# Patient Record
Sex: Male | Born: 1950
Health system: Southern US, Community
[De-identification: ages and names within clinical notes are randomized; demographics above are authoritative.]

## PROBLEM LIST (undated history)

## (undated) DIAGNOSIS — K5090 Crohn's disease, unspecified, without complications: Secondary | ICD-10-CM

## (undated) DIAGNOSIS — IMO0002 Reserved for concepts with insufficient information to code with codable children: Secondary | ICD-10-CM

## (undated) DIAGNOSIS — C4491 Basal cell carcinoma of skin, unspecified: Secondary | ICD-10-CM

## (undated) DIAGNOSIS — E538 Deficiency of other specified B group vitamins: Secondary | ICD-10-CM

## (undated) DIAGNOSIS — H8390 Unspecified disease of inner ear, unspecified ear: Secondary | ICD-10-CM

## (undated) DIAGNOSIS — T7840XA Allergy, unspecified, initial encounter: Secondary | ICD-10-CM

## (undated) DIAGNOSIS — K9089 Other intestinal malabsorption: Secondary | ICD-10-CM

## (undated) DIAGNOSIS — N200 Calculus of kidney: Secondary | ICD-10-CM

## (undated) DIAGNOSIS — I10 Essential (primary) hypertension: Secondary | ICD-10-CM

## (undated) DIAGNOSIS — H9190 Unspecified hearing loss, unspecified ear: Secondary | ICD-10-CM

## (undated) DIAGNOSIS — Z8719 Personal history of other diseases of the digestive system: Secondary | ICD-10-CM

## (undated) DIAGNOSIS — D649 Anemia, unspecified: Secondary | ICD-10-CM

## (undated) DIAGNOSIS — K909 Intestinal malabsorption, unspecified: Secondary | ICD-10-CM

## (undated) DIAGNOSIS — L409 Psoriasis, unspecified: Secondary | ICD-10-CM

## (undated) HISTORY — PX: KIDNEY STONE SURGERY: SHX686

## (undated) HISTORY — DX: Basal cell carcinoma of skin, unspecified: C44.91

## (undated) HISTORY — DX: Psoriasis, unspecified: L40.9

## (undated) HISTORY — DX: Intestinal malabsorption, unspecified: K90.9

## (undated) HISTORY — DX: Unspecified hearing loss, unspecified ear: H91.90

## (undated) HISTORY — DX: Unspecified disease of inner ear, unspecified ear: H83.90

## (undated) HISTORY — DX: Essential (primary) hypertension: I10

## (undated) HISTORY — DX: Personal history of other diseases of the digestive system: Z87.19

## (undated) HISTORY — PX: CATARACT EXTRACTION, BILATERAL: SHX1313

## (undated) HISTORY — DX: Crohn's disease, unspecified, without complications: K50.90

## (undated) HISTORY — PX: MOUTH SURGERY: SHX715

## (undated) HISTORY — DX: Other intestinal malabsorption: K90.89

## (undated) HISTORY — DX: Anemia, unspecified: D64.9

## (undated) HISTORY — DX: Reserved for concepts with insufficient information to code with codable children: IMO0002

## (undated) HISTORY — DX: Deficiency of other specified B group vitamins: E53.8

## (undated) HISTORY — DX: Calculus of kidney: N20.0

## (undated) HISTORY — DX: Allergy, unspecified, initial encounter: T78.40XA

## (undated) HISTORY — PX: MOLE REMOVAL: SHX2046

---

## 1987-06-26 DIAGNOSIS — K5090 Crohn's disease, unspecified, without complications: Secondary | ICD-10-CM | POA: Insufficient documentation

## 1987-06-26 HISTORY — PX: ILEOCECETOMY: SHX5857

## 1988-06-25 DIAGNOSIS — K260 Acute duodenal ulcer with hemorrhage: Secondary | ICD-10-CM | POA: Insufficient documentation

## 1997-10-23 LAB — FECAL OCCULT BLOOD, GUAIAC: Fecal Occult Blood: NEGATIVE

## 1997-11-02 ENCOUNTER — Encounter: Payer: Self-pay | Admitting: Internal Medicine

## 1998-01-03 ENCOUNTER — Encounter: Payer: Self-pay | Admitting: Internal Medicine

## 1999-03-21 ENCOUNTER — Encounter: Payer: Self-pay | Admitting: Internal Medicine

## 2000-05-25 DIAGNOSIS — D518 Other vitamin B12 deficiency anemias: Secondary | ICD-10-CM

## 2000-05-29 ENCOUNTER — Encounter: Payer: Self-pay | Admitting: Internal Medicine

## 2000-06-06 ENCOUNTER — Other Ambulatory Visit: Admission: RE | Admit: 2000-06-06 | Discharge: 2000-06-06 | Payer: Self-pay | Admitting: Internal Medicine

## 2000-06-06 ENCOUNTER — Encounter: Payer: Self-pay | Admitting: Internal Medicine

## 2000-06-06 ENCOUNTER — Encounter (INDEPENDENT_AMBULATORY_CARE_PROVIDER_SITE_OTHER): Payer: Self-pay | Admitting: *Deleted

## 2000-06-06 ENCOUNTER — Encounter (INDEPENDENT_AMBULATORY_CARE_PROVIDER_SITE_OTHER): Payer: Self-pay | Admitting: Specialist

## 2000-07-18 ENCOUNTER — Encounter: Payer: Self-pay | Admitting: Internal Medicine

## 2001-07-03 ENCOUNTER — Encounter: Payer: Self-pay | Admitting: Internal Medicine

## 2002-07-02 ENCOUNTER — Encounter: Payer: Self-pay | Admitting: Internal Medicine

## 2003-07-02 ENCOUNTER — Encounter: Payer: Self-pay | Admitting: Internal Medicine

## 2004-05-04 ENCOUNTER — Ambulatory Visit: Payer: Self-pay | Admitting: Family Medicine

## 2004-05-25 HISTORY — PX: SEPTOPLASTY: SUR1290

## 2004-06-05 ENCOUNTER — Ambulatory Visit: Payer: Self-pay | Admitting: Family Medicine

## 2004-06-21 ENCOUNTER — Ambulatory Visit: Payer: Self-pay | Admitting: Otolaryngology

## 2004-06-30 ENCOUNTER — Ambulatory Visit: Payer: Self-pay | Admitting: Family Medicine

## 2004-08-03 ENCOUNTER — Ambulatory Visit: Payer: Self-pay | Admitting: Family Medicine

## 2004-08-14 ENCOUNTER — Ambulatory Visit: Payer: Self-pay | Admitting: Internal Medicine

## 2004-09-05 ENCOUNTER — Ambulatory Visit: Payer: Self-pay | Admitting: Family Medicine

## 2004-09-29 ENCOUNTER — Ambulatory Visit: Payer: Self-pay | Admitting: Family Medicine

## 2004-10-30 ENCOUNTER — Ambulatory Visit: Payer: Self-pay | Admitting: Family Medicine

## 2004-11-30 ENCOUNTER — Ambulatory Visit: Payer: Self-pay | Admitting: Family Medicine

## 2005-01-01 ENCOUNTER — Ambulatory Visit: Payer: Self-pay | Admitting: Family Medicine

## 2005-02-01 ENCOUNTER — Ambulatory Visit: Payer: Self-pay | Admitting: Family Medicine

## 2005-03-01 ENCOUNTER — Ambulatory Visit: Payer: Self-pay | Admitting: Family Medicine

## 2005-03-30 ENCOUNTER — Ambulatory Visit: Payer: Self-pay | Admitting: Family Medicine

## 2005-04-30 ENCOUNTER — Ambulatory Visit: Payer: Self-pay | Admitting: Family Medicine

## 2005-05-30 ENCOUNTER — Ambulatory Visit: Payer: Self-pay | Admitting: Family Medicine

## 2005-07-04 ENCOUNTER — Ambulatory Visit: Payer: Self-pay | Admitting: Family Medicine

## 2005-07-31 ENCOUNTER — Ambulatory Visit: Payer: Self-pay | Admitting: Family Medicine

## 2005-08-14 ENCOUNTER — Ambulatory Visit: Payer: Self-pay | Admitting: Internal Medicine

## 2005-08-29 ENCOUNTER — Ambulatory Visit: Payer: Self-pay | Admitting: Family Medicine

## 2005-10-03 ENCOUNTER — Ambulatory Visit: Payer: Self-pay | Admitting: Family Medicine

## 2005-10-25 ENCOUNTER — Ambulatory Visit: Payer: Self-pay | Admitting: Family Medicine

## 2005-11-01 ENCOUNTER — Ambulatory Visit: Payer: Self-pay | Admitting: Family Medicine

## 2005-11-07 ENCOUNTER — Ambulatory Visit: Payer: Self-pay | Admitting: Family Medicine

## 2005-11-22 ENCOUNTER — Ambulatory Visit: Payer: Self-pay | Admitting: Family Medicine

## 2005-12-07 ENCOUNTER — Ambulatory Visit: Payer: Self-pay | Admitting: Family Medicine

## 2005-12-12 ENCOUNTER — Ambulatory Visit: Payer: Self-pay | Admitting: Family Medicine

## 2005-12-12 ENCOUNTER — Ambulatory Visit: Payer: Self-pay | Admitting: Internal Medicine

## 2005-12-17 ENCOUNTER — Ambulatory Visit: Payer: Self-pay | Admitting: Internal Medicine

## 2005-12-17 LAB — HM COLONOSCOPY

## 2005-12-24 ENCOUNTER — Ambulatory Visit: Payer: Self-pay | Admitting: Family Medicine

## 2006-01-07 ENCOUNTER — Ambulatory Visit: Payer: Self-pay | Admitting: Family Medicine

## 2006-01-22 ENCOUNTER — Ambulatory Visit: Payer: Self-pay | Admitting: Family Medicine

## 2006-02-07 ENCOUNTER — Ambulatory Visit: Payer: Self-pay | Admitting: Family Medicine

## 2006-02-26 ENCOUNTER — Ambulatory Visit: Payer: Self-pay | Admitting: Family Medicine

## 2006-02-28 ENCOUNTER — Ambulatory Visit: Payer: Self-pay | Admitting: Family Medicine

## 2006-03-11 ENCOUNTER — Ambulatory Visit: Payer: Self-pay | Admitting: Family Medicine

## 2006-03-26 ENCOUNTER — Ambulatory Visit: Payer: Self-pay | Admitting: *Deleted

## 2006-04-09 ENCOUNTER — Ambulatory Visit: Payer: Self-pay | Admitting: Family Medicine

## 2006-04-24 ENCOUNTER — Ambulatory Visit: Payer: Self-pay | Admitting: Family Medicine

## 2006-05-14 ENCOUNTER — Ambulatory Visit: Payer: Self-pay | Admitting: Family Medicine

## 2006-05-27 ENCOUNTER — Ambulatory Visit: Payer: Self-pay | Admitting: Family Medicine

## 2006-06-11 ENCOUNTER — Ambulatory Visit: Payer: Self-pay | Admitting: Family Medicine

## 2006-06-26 ENCOUNTER — Ambulatory Visit: Payer: Self-pay | Admitting: Family Medicine

## 2006-07-11 ENCOUNTER — Ambulatory Visit: Payer: Self-pay | Admitting: Family Medicine

## 2006-07-29 ENCOUNTER — Ambulatory Visit: Payer: Self-pay | Admitting: Family Medicine

## 2006-08-16 ENCOUNTER — Ambulatory Visit: Payer: Self-pay | Admitting: Family Medicine

## 2006-09-12 ENCOUNTER — Encounter: Payer: Self-pay | Admitting: Family Medicine

## 2006-09-16 ENCOUNTER — Ambulatory Visit: Payer: Self-pay | Admitting: Family Medicine

## 2006-10-03 ENCOUNTER — Ambulatory Visit: Payer: Self-pay | Admitting: Family Medicine

## 2006-10-21 ENCOUNTER — Ambulatory Visit: Payer: Self-pay | Admitting: Family Medicine

## 2006-11-29 ENCOUNTER — Ambulatory Visit: Payer: Self-pay | Admitting: Internal Medicine

## 2006-11-29 LAB — CONVERTED CEMR LAB
ALT: 16 units/L (ref 0–40)
AST: 18 units/L (ref 0–37)
Albumin: 3.7 g/dL (ref 3.5–5.2)
Basophils Absolute: 0 10*3/uL (ref 0.0–0.1)
CRP, High Sensitivity: <1 — ABNORMAL LOW (ref 0.00–5.00)
Chloride: 108 meq/L (ref 96–112)
Creatinine, Ser: 0.9 mg/dL (ref 0.4–1.5)
Eosinophils Absolute: 0.2 10*3/uL (ref 0.0–0.6)
GFR calc Af Amer: 112 mL/min
Glucose, Bld: 84 mg/dL (ref 70–99)
HCT: 39.7 % (ref 39.0–52.0)
MCHC: 34.3 g/dL (ref 30.0–36.0)
MCV: 87.5 fL (ref 78.0–100.0)
Monocytes Relative: 12.3 % — ABNORMAL HIGH (ref 3.0–11.0)
Neutrophils Relative %: 67.3 % (ref 43.0–77.0)
Potassium: 4.3 meq/L (ref 3.5–5.1)
RBC: 4.54 M/uL (ref 4.22–5.81)
RDW: 14.4 % (ref 11.5–14.6)
Sed Rate: 10 mm/hr (ref 0–20)
Sodium: 140 meq/L (ref 135–145)
Total Bilirubin: 0.9 mg/dL (ref 0.3–1.2)

## 2006-12-06 ENCOUNTER — Ambulatory Visit: Payer: Self-pay | Admitting: Family Medicine

## 2006-12-24 ENCOUNTER — Ambulatory Visit: Payer: Self-pay | Admitting: Family Medicine

## 2007-01-09 ENCOUNTER — Ambulatory Visit: Payer: Self-pay | Admitting: Family Medicine

## 2007-01-30 ENCOUNTER — Ambulatory Visit: Payer: Self-pay | Admitting: Family Medicine

## 2007-02-03 ENCOUNTER — Encounter: Payer: Self-pay | Admitting: Family Medicine

## 2007-03-07 ENCOUNTER — Ambulatory Visit: Payer: Self-pay | Admitting: Family Medicine

## 2007-03-28 ENCOUNTER — Ambulatory Visit: Payer: Self-pay | Admitting: Family Medicine

## 2007-04-15 ENCOUNTER — Ambulatory Visit: Payer: Self-pay | Admitting: Family Medicine

## 2007-05-01 ENCOUNTER — Ambulatory Visit: Payer: Self-pay | Admitting: Family Medicine

## 2007-05-19 ENCOUNTER — Ambulatory Visit: Payer: Self-pay | Admitting: Family Medicine

## 2007-05-19 DIAGNOSIS — I10 Essential (primary) hypertension: Secondary | ICD-10-CM

## 2007-06-06 ENCOUNTER — Ambulatory Visit: Payer: Self-pay | Admitting: Family Medicine

## 2007-06-27 ENCOUNTER — Ambulatory Visit: Payer: Self-pay | Admitting: Family Medicine

## 2007-06-29 LAB — CONVERTED CEMR LAB
Calcium: 8.9 mg/dL (ref 8.4–10.5)
Chloride: 107 meq/L (ref 96–112)
Creatinine, Ser: 1.1 mg/dL (ref 0.4–1.5)
GFR calc non Af Amer: 73 mL/min

## 2007-07-02 ENCOUNTER — Ambulatory Visit: Payer: Self-pay | Admitting: Family Medicine

## 2007-08-01 ENCOUNTER — Ambulatory Visit: Payer: Self-pay | Admitting: Family Medicine

## 2007-09-11 ENCOUNTER — Ambulatory Visit: Payer: Self-pay | Admitting: Family Medicine

## 2007-10-22 ENCOUNTER — Ambulatory Visit: Payer: Self-pay | Admitting: Family Medicine

## 2007-11-10 ENCOUNTER — Ambulatory Visit: Payer: Self-pay | Admitting: Family Medicine

## 2007-11-10 DIAGNOSIS — K5080 Crohn's disease of both small and large intestine without complications: Secondary | ICD-10-CM

## 2007-11-10 LAB — CONVERTED CEMR LAB
AST: 20 units/L (ref 0–37)
Basophils Absolute: 0 10*3/uL (ref 0.0–0.1)
Basophils Relative: 0.2 % (ref 0.0–1.0)
Bilirubin, Direct: 0.1 mg/dL (ref 0.0–0.3)
Chloride: 110 meq/L (ref 96–112)
Cholesterol: 117 mg/dL (ref 0–200)
Creatinine, Ser: 1 mg/dL (ref 0.4–1.5)
Eosinophils Absolute: 0.1 10*3/uL (ref 0.0–0.7)
Ferritin: 23 ng/mL (ref 22.0–322.0)
GFR calc non Af Amer: 82 mL/min
HDL: 31.9 mg/dL — ABNORMAL LOW (ref >39.0)
Iron: 82 ug/dL (ref 42–165)
LDL Cholesterol: 58 mg/dL (ref 0–99)
Lymphocytes Relative: 16.9 % (ref 12.0–46.0)
MCHC: 34.1 g/dL (ref 30.0–36.0)
MCV: 91.6 fL (ref 78.0–100.0)
Neutrophils Relative %: 68.6 % (ref 43.0–77.0)
Platelets: 197 10*3/uL (ref 150–400)
Potassium: 4.1 meq/L (ref 3.5–5.1)
RDW: 12.6 % (ref 11.5–14.6)
Sodium: 141 meq/L (ref 135–145)
Total Bilirubin: 1.2 mg/dL (ref 0.3–1.2)
VLDL: 27 mg/dL (ref 0–40)
Vitamin B-12: 433 pg/mL (ref 211–911)

## 2007-11-12 ENCOUNTER — Ambulatory Visit: Payer: Self-pay | Admitting: Family Medicine

## 2007-11-12 DIAGNOSIS — D509 Iron deficiency anemia, unspecified: Secondary | ICD-10-CM

## 2007-11-25 ENCOUNTER — Ambulatory Visit: Payer: Self-pay | Admitting: Family Medicine

## 2007-12-02 ENCOUNTER — Telehealth: Payer: Self-pay | Admitting: Internal Medicine

## 2007-12-17 ENCOUNTER — Ambulatory Visit: Payer: Self-pay | Admitting: Internal Medicine

## 2007-12-17 DIAGNOSIS — K9089 Other intestinal malabsorption: Secondary | ICD-10-CM

## 2008-01-06 ENCOUNTER — Ambulatory Visit: Payer: Self-pay | Admitting: Family Medicine

## 2008-01-23 ENCOUNTER — Ambulatory Visit: Payer: Self-pay | Admitting: Family Medicine

## 2008-02-12 ENCOUNTER — Ambulatory Visit: Payer: Self-pay | Admitting: Family Medicine

## 2008-03-10 ENCOUNTER — Ambulatory Visit: Payer: Self-pay | Admitting: Family Medicine

## 2008-03-26 ENCOUNTER — Ambulatory Visit: Payer: Self-pay | Admitting: Family Medicine

## 2008-04-16 ENCOUNTER — Ambulatory Visit: Payer: Self-pay | Admitting: Family Medicine

## 2008-05-13 ENCOUNTER — Ambulatory Visit: Payer: Self-pay | Admitting: Family Medicine

## 2008-06-04 ENCOUNTER — Ambulatory Visit: Payer: Self-pay | Admitting: Family Medicine

## 2008-07-01 ENCOUNTER — Ambulatory Visit: Payer: Self-pay | Admitting: Family Medicine

## 2008-07-05 ENCOUNTER — Telehealth: Payer: Self-pay | Admitting: Internal Medicine

## 2008-07-06 ENCOUNTER — Telehealth: Payer: Self-pay | Admitting: Internal Medicine

## 2008-08-10 ENCOUNTER — Ambulatory Visit: Payer: Self-pay | Admitting: Family Medicine

## 2008-09-08 ENCOUNTER — Ambulatory Visit: Payer: Self-pay | Admitting: Family Medicine

## 2008-09-20 ENCOUNTER — Emergency Department (HOSPITAL_COMMUNITY): Admission: EM | Admit: 2008-09-20 | Discharge: 2008-09-20 | Payer: Self-pay | Admitting: Emergency Medicine

## 2008-09-20 ENCOUNTER — Encounter (INDEPENDENT_AMBULATORY_CARE_PROVIDER_SITE_OTHER): Payer: Self-pay | Admitting: *Deleted

## 2008-09-20 ENCOUNTER — Telehealth (INDEPENDENT_AMBULATORY_CARE_PROVIDER_SITE_OTHER): Payer: Self-pay | Admitting: *Deleted

## 2008-09-20 ENCOUNTER — Telehealth: Payer: Self-pay | Admitting: Internal Medicine

## 2008-09-21 ENCOUNTER — Telehealth: Payer: Self-pay | Admitting: Internal Medicine

## 2008-09-30 ENCOUNTER — Ambulatory Visit: Payer: Self-pay | Admitting: Internal Medicine

## 2008-09-30 DIAGNOSIS — K56609 Unspecified intestinal obstruction, unspecified as to partial versus complete obstruction: Secondary | ICD-10-CM | POA: Insufficient documentation

## 2008-11-02 ENCOUNTER — Ambulatory Visit: Payer: Self-pay | Admitting: Family Medicine

## 2008-12-08 ENCOUNTER — Ambulatory Visit: Payer: Self-pay | Admitting: Family Medicine

## 2008-12-23 ENCOUNTER — Ambulatory Visit: Payer: Self-pay | Admitting: Family Medicine

## 2009-01-13 ENCOUNTER — Ambulatory Visit: Payer: Self-pay | Admitting: Family Medicine

## 2009-01-27 ENCOUNTER — Ambulatory Visit: Payer: Self-pay | Admitting: Family Medicine

## 2009-02-25 ENCOUNTER — Ambulatory Visit: Payer: Self-pay | Admitting: Family Medicine

## 2009-03-22 ENCOUNTER — Ambulatory Visit: Payer: Self-pay | Admitting: Family Medicine

## 2009-04-11 ENCOUNTER — Ambulatory Visit: Payer: Self-pay | Admitting: Family Medicine

## 2009-05-13 ENCOUNTER — Ambulatory Visit: Payer: Self-pay | Admitting: Family Medicine

## 2009-05-26 ENCOUNTER — Ambulatory Visit: Payer: Self-pay | Admitting: Family Medicine

## 2009-06-13 ENCOUNTER — Telehealth: Payer: Self-pay | Admitting: Family Medicine

## 2009-06-30 ENCOUNTER — Ambulatory Visit: Payer: Self-pay | Admitting: Family Medicine

## 2009-08-03 ENCOUNTER — Ambulatory Visit: Payer: Self-pay | Admitting: Family Medicine

## 2009-08-26 ENCOUNTER — Ambulatory Visit: Payer: Self-pay | Admitting: Family Medicine

## 2009-09-08 ENCOUNTER — Ambulatory Visit: Payer: Self-pay | Admitting: Family Medicine

## 2009-09-22 ENCOUNTER — Ambulatory Visit: Payer: Self-pay | Admitting: Family Medicine

## 2009-10-04 ENCOUNTER — Telehealth: Payer: Self-pay | Admitting: Family Medicine

## 2009-10-04 DIAGNOSIS — E785 Hyperlipidemia, unspecified: Secondary | ICD-10-CM

## 2009-10-07 ENCOUNTER — Ambulatory Visit: Payer: Self-pay | Admitting: Family Medicine

## 2009-10-21 ENCOUNTER — Ambulatory Visit: Payer: Self-pay | Admitting: Family Medicine

## 2009-11-08 ENCOUNTER — Ambulatory Visit: Payer: Self-pay | Admitting: Family Medicine

## 2009-11-11 ENCOUNTER — Ambulatory Visit: Payer: Self-pay | Admitting: Family Medicine

## 2009-11-13 LAB — CONVERTED CEMR LAB
ALT: 17 units/L (ref 0–53)
Alkaline Phosphatase: 67 units/L (ref 39–117)
Basophils Relative: 0.3 % (ref 0.0–3.0)
Bilirubin, Direct: 0.2 mg/dL (ref 0.0–0.3)
Calcium: 8.9 mg/dL (ref 8.4–10.5)
Chloride: 104 meq/L (ref 96–112)
Creatinine, Ser: 1 mg/dL (ref 0.4–1.5)
Eosinophils Relative: 1.7 % (ref 0.0–5.0)
HDL: 47.8 mg/dL (ref >39.00)
Lymphocytes Relative: 16.2 % (ref 12.0–46.0)
MCV: 88.9 fL (ref 78.0–100.0)
Neutrophils Relative %: 71.6 % (ref 43.0–77.0)
PSA: 0.73 ng/mL (ref 0.10–4.00)
RBC: 4.2 M/uL — ABNORMAL LOW (ref 4.22–5.81)
Sodium: 140 meq/L (ref 135–145)
Total Protein: 6.8 g/dL (ref 6.0–8.3)
Triglycerides: 203 mg/dL — ABNORMAL HIGH (ref 0.0–149.0)
VLDL: 40.6 mg/dL — ABNORMAL HIGH (ref 0.0–40.0)
Vit D, 25-Hydroxy: 19 ng/mL — ABNORMAL LOW (ref 30–89)
WBC: 5.8 10*3/uL (ref 4.5–10.5)

## 2009-11-16 ENCOUNTER — Ambulatory Visit: Payer: Self-pay | Admitting: Family Medicine

## 2009-11-22 ENCOUNTER — Ambulatory Visit: Payer: Self-pay | Admitting: Family Medicine

## 2009-12-06 ENCOUNTER — Ambulatory Visit: Payer: Self-pay | Admitting: Family Medicine

## 2009-12-20 ENCOUNTER — Ambulatory Visit: Payer: Self-pay | Admitting: Family Medicine

## 2009-12-29 ENCOUNTER — Ambulatory Visit: Payer: Self-pay | Admitting: Internal Medicine

## 2010-01-05 ENCOUNTER — Ambulatory Visit: Payer: Self-pay | Admitting: Family Medicine

## 2010-01-19 ENCOUNTER — Ambulatory Visit: Payer: Self-pay | Admitting: Family Medicine

## 2010-01-26 ENCOUNTER — Encounter (INDEPENDENT_AMBULATORY_CARE_PROVIDER_SITE_OTHER): Payer: Self-pay | Admitting: *Deleted

## 2010-02-07 ENCOUNTER — Ambulatory Visit: Payer: Self-pay | Admitting: Family Medicine

## 2010-02-21 ENCOUNTER — Ambulatory Visit: Payer: Self-pay | Admitting: Family Medicine

## 2010-03-01 ENCOUNTER — Telehealth: Payer: Self-pay | Admitting: Internal Medicine

## 2010-03-09 ENCOUNTER — Ambulatory Visit: Payer: Self-pay | Admitting: Family Medicine

## 2010-03-13 ENCOUNTER — Telehealth: Payer: Self-pay | Admitting: Internal Medicine

## 2010-03-23 ENCOUNTER — Ambulatory Visit: Payer: Self-pay | Admitting: Family Medicine

## 2010-04-12 ENCOUNTER — Ambulatory Visit: Payer: Self-pay | Admitting: Family Medicine

## 2010-05-02 ENCOUNTER — Ambulatory Visit: Payer: Self-pay | Admitting: Family Medicine

## 2010-05-12 ENCOUNTER — Ambulatory Visit: Payer: Self-pay | Admitting: Family Medicine

## 2010-05-30 ENCOUNTER — Ambulatory Visit: Payer: Self-pay | Admitting: Family Medicine

## 2010-06-14 ENCOUNTER — Ambulatory Visit: Payer: Self-pay | Admitting: Family Medicine

## 2010-06-30 ENCOUNTER — Ambulatory Visit
Admission: RE | Admit: 2010-06-30 | Discharge: 2010-06-30 | Payer: Self-pay | Source: Home / Self Care | Attending: Family Medicine | Admitting: Family Medicine

## 2010-07-14 ENCOUNTER — Ambulatory Visit
Admission: RE | Admit: 2010-07-14 | Discharge: 2010-07-14 | Payer: Self-pay | Source: Home / Self Care | Attending: Family Medicine | Admitting: Family Medicine

## 2010-07-25 NOTE — Assessment & Plan Note (Signed)
Summary: REFILL MEDICATION/CLE   Vital Signs:  Patient profile:   60 year old male Weight:      185 pounds BMI:     25.18 Temp:     98.4 degrees F oral Pulse rate:   80 / minute Pulse rhythm:   regular BP sitting:   112 / 78  (left arm) Cuff size:   regular  Vitals Entered By: Emelia Salisbury LPN (June 30, 5398 8:36 AM) CC: Needs refill on medication and B-12 shot   History of Present Illness: Paul Atkinson is a 60 y/o caucasian male presenting today for a refill on his medications.  He is tolerating his current medications well. His only complaint is hearing loss, not in the typical high frequency range and has been seen and evaluated by Dr Kathyrn Sheriff. There appears to be nothing to be done at this stage. 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   Problems Prior to Update: 1)  Small Bowel Obstruction  (ICD-560.9) 2)  Diarrhea  (ICD-787.91) 3)  Other Specified Intestinal Malabsorption  (ICD-579.8) 4)  Hx of Rgn Enteritis Small Intestine W/lg Intestine  (ICD-555.2) 5)  Health Maintenance Exam  (ICD-V70.0) 6)  Decreased Hearing, Right Ear (DR JUENGEL)  (ICD-389.9) 7)  Hypertension, Benign Essential  (ICD-401.1) 8)  Ulcer, Acute Duodenal W/hemorrhage w/o Obst  (ICD-532.00) 9)  Anemia, Vitamin B12 Deficiency Nec  (ICD-281.1) 10)  Enteritis, Regional Nos  (ICD-555.9) 11)  Seborrheic Dermatitis/ Dr Allyson Sabal  (ICD-690.10) 12)  Anemia-iron Deficiency  (ICD-280.9)  Medications Prior to Update: 1)  Coreg 3.125 Mg  Tabs (Carvedilol) .... One Tab By Mouth Two Times A Day 2)  Nasonex 50 Mcg/act  Susp (Mometasone Furoate) .Marland Kitchen.. 1 Puff in Each Nostril Daily As Needed 3)  Cyanocobalamin 1000 Mcg/ml Inj Soln (Cyanocobalamin) .... Inject 1 Ml Im Every 2 Weeks 4)  Colestid 1 Gm Tabs (Colestipol Hcl) .... Take 1 Tablet By Mouth Four Times A Day 5)  Multivitamins   Tabs (Multiple Vitamin) .... Take 1 Tablet By Mouth Once A Day  Allergies: 1)  ! Penicillin  Physical  Exam  General:  Well developed, well nourished, no acute distress. Head:  Normocephalic and atraumatic. Eyes:  Conjunctiva clear bilaterally.  Ears:  External ear exam shows no significant lesions or deformities.  Otoscopic examination reveals clear canals, tympanic membranes are intact bilaterally without bulging, retraction, inflammation or discharge. Hearing is grossly normal bilaterally. Nose:  No deformity, discharge,  or lesions. Mouth:  No deformity or lesions, dentition normal. Neck:  Supple; no masses or thyromegaly. Chest Wall:  No deformities, masses, tenderness or gynecomastia noted. Lungs:  Clear throughout to auscultation. Heart:  Regular rate and rhythm; no murmurs, rubs,  or bruits. Abdomen:  Soft, nontender and nondistended. No masses, hepatosplenomegaly or hernias noted. Normal bowel sounds.prior surgical incisions well-healed   Impression & Recommendations:  Problem # 1:  HYPERTENSION, BENIGN ESSENTIAL (ICD-401.1) Assessment Unchanged Stable, script written. His updated medication list for this problem includes:    Coreg 3.125 Mg Tabs (Carvedilol) ..... One tab by mouth two times a day  BP today: 112/78 Prior BP: 114/68 (09/30/2008)  Labs Reviewed: K+: 4.1 (11/10/2007) Creat: : 1.0 (11/10/2007)   Chol: 117 (11/10/2007)   HDL: 31.9 (11/10/2007)   LDL: 58 (11/10/2007)   TG: 137 (11/10/2007)  Problem # 2:  DECREASED HEARING, RIGHT EAR (DR JUENGEL) (ICD-389.9) Assessment: Unchanged Per Dr Kathyrn Sheriff.  Problem # 3:  Hx of RGN ENTERITIS SMALL INTESTINE W/LG INTESTINE (  ICD-555.2) Assessment: Unchanged Sees Dr Henrene Pastor yearly for the rest of his listed problems. Cont.  Complete Medication List: 1)  Coreg 3.125 Mg Tabs (Carvedilol) .... One tab by mouth two times a day 2)  Nasonex 50 Mcg/act Susp (Mometasone furoate) .Marland Kitchen.. 1 puff in each nostril daily as needed 3)  Cyanocobalamin 1000 Mcg/ml Inj Soln (Cyanocobalamin) .... Inject 1 ml im every 2 weeks 4)  Colestid 1 Gm  Tabs (Colestipol hcl) .... Take 1 tablet by mouth four times a day 5)  Multivitamins Tabs (Multiple vitamin) .... Take 1 tablet by mouth once a day  Other Orders: Vit B12 1000 mcg (J3420) Admin of Therapeutic Inj  intramuscular or subcutaneous (34356)  Patient Instructions: 1)  RTC early May for Comp Exam, labs prior, needs Bmet, Chol, Hepatic, TSH, etc. Prescriptions: COREG 3.125 MG  TABS (CARVEDILOL) one tab by mouth two times a day  #60 Tablet x 11   Entered by:   Emelia Salisbury LPN   Authorized by:   Raenette Rover MD   Signed by:   Emelia Salisbury LPN on 86/16/8372   Method used:   Print then Give to Patient   RxID:   [id number]   Current Allergies (reviewed today): ! PENICILLIN   Medication Administration  Injection # 1:    Medication: Vit B12 1000 mcg    Diagnosis: ANEMIA, VITAMIN B12 DEFICIENCY NEC (ICD-281.1)    Route: IM    Site: L deltoid    Exp Date: 02/24/2011    Lot #: [id number]    Mfr: [place]    Patient tolerated injection without complications    Given by: Emelia Salisbury LPN (July 01, 4495 8:45 AM)  Orders Added: 1)  Vit B12 1000 mcg [J3420] 2)  Admin of Therapeutic Inj  intramuscular or subcutaneous [96372] 3)  Est. Patient Level III [53005]

## 2010-07-25 NOTE — Assessment & Plan Note (Signed)
Summary: B-12  Nurse Visit   Allergies: 1)  ! Penicillin  Medication Administration  Injection # 1:    Medication: Vit B12 1000 mcg    Diagnosis: ANEMIA, VITAMIN B12 DEFICIENCY NEC (ICD-281.1)    Route: IM    Site: L deltoid    Exp Date: 04/26/2011    Lot #: [id number]    Mfr: [place]    Patient tolerated injection without complications    Given by: Sherrian Divers CMA Deborra Medina) (October 21, 2009 8:19 AM)  Orders Added: 1)  Vit B12 1000 mcg [J3420] 2)  Admin of Therapeutic Inj  intramuscular or subcutaneous [[id number]]

## 2010-07-25 NOTE — Letter (Signed)
Summary: [place]  [place]   Imported By: Phillis Knack 12/30/2009 07:55:41  _____________________________________________________________________  External Attachment:    Type:   Image     Comment:   External Document

## 2010-07-25 NOTE — Letter (Signed)
Summary: [facility]  [place]   Imported By: Phillis Knack 12/30/2009 07:50:54  _____________________________________________________________________  External Attachment:    Type:   Image     Comment:   External Document

## 2010-07-25 NOTE — Assessment & Plan Note (Signed)
Summary: ANNUAL.Marland KitchenMarland KitchenCrohn's ileocolitis   History of Present Illness Visit Type: Follow-up Visit Primary GI MD: Scarlette Shorts MD Primary Provider: Teresa Pelton, MD Requesting Provider: na Chief Complaint: Yearly f/u for Crohn's. Pt states that he is doing great and denies any GI complaints  History of Present Illness:   60 year old white male with history of Crohn's ileocolitis or which she is status post ileocecectomy greater than 20 years ago. He also has a history of bile salt induced diarrhea for which he takes Colestid. B12 deficiency for which he takes replacement therapy. Also history of hypertension. He was last seen April 2010. Since that time he reports doing well. No active GI complaints. His last colonoscopy was in 2007 showed a few focal anastomotic ulcers that were small. Otherwise negative. Review of outside laboratories from May 2011 reveals mild anemia with hemoglobin of 12.7. Normal B12 and iron level. Normal liver function tests. Diminished vitamin D level forward he reports to be on replacement. He requests refill of Colestid.Marland Kitchen   GI Review of Systems      Denies abdominal pain, acid reflux, belching, bloating, chest pain, dysphagia with liquids, dysphagia with solids, heartburn, loss of appetite, nausea, vomiting, vomiting blood, weight loss, and  weight gain.        Denies anal fissure, black tarry stools, change in bowel habit, constipation, diarrhea, diverticulosis, fecal incontinence, heme positive stool, hemorrhoids, irritable bowel syndrome, jaundice, light color stool, liver problems, rectal bleeding, and  rectal pain.    Current Medications (verified): 1)  Coreg 3.125 Mg  Tabs (Carvedilol) .... One Tab By Mouth Two Times A Day 2)  Nasonex 50 Mcg/act  Susp (Mometasone Furoate) .Marland Kitchen.. 1 Puff in Each Nostril Daily As Needed 3)  Cyanocobalamin 1000 Mcg/ml Inj Soln (Cyanocobalamin) .... Inject 1 Ml Im Every 2 Weeks 4)  Colestid 1 Gm Tabs (Colestipol Hcl) .... Take 1 Tablet  By Mouth Four Times A Day 5)  Multivitamins   Tabs (Multiple Vitamin) .... Take 1 Tablet By Mouth Once A Day 6)  Vitamin D 1000 Unit  Tabs (Cholecalciferol) .... One Tablet By Mouth Two Times A Day  Allergies (verified): 1)  ! Penicillin  Past History:  Past Medical History: Reviewed history from 12/17/2007 and no changes required. Crohns Disease status post ileocecal ectomy 1989 B12 deficiency secondary to ileal resection Bile salt induced diarrhea Hypertension  Past Surgical History: Reviewed history from 11/16/2009 and no changes required. status post ileocecectomy septoplasty 2nd todev. septum (DR. Myna Hidalgo) 05/2004 Oral Surgery  Family History: Reviewed history from 11/16/2009 and no changes required. Father dec 90  End stageRenal Fail MI Stents DM on insulin Chol  Mother dec 74 CHF Kidney Fail HTN Arrhythmias Brother A 61 Musculoskel/ Joint  probs Back Surg x2 Sister A 13 Sister A 50 No FH of Colon Cancer Family History of Diabetes: Father Family History of Heart Disease: Father, Mother Family History of Kidney Disease: Father  Social History: Reviewed history from 09/30/2008 and no changes required. Occupation: Insurance Married Lives  2 grown children Patient has never smoked.  Alcohol Use - no Illicit Drug Use - no Patient does not get regular exercise.  Daily Caffeine Use 3-4 Dt. Pepsi-can   Review of Systems  The patient denies allergy/sinus, anemia, anxiety-new, arthritis/joint pain, back pain, blood in urine, breast changes/lumps, change in vision, confusion, cough, coughing up blood, depression-new, fainting, fatigue, fever, headaches-new, hearing problems, heart murmur, heart rhythm changes, itching, menstrual pain, muscle pains/cramps, night sweats, nosebleeds, pregnancy symptoms, shortness of breath,  skin rash, sleeping problems, sore throat, swelling of feet/legs, swollen lymph glands, thirst - excessive , urination - excessive , urination changes/pain,  urine leakage, vision changes, and voice change.    Vital Signs:  Patient profile:   60 year old male Height:      72 inches Weight:      184 pounds BMI:     25.05 BSA:     2.06 Pulse rate:   64 / minute Pulse rhythm:   regular BP sitting:   122 / 74  (left arm) Cuff size:   regular  Vitals Entered By: Hope Pigeon CMA (December 29, 2009 1:35 PM)  Physical Exam  General:  Well developed, well nourished, no acute distress. Head:  Normocephalic and atraumatic. Eyes:  PERRLA, no icterus. Ears:  Normal auditory acuity. Nose:  No deformity, discharge,  or lesions. Mouth:  No deformity or lesions, dentition normal. Neck:  Supple; no masses or thyromegaly. Lungs:  Clear throughout to auscultation. Heart:  Regular rate and rhythm; no murmurs, rubs,  or bruits. Abdomen:  Soft, nontender and nondistended. No masses, hepatosplenomegaly or hernias noted. Normal bowel sounds.. Prior surgical incision well healed Msk:  Symmetrical with no gross deformities. Normal posture. Pulses:  Normal pulses noted. Extremities:  No clubbing, cyanosis, edema or deformities noted. Neurologic:  Alert and  oriented x4; Skin:  erythema and mild fissuring of the fingertips Psych:  Alert and cooperative. Normal mood and affect.   Impression & Recommendations:  Problem # 1:  Hx of RGN ENTERITIS SMALL INTESTINE W/LG INTESTINE (ICD-555.2) Crohn's ileocolitis status post ileocecectomy. Clinically well. Last colonoscopy in 2007 as described.  Plan: #1. Repeat colonoscopy in 2012 #2. Routine office followup in one year  Problem # 2:  DIARRHEA (ICD-787.91) bile salt induced diarrhea. Controlled with Colestid.  Plan: #1. Refill Colestid  Problem # 3:  ANEMIA-IRON DEFICIENCY (ICD-280.9) mild anemia. Known B12 deficiency.. Normal B12 level. Low-normal iron.  Plan: #1. Continue B12 replacement #2. Add daily iron supplement #3. Followup CBC in 6-12 months  Patient Instructions: 1)  Refill Colestid x 1 year.   Rx. sent electronically to your pharmacy for you to pick up. 2)  Please schedule a follow-up appointment in 1 year. 3)  The medication list was reviewed and reconciled.  All changed / newly prescribed medications were explained.  A complete medication list was provided to the patient / caregiver. Prescriptions: COLESTID 1 GM TABS (COLESTIPOL HCL) Take 1 tablet by mouth four times a day  #120 Tablet x 11   Entered by:   Randye Lobo NCMA   Authorized by:   Irene Shipper MD   Signed by:   Randye Lobo NCMA on 12/29/2009   Method used:   Electronically to        [facility]. [phone]* (retail)       [address] [place], [place]  [address]       Ph: [phone] or [phone]       Fax: [phone]   RxID:   [id number]

## 2010-07-25 NOTE — Assessment & Plan Note (Signed)
Summary: [facility] 12/[facility]  Nurse Visit   Allergies: 1)  ! Penicillin  Medication Administration  Injection # 1:    Medication: Vit B12 1000 mcg    Diagnosis: ANEMIA, VITAMIN B12 DEFICIENCY NEC (ICD-281.1)    Route: IM    Site: L deltoid    Exp Date: 04/26/2011    Lot #: [id number]    Mfr: [place]    Patient tolerated injection without complications    Given by: Sherrian Divers CMA Deborra Medina) (August 26, 2009 8:43 AM)  Orders Added: 1)  Vit B12 1000 mcg [J3420] 2)  Admin of Therapeutic Inj  intramuscular or subcutaneous [[id number]]

## 2010-07-25 NOTE — Assessment & Plan Note (Signed)
Summary: B-12  Nurse Visit   Allergies: 1)  ! Penicillin  Medication Administration  Injection # 1:    Medication: Vit B12 1000 mcg    Diagnosis: ANEMIA, VITAMIN B12 DEFICIENCY NEC (ICD-281.1)    Route: IM    Site: R deltoid    Exp Date: 05/2011    Lot #: [id number]    Mfr: [place]    Patient tolerated injection without complications    Given by: Marty Heck CMA (December 06, 2009 9:07 AM)  Orders Added: 1)  Vit B12 1000 mcg [J3420] 2)  Admin of Therapeutic Inj  intramuscular or subcutaneous [[id number]]   Medication Administration  Injection # 1:    Medication: Vit B12 1000 mcg    Diagnosis: ANEMIA, VITAMIN B12 DEFICIENCY NEC (ICD-281.1)    Route: IM    Site: R deltoid    Exp Date: 05/2011    Lot #: [id number]    Mfr: [place]    Patient tolerated injection without complications    Given by: Marty Heck CMA (December 06, 2009 9:07 AM)  Orders Added: 1)  Vit B12 1000 mcg [J3420] 2)  Admin of Therapeutic Inj  intramuscular or subcutaneous [[id number]]

## 2010-07-25 NOTE — Assessment & Plan Note (Signed)
Summary: B-12  Nurse Visit   Allergies: 1)  ! Penicillin  Medication Administration  Injection # 1:    Medication: Vit B12 1000 mcg    Diagnosis: ANEMIA, VITAMIN B12 DEFICIENCY NEC (ICD-281.1)    Route: IM    Site: L deltoid    Exp Date: 02/24/2011    Lot #: [id number]    Mfr: [place]    Patient tolerated injection without complications    Given by: Edwin Dada CMA ([place]) (January 05, 2010 8:22 AM)  Orders Added: 1)  Vit B12 1000 mcg [J3420] 2)  Admin of Therapeutic Inj  intramuscular or subcutaneous [[id number]]   Medication Administration  Injection # 1:    Medication: Vit B12 1000 mcg    Diagnosis: ANEMIA, VITAMIN B12 DEFICIENCY NEC (ICD-281.1)    Route: IM    Site: L deltoid    Exp Date: 02/24/2011    Lot #: [id number]    Mfr: [place]    Patient tolerated injection without complications    Given by: Edwin Dada CMA ([place]) (January 05, 2010 8:22 AM)  Orders Added: 1)  Vit B12 1000 mcg [J3420] 2)  Admin of Therapeutic Inj  intramuscular or subcutaneous [[id number]]

## 2010-07-25 NOTE — Progress Notes (Signed)
Summary: wants B12 level checked  Phone Note Call from Patient   Caller: Patient Call For: Raenette Rover MD Summary of Call: Pt is coming in for labs prior to his physical and he is asking that a B12 level be included.   Initial call taken by: Marty Heck CMA,  October 04, 2009 2:53 PM  Follow-up for Phone Call        Will do. Follow-up by: Raenette Rover MD,  October 04, 2009 5:08 PM  New Problems: LOW HDL (ICD-272.5)   New Problems: LOW HDL (ICD-272.5)

## 2010-07-25 NOTE — Assessment & Plan Note (Signed)
Summary: B-12  Nurse Visit   Allergies: 1)  ! Penicillin  Medication Administration  Injection # 1:    Medication: Vit B12 1000 mcg    Diagnosis: ANEMIA-IRON DEFICIENCY (ICD-280.9)    Route: IM    Site: R deltoid    Exp Date: 02/24/2011    Lot #: [id number]    Mfr: [place]    Patient tolerated injection without complications    Given by: Edwin Dada CMA Deborra Medina) (October 07, 2009 8:47 AM)  Orders Added: 1)  Vit B12 1000 mcg [J3420] 2)  Admin of Therapeutic Inj  intramuscular or subcutaneous [96372]   Medication Administration  Injection # 1:    Medication: Vit B12 1000 mcg    Diagnosis: ANEMIA-IRON DEFICIENCY (ICD-280.9)    Route: IM    Site: R deltoid    Exp Date: 02/24/2011    Lot #: [id number]    Mfr: [place]    Patient tolerated injection without complications    Given by: Edwin Dada CMA Deborra Medina) (October 07, 2009 8:47 AM)  Orders Added: 1)  Vit B12 1000 mcg [J3420] 2)  Admin of Therapeutic Inj  intramuscular or subcutaneous [[id number]]

## 2010-07-25 NOTE — Letter (Signed)
Summary: [place]  [place]   Imported By: Phillis Knack 12/30/2009 07:52:37  _____________________________________________________________________  External Attachment:    Type:   Image     Comment:   External Document

## 2010-07-25 NOTE — Assessment & Plan Note (Signed)
Summary: CPX   Vital Signs:  Patient profile:   60 year old male Height:      72 inches Weight:      180.75 pounds Temp:     98.0 degrees F oral Pulse rate:   68 / minute Pulse rhythm:   regular BP sitting:   122 / 82  (left arm) Cuff size:   regular  Vitals Entered By: Emelia Salisbury LPN (Nov 16, 996 3:38 AM) CC: 30 Minute checkup, had a colonoscopy 06/07 by Dr. Henrene Pastor   History of Present Illness: Pt here for Comp Exam, he is on B12 replacement and seems to need higher average levels. He is seeing Dr Henrene Pastor in Jul.  He has complaints only with hand of dryness of the skin.  He uses the phone all the time and the droning in his ears started all at one time and is concerning to him.  Preventive Screening-Counseling & Management  Alcohol-Tobacco     Alcohol drinks/day: 0     Smoking Status: never     Passive Smoke Exposure: no  Caffeine-Diet-Exercise     Caffeine use/day: 3 diet sodas a day.     Does Patient Exercise: no     Type of exercise: irreg hike, yardwork     Times/week: 1  Problems Prior to Update: 1)  Special Screening Malignant Neoplasm of Prostate  (ICD-V76.44) 2)  Low Hdl  (ICD-272.5) 3)  Small Bowel Obstruction  (ICD-560.9) 4)  Diarrhea  (ICD-787.91) 5)  Other Specified Intestinal Malabsorption  (ICD-579.8) 6)  Hx of Rgn Enteritis Small Intestine W/lg Intestine  (ICD-555.2) 7)  Health Maintenance Exam  (ICD-V70.0) 8)  Decreased Hearing, Right Ear (DR JUENGEL)  (ICD-389.9) 9)  Hypertension, Benign Essential  (ICD-401.1) 10)  Ulcer, Acute Duodenal W/hemorrhage w/o Obst  (ICD-532.00) 11)  Anemia, Vitamin B12 Deficiency Nec  (ICD-281.1) 12)  Enteritis, Regional Nos  (ICD-555.9) 13)  Seborrheic Dermatitis/ Dr Allyson Sabal  (ICD-690.10) 14)  Anemia-iron Deficiency  (ICD-280.9)  Medications Prior to Update: 1)  Coreg 3.125 Mg  Tabs (Carvedilol) .... One Tab By Mouth Two Times A Day 2)  Nasonex 50 Mcg/act  Susp (Mometasone Furoate) .Marland Kitchen.. 1 Puff in Each Nostril Daily As  Needed 3)  Cyanocobalamin 1000 Mcg/ml Inj Soln (Cyanocobalamin) .... Inject 1 Ml Im Every 2 Weeks 4)  Colestid 1 Gm Tabs (Colestipol Hcl) .... Take 1 Tablet By Mouth Four Times A Day 5)  Multivitamins   Tabs (Multiple Vitamin) .... Take 1 Tablet By Mouth Once A Day  Allergies: 1)  ! Penicillin  Past History:  Past Medical History: Last updated: 12/17/2007 Crohns Disease status post ileocecal ectomy 1989 B12 deficiency secondary to ileal resection Bile salt induced diarrhea Hypertension  Family History: Last updated: 11/16/2009 Father dec 90  End stageRenal Fail MI Stents DM on insulin Chol  Mother dec 74 CHF Kidney Fail HTN Arrhythmias Brother A 61 Musculoskel/ Joint  probs Back Surg x2 Sister A 23 Sister A 79 No FH of Colon Cancer Family History of Diabetes: Father Family History of Heart Disease: Father, Mother Family History of Kidney Disease: Father  Social History: Last updated: 09/30/2008 Occupation: Insurance Married Lives  2 grown children Patient has never smoked.  Alcohol Use - no Illicit Drug Use - no Patient does not get regular exercise.  Daily Caffeine Use 3-4 Dt. Pepsi-can   Risk Factors: Alcohol Use: 0 (11/16/2009) Caffeine Use: 3 diet sodas a day. (11/16/2009) Exercise: no (11/16/2009)  Risk Factors: Smoking Status: never (11/16/2009) Passive  Smoke Exposure: no (11/16/2009)  Past Surgical History: status post ileocecectomy septoplasty 2nd todev. septum (DR. Myna Hidalgo) 05/2004 Oral Surgery  Family History: Father dec 90  End stageRenal Fail MI Stents DM on insulin Chol  Mother dec 74 CHF Kidney Fail HTN Arrhythmias Brother A 61 Musculoskel/ Joint  probs Back Surg x2 Sister A 25 Sister A 53 No FH of Colon Cancer Family History of Diabetes: Father Family History of Heart Disease: Father, Mother Family History of Kidney Disease: Father  Social History: Caffeine use/day:  3 diet sodas a day.  Review of Systems General:  Denies chills,  fatigue, fever, sweats, weakness, and weight loss. Eyes:  Denies blurring, discharge, eye pain, and light sensitivity; chronic dry eyes. ENT:  Complains of ringing in ears; denies decreased hearing and ear discharge; has background "noise" with mild hearing loss.. CV:  Denies chest pain or discomfort, fainting, fatigue, palpitations, shortness of breath with exertion, swelling of feet, and swelling of hands. Resp:  Denies cough, shortness of breath, and wheezing. GI:  Denies abdominal pain, bloody stools, change in bowel habits, constipation, dark tarry stools, diarrhea, indigestion, loss of appetite, nausea, vomiting, vomiting blood, and yellowish skin color. GU:  Complains of nocturia; denies discharge, dysuria, urinary frequency, and urinary hesitancy; occas. MS:  Complains of cramps; denies joint pain, loss of strength, muscle aches, and stiffness; some . Derm:  Complains of dryness and rash; denies itching; hands. Neuro:  Complains of poor balance; denies numbness, tingling, and tremors; when shooting doeas well sitting or prone, much worse standing.Marland Kitchen  Physical Exam  General:  Well developed, well nourished, no acute distress. Head:  Normocephalic and atraumatic. Eyes:  Conjunctiva clear bilaterally.  Ears:  External ear exam shows no significant lesions or deformities.  Otoscopic examination reveals clear canals, tympanic membranes are intact bilaterally without bulging, retraction, inflammation or discharge. Hearing is grossly normal bilaterally. Nose:  No deformity, discharge,  or lesions. Mouth:  No deformity or lesions, dentition normal. Neck:  Supple; no masses or thyromegaly. Chest Wall:  No deformities, masses, tenderness or gynecomastia noted. Lungs:  Clear throughout to auscultation. Heart:  Regular rate and rhythm; no murmurs, rubs,  or bruits. Abdomen:  Soft, nontender and nondistended. No masses, hepatosplenomegaly or hernias noted. Normal bowel sounds.prior surgical  incisions well-healed Rectal:  No external abnormalities noted. Normal sphincter tone. No rectal masses or tenderness. Sm ext tag, G neg. Genitalia:  Testes bilaterally descended without nodularity, tenderness or masses. No scrotal masses or lesions. No penis lesions or urethral discharge. Prostate:  Prostate gland firm and smooth, no enlargement, nodularity, tenderness, mass, asymmetry or induration. 10gms. Msk:  No deformity or scoliosis noted of thoracic or lumbar spine.   Pulses:  R and L carotid,radial,femoral,dorsalis pedis and posterior tibial pulses are full and equal bilaterally Extremities:  No clubbing, cyanosis, edema, or deformity noted with normal full range of motion of all joints.   Neurologic:  No cranial nerve deficits noted. Station and gait are normal. Plantar reflexes are down-going bilaterally. DTRs are symmetrical throughout. Sensory, motor and coordinative functions appear intact. Skin:  Intact without suspicious lesions or rashes, dryness with fine cracking of the fingers, dry skin in the ears bilat inside the pinnae bilat. Cervical Nodes:  No lymphadenopathy noted Inguinal Nodes:  No significant adenopathy Psych:  Cognition and judgment appear intact. Alert and cooperative with normal attention span and concentration. No apparent delusions, illusions, hallucinations   Impression & Recommendations:  Problem # 1:  HEALTH MAINTENANCE EXAM (ICD-V70.0) Assessment Comment  Only  Reviewed preventive care protocols, scheduled due services, and updated immunizations.  Problem # 2:  SPECIAL SCREENING MALIGNANT NEOPLASM OF PROSTATE (ICD-V76.44) Assessment: Unchanged Stable exam and PSA.  Problem # 3:  LOW HDL (ICD-272.5) Assessment: Unchanged Stable, profile adequate. Exercise will help. His updated medication list for this problem includes:    Colestid 1 Gm Tabs (Colestipol hcl) .Marland Kitchen... Take 1 tablet by mouth four times a day  Labs Reviewed: SGOT: 20 (11/11/2009)   SGPT:  17 (11/11/2009)   HDL:47.80 (11/11/2009), 31.9 (11/10/2007)  LDL:58 (11/10/2007)  Chol:148 (11/11/2009), 117 (11/10/2007)  Trig:203.0 (11/11/2009), 137 (11/10/2007)  Problem # 4:  HYPERTENSION, BENIGN ESSENTIAL (ICD-401.1) Assessment: Unchanged  His updated medication list for this problem includes:    Coreg 3.125 Mg Tabs (Carvedilol) ..... One tab by mouth two times a day  BP today: 122/82 Prior BP: 112/78 (06/30/2009)  Labs Reviewed: K+: 4.3 (11/11/2009) Creat: : 1.0 (11/11/2009)   Chol: 148 (11/11/2009)   HDL: 47.80 (11/11/2009)   LDL: 58 (11/10/2007)   TG: 203.0 (11/11/2009)  Problem # 5:  ANEMIA, VITAMIN B12 DEFICIENCY NEC (ICD-281.1) Assessment: Unchanged  Stable. His updated medication list for this problem includes:    Cyanocobalamin 1000 Mcg/ml Inj Soln (Cyanocobalamin) ..... Inject 1 ml im every 2 weeks  Hgb: 12.7 (11/11/2009)   Hct: 37.3 (11/11/2009)   Platelets: 213.0 (11/11/2009) RBC: 4.20 (11/11/2009)   RDW: 13.6 (11/11/2009)   WBC: 5.8 (11/11/2009) MCV: 88.9 (11/11/2009)   MCHC: 34.0 (11/11/2009) Ferritin: 23.0 (11/10/2007) Iron: 66 (11/11/2009)   % Sat: 20.1 (11/10/2007) B12: 749 (11/11/2009)   TSH: 3.17 (11/11/2009)  Problem # 6:  SEBORRHEIC DERMATITIS/ DR Allyson Sabal (ICD-690.10) Assessment: Unchanged See instructions.  Problem # 7:  ANEMIA-IRON DEFICIENCY (ICD-280.9) Assessment: Unchanged  Stable nos....jhas just given blood...double red cells. His updated medication list for this problem includes:    Cyanocobalamin 1000 Mcg/ml Inj Soln (Cyanocobalamin) ..... Inject 1 ml im every 2 weeks  Hgb: 12.7 (11/11/2009)   Hct: 37.3 (11/11/2009)   Platelets: 213.0 (11/11/2009) RBC: 4.20 (11/11/2009)   RDW: 13.6 (11/11/2009)   WBC: 5.8 (11/11/2009) MCV: 88.9 (11/11/2009)   MCHC: 34.0 (11/11/2009) Ferritin: 23.0 (11/10/2007) Iron: 66 (11/11/2009)   % Sat: 20.1 (11/10/2007) B12: 749 (11/11/2009)   TSH: 3.17 (11/11/2009)  Complete Medication List: 1)  Coreg 3.125 Mg  Tabs (Carvedilol) .... One tab by mouth two times a day 2)  Nasonex 50 Mcg/act Susp (Mometasone furoate) .Marland Kitchen.. 1 puff in each nostril daily as needed 3)  Cyanocobalamin 1000 Mcg/ml Inj Soln (Cyanocobalamin) .... Inject 1 ml im every 2 weeks 4)  Colestid 1 Gm Tabs (Colestipol hcl) .... Take 1 tablet by mouth four times a day 5)  Multivitamins Tabs (Multiple vitamin) .... Take 1 tablet by mouth once a day  Patient Instructions: 1)  Try Eucerin 5 times a day. 2)  Also try Amlactin Cream to the hands.  Prescriptions: COREG 3.125 MG  TABS (CARVEDILOL) one tab by mouth two times a day  #60 Tablet x 11   Entered and Authorized by:   Raenette Rover MD   Signed by:   Raenette Rover MD on 11/16/2009   Method used:   Electronically to        [facility] [phone]* (retail)       [address] [place], [place]  [address]       Ph: [phone] or [phone]  Fax: [phone]   RxID:   [id number]   Current Allergies (reviewed today): ! PENICILLIN

## 2010-07-25 NOTE — Assessment & Plan Note (Signed)
Summary: B-12  Nurse Visit   Allergies: 1)  ! Penicillin  Medication Administration  Injection # 1:    Medication: Vit B12 1000 mcg    Diagnosis: ANEMIA, VITAMIN B12 DEFICIENCY NEC (ICD-281.1)    Route: IM    Site: R deltoid    Exp Date: 04/26/2011    Lot #: [id number]    Mfr: [place]    Patient tolerated injection without complications    Given by: Emelia Salisbury LPN (September 22, 4825 0:78 AM)  Orders Added: 1)  Vit B12 1000 mcg [J3420] 2)  Admin of Therapeutic Inj  intramuscular or subcutaneous [96372]   Medication Administration  Injection # 1:    Medication: Vit B12 1000 mcg    Diagnosis: ANEMIA, VITAMIN B12 DEFICIENCY [place] (ICD-281.1)    Route: IM    Site: R deltoid    Exp Date: 04/26/2011    Lot #: [id number]    Mfr: [place]    Patient tolerated injection without complications    Given by: Emelia Salisbury LPN (September 22, 6752 4:92 AM)  Orders Added: 1)  Vit B12 1000 mcg [J3420] 2)  Admin of Therapeutic Inj  intramuscular or subcutaneous [[id number]]

## 2010-07-25 NOTE — Letter (Signed)
Summary: [facility]   Imported By: Phillis Knack 12/30/2009 07:50:00  _____________________________________________________________________  External Attachment:    Type:   Image     Comment:   External Document

## 2010-07-25 NOTE — Procedures (Signed)
Summary: Colonoscopy   Colonoscopy  Procedure date:  06/06/2000  Findings:      Location:  [facility].  Results: Colitis.  Crohn's      Patient Name: Paul Atkinson, Paul Atkinson MRN:  Procedure Procedures: Colonoscopy CPT: [phone].    with biopsy. CPT: [id number].  Personnel: Endoscopist: Docia Chuck. Henrene Pastor, MD.  Exam Location: Exam performed in Outpatient Clinic. Outpatient  Patient Consent: Procedure, Alternatives, Risks and Benefits discussed, consent obtained, from patient.  Indications  Surveillance of: Crohn's Disease.  Average Risk Screening Routine.  History  Medical/ Surgical History: Terminal Ileum Resection, for Crohn's in 1989.  Pre-Exam Physical: Performed Jun 06, 2000. Cardio-pulmonary exam, Rectal exam, HEENT exam , Abdominal exam, Extremity exam, Neurological exam, Mental status exam WNL.  Exam Exam: Extent of exam reached: Ileum, extent intended: Ileum.  The cecum was identified by resected. Colon retroflexion performed. Images taken. ASA Classification: I. Tolerance: excellent.  Monitoring: Pulse and BP monitoring, Oximetry used. Supplemental O2 given.  Colon Prep Used Golytely for colon prep. Prep results: excellent.  Sedation Meds: Fentanyl 100 mcg. Versed 10 mg.  Findings CROHN'S: Ileum to Cecum. established. ulcers present, Activity level active, absent ulcers present. Biopsy/Mucosal Abn. taken. ICD9: Enteritis, Small Intestine: 555. Comments: Ileal ulceration just proximal to anastomosis. No active colonic disease present.   Assessment Abnormal examination, see findings above.  Diagnoses: 555: Enteritis, Small Intestine.   Comments: 1. Active ileal Crohn's 2. B12 deficiency 3. normal colon Events  Unplanned Interventions: No intervention was required.  Unplanned Events: There were no complications. Plans Medication Plan: Await pathology. 5-ASA: pentasa 564m QID, starting Jun 06, 2000   Comments: Monthly B12 injections with DR  Schaller's office (for patient convenience) Disposition: After procedure patient sent to recovery. After recovery patient sent home.  Scheduling/Referral: Clinic Visit, to JDocia Chuck PHenrene Pastor MD, in about 6 weeks,    This report was created from the original endoscopy report, which was reviewed and signed by the above listed endoscopist.   cc:  RTeresa Pelton MD

## 2010-07-25 NOTE — Assessment & Plan Note (Signed)
Summary: B-12  Nurse Visit   Allergies: 1)  ! Penicillin  Medication Administration  Injection # 1:    Medication: Vit B12 1000 mcg    Diagnosis: ANEMIA, VITAMIN B12 DEFICIENCY NEC (ICD-281.1)    Route: IM    Site: L deltoid    Exp Date: 04/25/2011    Lot #: [id number]    Mfr: [employer] Regent    Patient tolerated injection without complications    Given by: Christena Deem CMA ([place]) (Nov 22, 2009 8:49 AM)  Orders Added: 1)  Admin of Therapeutic Inj  intramuscular or subcutaneous [96372] 2)  Vit B12 1000 mcg [J3420]   Medication Administration  Injection # 1:    Medication: Vit B12 1000 mcg    Diagnosis: ANEMIA, VITAMIN B12 DEFICIENCY NEC (ICD-281.1)    Route: IM    Site: L deltoid    Exp Date: 04/25/2011    Lot #: [id number]    Mfr: [employer] Regent    Patient tolerated injection without complications    Given by: Christena Deem CMA ([place]) (Nov 22, 2009 8:49 AM)  Orders Added: 1)  Admin of Therapeutic Inj  intramuscular or subcutaneous [96372] 2)  Vit B12 1000 mcg [E2800]

## 2010-07-25 NOTE — Progress Notes (Signed)
Summary: Colestid Rx.  Phone Note Outgoing Call   Call placed by: Randye Lobo NCMA,  March 01, 2010 9:33 AM Call placed to: Patient Summary of Call: Called patient and left message for him to call to make a follow-up appt. with Dr. Henrene Pastor and I will then send ini refill of his Colestid.   Initial call taken by: Randye Lobo NCMA,  March 01, 2010 9:34 AM  Follow-up for Phone Call        called patient and corrected the reason why I called.  We had given him #30 x 11 RFs on 12/29/09.  Just want to clarify he does have the refills on file at pharmacy.  If not, I wil call.   Follow-up by: Randye Lobo NCMA,  March 02, 2010 1:50 PM

## 2010-07-25 NOTE — Progress Notes (Signed)
Summary: refill complication  Phone Note Call from Patient Call back at Work Phone [phone]   Caller: Patient Call For: Dr. Henrene Pastor Reason for Call: Talk to Nurse Summary of Call: pt still having problems with refilling Colestipol... pharmacy says they have not been given the "ok" to refill... [facility] [address] in [place] Initial call taken by: Lucien Mons,  March 13, 2010 1:46 PM    Prescriptions: COLESTID 1 GM TABS (COLESTIPOL HCL) Take 1 tablet by mouth four times a day  #120 Tablet x 11   Entered by:   Randye Lobo NCMA   Authorized by:   Irene Shipper MD   Signed by:   Randye Lobo NCMA on 03/13/2010   Method used:   Electronically to        [facility]. [phone]* (retail)       [address] [place], [place]  [address]       Ph: [phone] or [phone]       Fax: [phone]   RxID:   [id number]

## 2010-07-25 NOTE — Assessment & Plan Note (Signed)
Summary: Paul Atkinson B12/RBH  Nurse Visit   Allergies: 1)  ! Penicillin  Medication Administration  Injection # 1:    Medication: Vit B12 1000 mcg    Diagnosis: ANEMIA, VITAMIN B12 DEFICIENCY NEC (ICD-281.1)    Route: IM    Site: L deltoid    Exp Date: 06/25/2011    Lot #: [id number]    Mfr: [place]    Patient tolerated injection without complications    Given by: Christena Deem CMA ([place]) (December 20, 2009 9:03 AM)  Orders Added: 1)  Admin of Therapeutic Inj  intramuscular or subcutaneous [96372] 2)  Vit B12 1000 mcg [J3420]   Medication Administration  Injection # 1:    Medication: Vit B12 1000 mcg    Diagnosis: ANEMIA, VITAMIN B12 DEFICIENCY NEC (ICD-281.1)    Route: IM    Site: L deltoid    Exp Date: 06/25/2011    Lot #: [id number]    Mfr: [place]    Patient tolerated injection without complications    Given by: Christena Deem CMA ([place]) (December 20, 2009 9:03 AM)  Orders Added: 1)  Admin of Therapeutic Inj  intramuscular or subcutaneous [96372] 2)  Vit B12 1000 mcg [P9150]

## 2010-07-25 NOTE — Assessment & Plan Note (Signed)
Summary: B-12/SCHALLER  Nurse Visit   Allergies: 1)  ! Penicillin  Medication Administration  Injection # 1:    Medication: Vit B12 1000 mcg    Diagnosis: ANEMIA, VITAMIN B12 DEFICIENCY NEC (ICD-281.1)    Route: IM    Site: L deltoid    Exp Date: 09/24/2011    Lot #: [id number]    Mfr: American Regent    Patient tolerated injection without complications    Given by: Edwin Dada CMA Deborra Medina) (March 09, 2010 8:48 AM)  Orders Added: 1)  Vit B12 1000 mcg [J3420] 2)  Admin of Therapeutic Inj  intramuscular or subcutaneous [[id number]]   Medication Administration  Injection # 1:    Medication: Vit B12 1000 mcg    Diagnosis: ANEMIA, VITAMIN B12 DEFICIENCY NEC (ICD-281.1)    Route: IM    Site: L deltoid    Exp Date: 09/24/2011    Lot #: [id number]    Mfr: American Regent    Patient tolerated injection without complications    Given by: Edwin Dada CMA Deborra Medina) (March 09, 2010 8:48 AM)  Orders Added: 1)  Vit B12 1000 mcg [J3420] 2)  Admin of Therapeutic Inj  intramuscular or subcutaneous [[id number]]

## 2010-07-25 NOTE — Progress Notes (Signed)
Summary: [facility]   Imported By: Phillis Knack 12/30/2009 07:57:46  _____________________________________________________________________  External Attachment:    Type:   Image     Comment:   External Document

## 2010-07-25 NOTE — Assessment & Plan Note (Signed)
Summary: SCHALLER/B-12/JRR  Nurse Visit   Allergies: 1)  ! Penicillin  Medication Administration  Injection # 1:    Medication: Vit B12 1000 mcg    Diagnosis: ANEMIA, VITAMIN B12 DEFICIENCY NEC (ICD-281.1)    Route: IM    Site: L deltoid    Exp Date: 03/25/2012    Lot #: [id number]    Mfr: [place]    Patient tolerated injection without complications    Given by: Ozzie Hoyle LPN (May 30, 4966 8:47 AM)  Orders Added: 1)  Vit B12 1000 mcg [J3420] 2)  Admin of Therapeutic Inj  intramuscular or subcutaneous [[id number]]

## 2010-07-25 NOTE — Assessment & Plan Note (Signed)
Summary: B-12/[place] /JRR  Nurse Visit   Allergies: 1)  ! Penicillin  Medication Administration  Injection # 1:    Medication: Vit B12 1000 mcg    Diagnosis: ANEMIA, VITAMIN B12 DEFICIENCY NEC (ICD-281.1)    Route: IM    Site: R deltoid    Exp Date: 12/24/2011    Lot #: [id number]    Mfr: American Regent    Patient tolerated injection without complications    Given by: Zenda Alpers CMA ([place]) (May 12, 2010 8:29 AM)  Orders Added: 1)  Vit B12 1000 mcg [J3420] 2)  Admin of Therapeutic Inj  intramuscular or subcutaneous [[id number]]

## 2010-07-25 NOTE — Assessment & Plan Note (Signed)
Summary: B12  Nurse Visit   Allergies: 1)  ! Penicillin  Medication Administration  Injection # 1:    Medication: Vit B12 1000 mcg    Diagnosis: ANEMIA, VITAMIN B12 DEFICIENCY NEC (ICD-281.1)    Route: IM    Site: L deltoid    Exp Date: 05/26/2011    Lot #: [id number]    Mfr: [place]    Patient tolerated injection without complications    Given by: Sherrian Divers CMA Deborra Medina) (February 07, 2010 8:19 AM)  Orders Added: 1)  Vit B12 1000 mcg [J3420] 2)  Admin of Therapeutic Inj  intramuscular or subcutaneous [[id number]]

## 2010-07-25 NOTE — Progress Notes (Signed)
Summary: [facility]   Imported By: Phillis Knack 12/30/2009 07:58:40  _____________________________________________________________________  External Attachment:    Type:   Image     Comment:   External Document

## 2010-07-25 NOTE — Progress Notes (Signed)
Summary: [facility]  [place] [facility]   Imported By: Phillis Knack 12/30/2009 07:59:41  _____________________________________________________________________  External Attachment:    Type:   Image     Comment:   External Document

## 2010-07-25 NOTE — Assessment & Plan Note (Signed)
Summary: SCHALLER B12/RBH  Nurse Visit   Allergies: 1)  ! Penicillin  Medication Administration  Injection # 1:    Medication: Vit B12 1000 mcg    Diagnosis: ANEMIA-IRON DEFICIENCY (ICD-280.9)    Route: IM    Site: L deltoid    Exp Date: 11/24/2011    Lot #: [id number]    Mfr: [place]    Patient tolerated injection without complications    Given by: Emelia Salisbury LPN (May 03, 5187 8:55 AM)  Orders Added: 1)  Vit B12 1000 mcg [J3420] 2)  Admin of Therapeutic Inj  intramuscular or subcutaneous [[id number]]   Medication Administration  Injection # 1:    Medication: Vit B12 1000 mcg    Diagnosis: ANEMIA-IRON DEFICIENCY (ICD-280.9)    Route: IM    Site: L deltoid    Exp Date: 11/24/2011    Lot #: [id number]    Mfr: [employer] Regent    Patient tolerated injection without complications    Given by: Emelia Salisbury LPN (May 02, 629 8:55 AM)  Orders Added: 1)  Vit B12 1000 mcg [J3420] 2)  Admin of Therapeutic Inj  intramuscular or subcutaneous [[id number]]

## 2010-07-25 NOTE — Assessment & Plan Note (Signed)
Summary: Damita Dunnings B/12/[facility]  Nurse Visit   Allergies: 1)  ! Penicillin  Medication Administration  Injection # 1:    Medication: Vit B12 1000 mcg    Diagnosis: ANEMIA, VITAMIN B12 DEFICIENCY NEC (ICD-281.1)    Route: IM    Site: R deltoid    Exp Date: 05/26/2011    Lot #: [id number]    Mfr: [place]    Patient tolerated injection without complications    Given by: Emelia Salisbury LPN (February 21, 5725 8:41 AM)  Orders Added: 1)  Vit B12 1000 mcg [J3420] 2)  Admin of Therapeutic Inj  intramuscular or subcutaneous [[id number]]   Medication Administration  Injection # 1:    Medication: Vit B12 1000 mcg    Diagnosis: ANEMIA, VITAMIN B12 DEFICIENCY NEC (ICD-281.1)    Route: IM    Site: R deltoid    Exp Date: 05/26/2011    Lot #: [id number]    Mfr: [place]    Patient tolerated injection without complications    Given by: Emelia Salisbury LPN (February 21, 5973 8:41 AM)  Orders Added: 1)  Vit B12 1000 mcg [J3420] 2)  Admin of Therapeutic Inj  intramuscular or subcutaneous [[id number]]

## 2010-07-25 NOTE — Progress Notes (Signed)
Summary: [facility]   Imported By: Phillis Knack 12/30/2009 07:56:44  _____________________________________________________________________  External Attachment:    Type:   Image     Comment:   External Document

## 2010-07-25 NOTE — Assessment & Plan Note (Signed)
Summary: B-12  Nurse Visit   Allergies: 1)  ! Penicillin  Medication Administration  Injection # 1:    Medication: Vit B12 1000 mcg    Diagnosis: ANEMIA, VITAMIN B12 DEFICIENCY NEC (ICD-281.1)    Route: IM    Site: R deltoid    Exp Date: 04/26/2011    Lot #: [id number]    Mfr: [place]    Patient tolerated injection without complications    Given by: Emelia Salisbury LPN (Nov 09, 9474 5:46 AM)  Orders Added: 1)  Vit B12 1000 mcg [J3420] 2)  Admin of Therapeutic Inj  intramuscular or subcutaneous [96372]   Medication Administration  Injection # 1:    Medication: Vit B12 1000 mcg    Diagnosis: ANEMIA, VITAMIN B12 DEFICIENCY [place] (ICD-281.1)    Route: IM    Site: R deltoid    Exp Date: 04/26/2011    Lot #: [id number]    Mfr: American Regent    Patient tolerated injection without complications    Given by: Emelia Salisbury LPN (Nov 08, 5033 4:65 AM)  Orders Added: 1)  Vit B12 1000 mcg [J3420] 2)  Admin of Therapeutic Inj  intramuscular or subcutaneous [[id number]]

## 2010-07-25 NOTE — Consult Note (Signed)
Summary: [place]  [place]   Imported By: Phillis Knack 12/30/2009 07:48:59  _____________________________________________________________________  External Attachment:    Type:   Image     Comment:   External Document

## 2010-07-25 NOTE — Progress Notes (Signed)
Summary: [place]  [place]   Imported By: Phillis Knack 12/30/2009 07:54:52  _____________________________________________________________________  External Attachment:    Type:   Image     Comment:   External Document

## 2010-07-25 NOTE — Assessment & Plan Note (Signed)
Summary: Paul Atkinson B12/RBH  Nurse Visit   Allergies: 1)  ! Penicillin  Medication Administration  Injection # 1:    Medication: Vit B12 1000 mcg    Diagnosis: ANEMIA-IRON DEFICIENCY (ICD-280.9)    Route: IM    Site: L deltoid    Exp Date: 02/24/2011    Lot #: [id number]    Mfr: [place]    Patient tolerated injection without complications    Given by: Zenda Alpers CMA ([address]) (September 08, 2009 8:42 AM)  Orders Added: 1)  Vit B12 1000 mcg [J3420] 2)  Admin of Therapeutic Inj  intramuscular or subcutaneous [[id number]]

## 2010-07-25 NOTE — Letter (Signed)
Summary: Anabel Halon letter  [place] [facility]  [address] [place], [place] [address]   Phone: [phone]  Fax: [phone]       01/26/2010 MRN: [id number]  JIAN HODGMAN [place], [place]  [address]  Dear Mr. Chase Caller [place], and [place] announce the retirement of Modesto Charon, M.D., from full-time practice at the [facility] office effective December 22, 2009 and his plans of returning part-time.  It is important to Dr. Council Mechanic and to our practice that you understand that [facility] has seven physicians in our office for your health care needs.  We will continue to offer the same exceptional care that you have today.    Dr. Council Mechanic has spoken to many of you about his plans for retirement and returning part-time in the fall.   We will continue to work with you through the transition to schedule appointments for you in the office and meet the high standards that [facility] is committed to.   Again, it is with great pleasure that we share the news that Dr. Council Mechanic will return to [facility] at [facility] in October of 2011 with a reduced schedule.    If you have any questions, or would like to request an appointment with one of our physicians, please call us at [phone] and press the option for Scheduling an appointment.  We take pleasure in providing you with excellent patient care and look forward to seeing you at your next office visit.  [facility] are:  Viviana Simpler, M.D. Teresa Pelton, M.D. Loura Pardon, M.D. Eliezer Lofts, M.D. Owens Loffler, M.D. Arnette Norris, M.D. We proudly welcomed Renford Dills, M.D. and Ria Bush, M.D. to the practice in July/August 2011.  Sincerely,  [facility]

## 2010-07-25 NOTE — Assessment & Plan Note (Signed)
Summary: B12/DLO  Nurse Visit   Allergies: 1)  ! Penicillin  Immunizations Administered:  Influenza Vaccine # 1:    Vaccine Type: Fluvax 3+    Site: left deltoid    Mfr: GlaxoSmithKline    Dose: 0.5 ml    Route: IM    Given by: Edwin Dada CMA ([place])    Exp. Date: 12/23/2010    Lot #: [id number]    VIS given: 01/17/10 version given March 23, 2010.  Flu Vaccine Consent Questions:    Do you have a history of severe allergic reactions to this vaccine? no    Any prior history of allergic reactions to egg and/or gelatin? no    Do you have a sensitivity to the preservative Thimersol? no    Do you have a past history of Guillan-Barre Syndrome? no    Do you currently have an acute febrile illness? no    Have you ever had a severe reaction to latex? no    Vaccine information given and explained to patient? yes  Medication Administration  Injection # 1:    Medication: Vit B12 1000 mcg    Diagnosis: ANEMIA, VITAMIN B12 DEFICIENCY NEC (ICD-281.1)    Route: IM    Site: R deltoid    Exp Date: 09/24/2011    Lot #: [id number]    Mfr: American Regent    Patient tolerated injection without complications    Given by: Edwin Dada CMA Deborra [place]) (March 23, 2010 9:38 AM)  Orders Added: 1)  Flu Vaccine 26yr + [90658] 2)  Admin 1st Vaccine [90471] 3)  Vit B12 1000 mcg [J3420] 4)  Admin of Therapeutic Inj  intramuscular or subcutaneous [96372]    Medication Administration  Injection # 1:    Medication: Vit B12 1000 mcg    Diagnosis: ANEMIA, VITAMIN B12 DEFICIENCY [place](ICD-281.1)    Route: IM    Site: R deltoid    Exp Date: 09/24/2011    Lot #: [id number]   Mfr: American Regent    Patient tolerated injection without complications    Given by: DEdwin DadaCMA ([place] Medina (March 23, 2010 9:38 AM)  Orders Added: 1)  Flu Vaccine 356yr+ [90658] 2)  Admin 1st Vaccine [90471] 3)  Vit B12 1000 mcg [J3420] 4)  Admin of Therapeutic Inj  intramuscular or subcutaneous [[id number]]

## 2010-07-25 NOTE — Assessment & Plan Note (Signed)
Summary: B-12/SCHALLER/JRR  Nurse Visit   Vitals Entered By: Christena Deem CMA Deborra Medina) (April 12, 2010 8:31 AM)  Allergies: 1)  ! Penicillin  Medication Administration  Injection # 1:    Medication: Vit B12 1000 mcg    Diagnosis: ANEMIA, VITAMIN B12 DEFICIENCY NEC (ICD-281.1)    Route: IM    Site: R deltoid    Exp Date: 10/23/2011    Lot #: [id number]    Mfr: American Regent    Patient tolerated injection without complications    Given by: Christena Deem CMA ([place]) (April 12, 2010 8:32 AM)  Orders Added: 1)  Admin of Therapeutic Inj  intramuscular or subcutaneous [96372] 2)  Vit B12 1000 mcg [J3420]    Medication Administration  Injection # 1:    Medication: Vit B12 1000 mcg    Diagnosis: ANEMIA, VITAMIN B12 DEFICIENCY NEC (ICD-281.1)    Route: IM    Site: R deltoid    Exp Date: 10/23/2011    Lot #: [id number]    Mfr: American Regent    Patient tolerated injection without complications    Given by: Christena Deem CMA ([place]) (April 12, 2010 8:32 AM)  Orders Added: 1)  Admin of Therapeutic Inj  intramuscular or subcutaneous [96372] 2)  Vit B12 1000 mcg [[id number]]

## 2010-07-25 NOTE — Assessment & Plan Note (Signed)
Summary: B12  CYD  Nurse Visit   Allergies: 1)  ! Penicillin  Medication Administration  Injection # 1:    Medication: Vit B12 1000 mcg    Diagnosis: ANEMIA, VITAMIN B12 DEFICIENCY NEC (ICD-281.1)    Route: IM    Site: R deltoid    Exp Date: 03/26/2011    Lot #: [id number]    Mfr: American Regent    Patient tolerated injection without complications    Given by: Emelia Salisbury LPN (August 03, 4718 8:40 AM)  Orders Added: 1)  Vit B12 1000 mcg [J3420] 2)  Admin of Therapeutic Inj  intramuscular or subcutaneous [96372]   Medication Administration  Injection # 1:    Medication: Vit B12 1000 mcg    Diagnosis: ANEMIA, VITAMIN B12 DEFICIENCY [place] (ICD-281.1)    Route: IM    Site: R deltoid    Exp Date: 03/26/2011    Lot #: [id number]    Mfr: American Regent    Patient tolerated injection without complications    Given by: Emelia Salisbury LPN (August 03, 7216 8:40 AM)  Orders Added: 1)  Vit B12 1000 mcg [J3420] 2)  Admin of Therapeutic Inj  intramuscular or subcutaneous [28833]

## 2010-07-25 NOTE — Assessment & Plan Note (Signed)
Summary: B-12  Nurse Visit   Allergies: 1)  ! Penicillin  Medication Administration  Injection # 1:    Medication: Vit B12 1000 mcg    Diagnosis: ANEMIA, VITAMIN B12 DEFICIENCY NEC (ICD-281.1)    Route: IM    Site: R deltoid    Exp Date: 05/2011    Lot #: [id number]    Mfr: [place]    Patient tolerated injection without complications    Given by: Marty Heck CMA (January 19, 2010 8:28 AM)  Orders Added: 1)  Vit B12 1000 mcg [J3420] 2)  Admin of Therapeutic Inj  intramuscular or subcutaneous [96372]   Medication Administration  Injection # 1:    Medication: Vit B12 1000 mcg    Diagnosis: ANEMIA, VITAMIN B12 DEFICIENCY NEC (ICD-281.1)    Route: IM    Site: R deltoid    Exp Date: 05/2011    Lot #: [id number]    Mfr: [place]    Patient tolerated injection without complications    Given by: Marty Heck CMA (January 19, 2010 8:28 AM)  Orders Added: 1)  Vit B12 1000 mcg [J3420] 2)  Admin of Therapeutic Inj  intramuscular or subcutaneous [[id number]]

## 2010-07-25 NOTE — Letter (Signed)
Summary: [place]  [place]   Imported By: Phillis Knack 12/30/2009 07:53:53  _____________________________________________________________________  External Attachment:    Type:   Image     Comment:   External Document

## 2010-07-27 NOTE — Assessment & Plan Note (Signed)
Summary: B-12/[place]/JRR  Nurse Visit   Allergies: 1)  ! Penicillin  Medication Administration  Injection # 1:    Medication: Vit B12 1000 mcg    Diagnosis: ANEMIA, VITAMIN B12 DEFICIENCY NEC (ICD-281.1)    Route: IM    Site: L deltoid    Exp Date: 03/25/2012    Lot #: [id number]    Mfr: [place]    Patient tolerated injection without complications    Given by: Sherrian Divers CMA ([place]) (June 30, 2010 8:40 AM)  Orders Added: 1)  Vit B12 1000 mcg [J3420] 2)  Admin of Therapeutic Inj  intramuscular or subcutaneous [33744]

## 2010-07-27 NOTE — Assessment & Plan Note (Signed)
Summary: B-12/Erique Kaser/JRR  Nurse Visit   Allergies: 1)  ! Penicillin  Medication Administration  Injection # 1:    Medication: Vit B12 1000 mcg    Diagnosis: ANEMIA, VITAMIN B12 DEFICIENCY NEC (ICD-281.1)    Route: IM    Site: L deltoid    Exp Date: 03/25/2012    Lot #: [id number]    Mfr: [place]    Patient tolerated injection without complications    Given by: Sherrian Divers CMA ([place]) (July 14, 2010 8:30 AM)  Orders Added: 1)  Vit B12 1000 mcg [J3420] 2)  Admin of Therapeutic Inj  intramuscular or subcutaneous [10404]

## 2010-07-27 NOTE — Assessment & Plan Note (Signed)
Summary: B/12/JRR/SCHALLER  Nurse Visit   Allergies: 1)  ! Penicillin  Medication Administration  Injection # 1:    Medication: Vit B12 1000 mcg    Diagnosis: ANEMIA, VITAMIN B12 DEFICIENCY NEC (ICD-281.1)    Route: IM    Site: R deltoid    Exp Date: 12/24/2011    Lot #: [id number]    Mfr: [place]    Patient tolerated injection without complications    Given by: Sherrian Divers CMA Deborra Medina) (June 14, 2010 8:27 AM)  Orders Added: 1)  Vit B12 1000 mcg [J3420] 2)  Admin of Therapeutic Inj  intramuscular or subcutaneous [97416]

## 2010-07-28 ENCOUNTER — Ambulatory Visit: Payer: BC Managed Care – PPO

## 2010-07-28 ENCOUNTER — Encounter: Payer: Self-pay | Admitting: Family Medicine

## 2010-07-28 DIAGNOSIS — D518 Other vitamin B12 deficiency anemias: Secondary | ICD-10-CM

## 2010-08-02 NOTE — Assessment & Plan Note (Signed)
Summary: NURSE VISIT  Nurse Visit   Allergies: 1)  ! Penicillin  Medication Administration  Injection # 1:    Medication: Vit B12 1000 mcg    Diagnosis: ANEMIA, VITAMIN B12 DEFICIENCY NEC (ICD-281.1)    Route: IM    Site: R deltoid    Exp Date: 12/24/2011    Lot #: [id number]    Mfr: [place]    Patient tolerated injection without complications    Given by: Edwin Dada CMA ([place]) (July 28, 2010 8:40 AM)  Orders Added: 1)  Vit B12 1000 mcg [J3420] 2)  Admin of Therapeutic Inj  intramuscular or subcutaneous [96372]     Medication Administration  Injection # 1:    Medication: Vit B12 1000 mcg    Diagnosis: ANEMIA, VITAMIN B12 DEFICIENCY NEC (ICD-281.1)    Route: IM    Site: R deltoid    Exp Date: 12/24/2011    Lot #: [id number]    Mfr: [place]    Patient tolerated injection without complications    Given by: Edwin Dada CMA ([place]) (July 28, 2010 8:40 AM)  Orders Added: 1)  Vit B12 1000 mcg [J3420] 2)  Admin of Therapeutic Inj  intramuscular or subcutaneous [[id number]]

## 2010-08-15 ENCOUNTER — Encounter: Payer: Self-pay | Admitting: Family Medicine

## 2010-08-15 ENCOUNTER — Ambulatory Visit (INDEPENDENT_AMBULATORY_CARE_PROVIDER_SITE_OTHER): Payer: BC Managed Care – PPO

## 2010-08-15 DIAGNOSIS — D518 Other vitamin B12 deficiency anemias: Secondary | ICD-10-CM

## 2010-08-22 NOTE — Assessment & Plan Note (Signed)
Summary: B12/RBH  Nurse Visit   Allergies: 1)  ! Penicillin  Medication Administration  Injection # 1:    Medication: Vit B12 1000 mcg    Diagnosis: ANEMIA, VITAMIN B12 DEFICIENCY NEC (ICD-281.1)    Route: IM    Site: R deltoid    Exp Date: 04/24/2012    Lot #: [id number]    Mfr: [place]    Patient tolerated injection without complications    Given by: Maudie Mercury Dance CMA ([place]) (August 15, 2010 8:48 AM)  Orders Added: 1)  Vit B12 1000 mcg [J3420] 2)  Admin of Therapeutic Inj  intramuscular or subcutaneous [18335]

## 2010-08-29 ENCOUNTER — Telehealth: Payer: Self-pay | Admitting: Family Medicine

## 2010-08-30 ENCOUNTER — Ambulatory Visit (INDEPENDENT_AMBULATORY_CARE_PROVIDER_SITE_OTHER): Payer: BC Managed Care – PPO

## 2010-08-30 ENCOUNTER — Encounter: Payer: Self-pay | Admitting: Family Medicine

## 2010-08-30 DIAGNOSIS — Z2911 Encounter for prophylactic immunotherapy for respiratory syncytial virus (RSV): Secondary | ICD-10-CM

## 2010-08-30 DIAGNOSIS — D518 Other vitamin B12 deficiency anemias: Secondary | ICD-10-CM

## 2010-08-30 DIAGNOSIS — Z23 Encounter for immunization: Secondary | ICD-10-CM

## 2010-09-05 ENCOUNTER — Encounter: Payer: Self-pay | Admitting: Family Medicine

## 2010-09-05 NOTE — Progress Notes (Signed)
Summary: pt wants vaccines  Phone Note Call from Patient Call back at Work Phone [phone]   Caller: Patient Call For: Raenette Rover MD Summary of Call: Pt is asking if he can get a Tdap, his wife is a Pharmacist, hospital and exposed to everything.  He would also like to get zostavax, please advise. Initial call taken by: Marty Heck CMA, AAMA,  August 29, 2010 9:15 AM  Follow-up for Phone Call        Fine with me for both. Follow-up by: Raenette Rover MD,  August 29, 2010 10:32 AM  Additional Follow-up for Phone Call Additional follow up Details #1::        Appt scheduled.        [place], AAMA  August 29, 2010 10:59 AM

## 2010-09-05 NOTE — Assessment & Plan Note (Signed)
Summary: TDAP, ZOSTAVAX- OK PER DR Bassett  Nurse Visit   Allergies: 1)  ! Penicillin  Immunizations Administered:  Tetanus Vaccine:    Vaccine Type: Tdap    Site: right deltoid    Mfr: GlaxoSmithKline    Dose: 0.5 ml    Route: IM    Given by: Christena Deem CMA ([place])    Exp. Date: 04/13/2012    Lot #: [id number]    VIS given: 05/12/08 version given August 30, 2010.  Zostavax # 1:    Vaccine Type: Zostavax    Site: Left arm    Mfr: Merck    Dose: 0.5 ml    Route: [place]    Given by: Christena Deem CMA ([place])    Exp. Date: 02/10/2011    Lot #: [id number]    VIS given: 04/06/05 given August 30, 2010.  Medication Administration  Injection # 1:    Medication: Vit B12 1000 mcg    Diagnosis: ANEMIA, VITAMIN B12 DEFICIENCY NEC (ICD-281.1)    Route: IM    Site: L deltoid    Exp Date: 05/24/2012    Lot #: [id number]    Mfr: [place]    Patient tolerated injection without complications    Given by: Christena Deem CMA ([place]) (August 30, 2010 9:22 AM)  Orders Added: 1)  Tdap => 2yr IM [90715] 2)  Admin 1st Vaccine [90471] 3)  Zoster (Shingles) Vaccine Live [90736] 4)  Admin of Any Addtl Vaccine [90472] 5)  Admin of Therapeutic Inj  intramuscular or subcutaneous [96372] 6)  Vit B12 1000 mcg [J3420]    Medication Administration  Injection # 1:    Medication: Vit B12 1000 mcg    Diagnosis: ANEMIA, VITAMIN B12 DEFICIENCY NEC (ICD-281.1)    Route: IM    Site: L deltoid    Exp Date: 05/24/2012    Lot #: [id number]   Mfr: [place]   Patient tolerated injection without complications    Given by: LChristena DeemCMA (AAMA) (August 30, 2010 9:22 AM)  Orders Added: 1)  Tdap => 758yrIM [90715] 2)  Admin 1st Vaccine [90471] 3)  Zoster (Shingles) Vaccine Live [90736] 4)  Admin of Any Addtl Vaccine [90472] 5)  Admin of Therapeutic Inj  intramuscular or subcutaneous [96372] 6)  Vit B12 1000 mcg [J3420]

## 2010-09-19 ENCOUNTER — Ambulatory Visit (INDEPENDENT_AMBULATORY_CARE_PROVIDER_SITE_OTHER): Payer: BC Managed Care – PPO | Admitting: Family Medicine

## 2010-09-19 DIAGNOSIS — D649 Anemia, unspecified: Secondary | ICD-10-CM

## 2010-09-19 MED ORDER — CYANOCOBALAMIN 1000 MCG/ML IJ SOLN
1000.0000 ug | INTRAMUSCULAR | Status: DC
  Administered 2010-09-19 – 2010-12-05 (×3): 1000 ug via INTRAMUSCULAR

## 2010-09-22 NOTE — Progress Notes (Signed)
Pt given B12 shot.

## 2010-09-27 ENCOUNTER — Ambulatory Visit: Payer: BC Managed Care – PPO

## 2010-10-05 LAB — DIFFERENTIAL
Basophils Relative: 0 % (ref 0–1)
Eosinophils Absolute: 0 10*3/uL (ref 0.0–0.7)
Eosinophils Relative: 0 % (ref 0–5)
Lymphs Abs: 0.4 10*3/uL — ABNORMAL LOW (ref 0.7–4.0)
Monocytes Relative: 7 % (ref 3–12)

## 2010-10-05 LAB — COMPREHENSIVE METABOLIC PANEL
ALT: 18 U/L (ref 0–53)
AST: 26 U/L (ref 0–37)
Alkaline Phosphatase: 77 U/L (ref 39–117)
CO2: 22 mEq/L (ref 19–32)
Calcium: 9.3 mg/dL (ref 8.4–10.5)
GFR calc Af Amer: >60 mL/min (ref >60)
GFR calc non Af Amer: >60 mL/min (ref >60)
Glucose, Bld: 136 mg/dL — ABNORMAL HIGH (ref 70–99)
Potassium: 4 mEq/L (ref 3.5–5.1)
Sodium: 139 mEq/L (ref 135–145)
Total Protein: 6.9 g/dL (ref 6.0–8.3)

## 2010-10-05 LAB — URINALYSIS, ROUTINE W REFLEX MICROSCOPIC
Bilirubin Urine: NEGATIVE
Glucose, UA: NEGATIVE mg/dL
Ketones, ur: NEGATIVE mg/dL
Protein, ur: 30 mg/dL — AB
pH: 5.5 (ref 5.0–8.0)

## 2010-10-05 LAB — CBC
Hemoglobin: 14.4 g/dL (ref 13.0–17.0)
MCHC: 33.3 g/dL (ref 30.0–36.0)
RBC: 4.9 MIL/uL (ref 4.22–5.81)

## 2010-10-05 LAB — URINE MICROSCOPIC-ADD ON

## 2010-10-12 ENCOUNTER — Ambulatory Visit (INDEPENDENT_AMBULATORY_CARE_PROVIDER_SITE_OTHER): Payer: BC Managed Care – PPO | Admitting: Family Medicine

## 2010-10-12 DIAGNOSIS — E538 Deficiency of other specified B group vitamins: Secondary | ICD-10-CM

## 2010-10-12 MED ORDER — CYANOCOBALAMIN 1000 MCG/ML IJ SOLN
1000.0000 ug | Freq: Once | INTRAMUSCULAR | Status: AC
  Administered 2010-10-12: 1000 ug via INTRAMUSCULAR

## 2010-10-12 NOTE — Progress Notes (Signed)
  Subjective:    Patient ID: Paul Atkinson, male    DOB: 11-13-1950, 60 y.o.   MRN: [id number]  HPI    Review of Systems     Objective:   Physical Exam        Assessment & Plan:  rn visit

## 2010-11-07 ENCOUNTER — Ambulatory Visit (INDEPENDENT_AMBULATORY_CARE_PROVIDER_SITE_OTHER): Payer: BC Managed Care – PPO | Admitting: Family Medicine

## 2010-11-07 DIAGNOSIS — E538 Deficiency of other specified B group vitamins: Secondary | ICD-10-CM

## 2010-11-07 MED ORDER — CYANOCOBALAMIN 1000 MCG/ML IJ SOLN
1000.0000 ug | Freq: Once | INTRAMUSCULAR | Status: AC
  Administered 2010-11-07: 1000 ug via INTRAMUSCULAR

## 2010-11-07 NOTE — Progress Notes (Signed)
B12 injection given today at nurse visit.

## 2010-11-07 NOTE — Assessment & Plan Note (Signed)
[facility] OFFICE NOTE   NAME:Paul Atkinson, Paul Atkinson                      MRN:          [id number]  DATE:11/29/2006                            DOB:          1951-05-28    HISTORY:  Paul Atkinson presents today for followup. Paul Atkinson was last evaluated  in the office December 12, 2005 for continuity care of his Chrons  ileocolitis. Paul Atkinson is status post ileocecectomy. Paul Atkinson also has a history of  bile salt diarrhea and B12 deficiency due to his ileo resection. Paul Atkinson has  taken Colestid and B12 replacement for those problems. His last  colonoscopy was performed December 17, 2005. Paul Atkinson was found to have some focal  ulcers at the anastomosis. Otherwise negative exam. Follow up in 5 years  recommended. Patient has had no clinical problems since his last visit.  Paul Atkinson requests refill of Colestid and annual laboratories.   CURRENT MEDICATIONS:  Include;  1. Colestid 1 p.o. t.i.d.  2. B12 injections twice monthly.  3. Multivitamin.  4. Zinc.  5. Saw palmetto.   PHYSICAL EXAMINATION:  Finds a well-appearing male in no acute distress.  Blood pressure is 110/58, heart rate is 76, weight is 181.8 pounds.  HEENT: Sclera are anicteric. Conjunctivae are pink. Oral mucosa is  intact. No adenopathy.  LUNGS: Clear.  HEART: Regular.  ABDOMEN: Soft without tenderness, mass, or hernia. Good bowel sounds  heard.  EXTREMITIES: Without edema.   IMPRESSION:  1. History of Chrons ileocolitis, status post ileocecectomy.      Colonoscopy December 17, 2005 demonstrating focal anastomotic      ulceration only.  2. History of bile salt diarrhea, controlled with Colestid.  3. History of B12 deficiency secondary to ileal resection. Stable on      B12 replacement therapy.   RECOMMENDATIONS:  1. Continue Colestid. A prescription with multiple refills has been      provided.  2. Continue B12 replacement therapy under the direction of Dr.      Council Mechanic.  3. Laboratories  today including CBC, comprehensive metabolic panel,      erythrocyte      sedimentation rate and C reactive protein.  4. Gastrointestinal office follow up in 1 year. Sooner for any      questions or problems.     Docia Chuck. Henrene Pastor, MD  Electronically Signed    JNP/MedQ  DD: 11/29/2006  DT: 11/29/2006  Job #: [id number]   cc:   [place] Charon, MD

## 2010-11-22 ENCOUNTER — Ambulatory Visit (INDEPENDENT_AMBULATORY_CARE_PROVIDER_SITE_OTHER): Payer: BC Managed Care – PPO | Admitting: Family Medicine

## 2010-11-22 DIAGNOSIS — D518 Other vitamin B12 deficiency anemias: Secondary | ICD-10-CM

## 2010-11-22 DIAGNOSIS — E538 Deficiency of other specified B group vitamins: Secondary | ICD-10-CM

## 2010-11-22 DIAGNOSIS — D519 Vitamin B12 deficiency anemia, unspecified: Secondary | ICD-10-CM

## 2010-11-22 MED ORDER — CYANOCOBALAMIN 1000 MCG/ML IJ SOLN: 1000.0000 ug | Freq: Once | INTRAMUSCULAR | Status: DC

## 2010-11-22 NOTE — Progress Notes (Signed)
B12 injection given during nurse visit today.

## 2010-11-22 NOTE — Progress Notes (Signed)
  Subjective:    Patient ID: Paul Atkinson, male    DOB: Nov 20, 1950, 60 y.o.   MRN: [id number]  HPIHere for B12 injection.    Review of Systems     Objective:   Physical Exam        Assessment & Plan:  Given.

## 2010-12-05 ENCOUNTER — Ambulatory Visit (INDEPENDENT_AMBULATORY_CARE_PROVIDER_SITE_OTHER): Payer: BC Managed Care – PPO | Admitting: Family Medicine

## 2010-12-05 DIAGNOSIS — E538 Deficiency of other specified B group vitamins: Secondary | ICD-10-CM

## 2010-12-05 DIAGNOSIS — D518 Other vitamin B12 deficiency anemias: Secondary | ICD-10-CM

## 2010-12-05 NOTE — Progress Notes (Signed)
B12 injection given during nurse visit today.

## 2010-12-11 ENCOUNTER — Other Ambulatory Visit: Payer: Self-pay | Admitting: Family Medicine

## 2010-12-20 ENCOUNTER — Ambulatory Visit: Payer: BC Managed Care – PPO

## 2010-12-20 ENCOUNTER — Telehealth: Payer: Self-pay

## 2010-12-20 NOTE — Telephone Encounter (Signed)
Pt received last Vit B12 injection 12/05/10. On pt's medication list Vit B 12 is listed twice with instructions to give 1032mg every 14 days and then another listing started 09/19/10 to give Vit B12 1000 mcg every 21 days..Last Vit B12 level was 749 on 11/11/09. Spoke with D[place]arena Add[place]and [place] they suggested sending phone note to Dr SCouncil Mechanicto clarify instructions and also pt has not been seen since 11/16/09. Pt said it was fine to clarify with Dr SCouncil Mechanicbut he would also like Dr PHenrene Pastorto be consulted because he did pt's bowel resection and follows his Chrons. Pt said he does not store B 12 due to stomach issues and has problems with peripheral neuropathy. Pt understands Dr SCouncil Mechanicwill return to office on 12/28/10 and can be reached at home [phone] or cell [phone] Pt will also call back if problems develop before call back from our office.

## 2010-12-26 NOTE — Telephone Encounter (Signed)
Pt should have another B12 level done. He has been on biweekly replacement for over 2 years altho I can't document he has always had it that often. Continue biweekly injections and have him schedule an overdue PE when able. He needs a B12 level one week after his injection within the month.

## 2010-12-26 NOTE — Telephone Encounter (Signed)
Patient notified as instructed by telephone. Was informed by patient that he plans on scheduling an appointment later in the week to get his B-12 injection and will schedule the B-12 level lab work and physical when he comes in at that time.

## 2011-01-02 ENCOUNTER — Ambulatory Visit: Payer: BC Managed Care – PPO

## 2011-01-09 ENCOUNTER — Encounter: Payer: Self-pay | Admitting: Internal Medicine

## 2011-01-20 ENCOUNTER — Other Ambulatory Visit: Payer: Self-pay | Admitting: Family Medicine

## 2011-01-22 ENCOUNTER — Encounter: Payer: Self-pay | Admitting: Internal Medicine

## 2011-01-22 ENCOUNTER — Other Ambulatory Visit (INDEPENDENT_AMBULATORY_CARE_PROVIDER_SITE_OTHER): Payer: BC Managed Care – PPO

## 2011-01-22 ENCOUNTER — Ambulatory Visit (INDEPENDENT_AMBULATORY_CARE_PROVIDER_SITE_OTHER): Payer: BC Managed Care – PPO | Admitting: Internal Medicine

## 2011-01-22 ENCOUNTER — Other Ambulatory Visit: Payer: Self-pay | Admitting: *Deleted

## 2011-01-22 DIAGNOSIS — K5090 Crohn's disease, unspecified, without complications: Secondary | ICD-10-CM

## 2011-01-22 DIAGNOSIS — E538 Deficiency of other specified B group vitamins: Secondary | ICD-10-CM

## 2011-01-22 DIAGNOSIS — R197 Diarrhea, unspecified: Secondary | ICD-10-CM

## 2011-01-22 LAB — CBC WITH DIFFERENTIAL/PLATELET
Basophils Relative: 0.4 % (ref 0.0–3.0)
Eosinophils Relative: 1.5 % (ref 0.0–5.0)
HCT: 40.4 % (ref 39.0–52.0)
Hemoglobin: 13.8 g/dL (ref 13.0–17.0)
Lymphs Abs: 0.9 10*3/uL (ref 0.7–4.0)
Monocytes Relative: 8.8 % (ref 3.0–12.0)
Neutro Abs: 5.3 10*3/uL (ref 1.4–7.7)
RBC: 4.29 Mil/uL (ref 4.22–5.81)
WBC: 6.9 10*3/uL (ref 4.5–10.5)

## 2011-01-22 MED ORDER — PEG-KCL-NACL-NASULF-NA ASC-C 100 G PO SOLR: 1.0000 | Freq: Once | ORAL | Status: DC

## 2011-01-22 MED ORDER — CARVEDILOL 3.125 MG PO TABS: 3.1250 mg | ORAL_TABLET | Freq: Two times a day (BID) | ORAL | Status: DC

## 2011-01-22 NOTE — Progress Notes (Signed)
HISTORY OF PRESENT ILLNESS:  Paul Atkinson is a 60 y.o. male with a history of Crohn's ileocolitis for which he is status post ileocecectomy greater than 20 years ago. He also has a history of bile salt induced diarrhea for which he takes Colestid with good control of bowel habits. He also has a history of B12 deficiency for which he undergoes regular replacement therapy. His only other medical problem is hypertension. He was last evaluated in the office last year. At that time, mild anemia noted. Iron supplement recommended. He denies any interval medical problems. He continues with B12 supplementation, though he has found working with the current outpatient office to be a bit cumbersome. His GI review of systems is remarkable for occasional indigestion, gas, and urgent bowel habits. No bleeding. His last colonoscopy was in 2007 at which time he was noted to have a few focal anastomotic ulcers which were small. Otherwise negative. Due for followup this year.  REVIEW OF SYSTEMS:  All non-GI ROS negative except for hearing problems and skin rash  Past Medical History  Diagnosis Date  . Crohn disease     status post ileocieal ectomy 1989  . Vitamin B 12 deficiency     secondary to ileal resection  . Bile salt-induced diarrhea   . Hypertension   . Anemia     iron def  . Malabsorption     Past Surgical History  Procedure Date  . Status post ileocecectomy   . Septoplasty 05/2004    2nd to dev. septum (Dr. Myna Hidalgo)  . Mouth surgery     Social History [name]  reports that he has never smoked. He has never used smokeless tobacco. He reports that he does not drink alcohol or use illicit drugs.  family history includes Diabetes in his father; Heart disease in his father and mother; Hyperlipidemia in his father; Hypertension in his mother; and Kidney disease in his father and mother.  There is no history of Colon cancer.  Allergies  Allergen Reactions  . Penicillins     REACTION:  RASH       PHYSICAL EXAMINATION:  Vital signs: BP 132/78  Pulse 88  Ht 6' (1.829 m)  Wt 179 lb (81.194 kg)  BMI 24.28 kg/m2 General: Well-developed, well-nourished, no acute distress HEENT: Sclerae are anicteric, conjunctiva pink. Oral mucosa intact Lungs: Clear Heart: Regular Abdomen: soft, nontender, nondistended, no obvious ascites, no peritoneal signs, normal bowel sounds. No organomegaly. Extremities: No edema Psychiatric: alert and oriented x3. Cooperative     ASSESSMENT:  #1. History of Crohn's disease status post remote ileocecectomy. Due for surveillance colonoscopy #2. B12 deficiency status post ileal resection. Receives replacement therapy #3. Bile salt induced diarrhea. Controlled with Colestid    PLAN:  #1. CBC, B12, and ferritin today #2. Schedule surveillance colonoscopy.The nature of the procedure, as well as the risks, benefits, and alternatives were carefully and thoroughly reviewed with the patient. Ample time for discussion and questions allowed. The patient understood, was satisfied, and agreed to proceed. Movi prep prescribed #3. Refill Colestid as needed #4. Continue with B12 replacement and multivitamin with iron

## 2011-01-22 NOTE — Patient Instructions (Signed)
Colonoscopy [facility] 01/26/11 3:30 pm arrive at 2:30 pm on 4th floor Moviprep sent to your pharmacy Colonoscopy brochure given for you to review.

## 2011-01-23 ENCOUNTER — Encounter: Payer: Self-pay | Admitting: Internal Medicine

## 2011-01-26 ENCOUNTER — Other Ambulatory Visit: Payer: BC Managed Care – PPO | Admitting: Internal Medicine

## 2011-01-26 ENCOUNTER — Telehealth: Payer: Self-pay

## 2011-01-26 NOTE — Telephone Encounter (Signed)
Message copied by Faythe Casa on Fri Jan 26, 2011  9:38 AM ------      Message from: Irene Shipper      Created: Thu Jan 25, 2011 10:08 AM       Let pt know that his cbc is normal. B12 is normal. Iron is a little low. Recommend B12 once monthly and MVI with iron daily

## 2011-01-26 NOTE — Telephone Encounter (Signed)
Pt aware, he gets his B12 injections at [facility].

## 2011-02-08 ENCOUNTER — Ambulatory Visit (AMBULATORY_SURGERY_CENTER): Payer: BC Managed Care – PPO | Admitting: Internal Medicine

## 2011-02-08 ENCOUNTER — Encounter: Payer: Self-pay | Admitting: Internal Medicine

## 2011-02-08 VITALS — BP 138/82 | HR 60 | Temp 97.4°F | Resp 18 | Ht 75.0 in | Wt 173.0 lb

## 2011-02-08 DIAGNOSIS — E538 Deficiency of other specified B group vitamins: Secondary | ICD-10-CM

## 2011-02-08 DIAGNOSIS — Z1211 Encounter for screening for malignant neoplasm of colon: Secondary | ICD-10-CM

## 2011-02-08 DIAGNOSIS — K5090 Crohn's disease, unspecified, without complications: Secondary | ICD-10-CM

## 2011-02-08 DIAGNOSIS — K5080 Crohn's disease of both small and large intestine without complications: Secondary | ICD-10-CM

## 2011-02-08 MED ORDER — SODIUM CHLORIDE 0.9 % IV SOLN: 500.0000 mL | INTRAVENOUS | Status: DC

## 2011-02-08 NOTE — Patient Instructions (Signed)
Follow your discharge instructions.  Continue your medications.  Next Colonoscopy in 5 years.

## 2011-02-09 ENCOUNTER — Telehealth: Payer: Self-pay

## 2011-02-09 NOTE — Telephone Encounter (Signed)

## 2011-02-26 ENCOUNTER — Other Ambulatory Visit: Payer: Self-pay | Admitting: Family Medicine

## 2011-03-27 ENCOUNTER — Other Ambulatory Visit: Payer: Self-pay | Admitting: Family Medicine

## 2011-03-28 ENCOUNTER — Other Ambulatory Visit: Payer: Self-pay | Admitting: Family Medicine

## 2011-03-28 DIAGNOSIS — D518 Other vitamin B12 deficiency anemias: Secondary | ICD-10-CM

## 2011-03-28 DIAGNOSIS — I10 Essential (primary) hypertension: Secondary | ICD-10-CM

## 2011-03-28 DIAGNOSIS — Z125 Encounter for screening for malignant neoplasm of prostate: Secondary | ICD-10-CM

## 2011-03-28 DIAGNOSIS — D509 Iron deficiency anemia, unspecified: Secondary | ICD-10-CM

## 2011-03-28 DIAGNOSIS — E786 Lipoprotein deficiency: Secondary | ICD-10-CM

## 2011-03-29 ENCOUNTER — Other Ambulatory Visit (INDEPENDENT_AMBULATORY_CARE_PROVIDER_SITE_OTHER): Payer: BC Managed Care – PPO

## 2011-03-29 ENCOUNTER — Ambulatory Visit (INDEPENDENT_AMBULATORY_CARE_PROVIDER_SITE_OTHER): Payer: BC Managed Care – PPO | Admitting: *Deleted

## 2011-03-29 ENCOUNTER — Ambulatory Visit: Payer: BC Managed Care – PPO

## 2011-03-29 DIAGNOSIS — I10 Essential (primary) hypertension: Secondary | ICD-10-CM

## 2011-03-29 DIAGNOSIS — D509 Iron deficiency anemia, unspecified: Secondary | ICD-10-CM

## 2011-03-29 DIAGNOSIS — Z23 Encounter for immunization: Secondary | ICD-10-CM

## 2011-03-29 DIAGNOSIS — D518 Other vitamin B12 deficiency anemias: Secondary | ICD-10-CM

## 2011-03-29 DIAGNOSIS — E786 Lipoprotein deficiency: Secondary | ICD-10-CM

## 2011-03-29 DIAGNOSIS — Z125 Encounter for screening for malignant neoplasm of prostate: Secondary | ICD-10-CM

## 2011-03-29 LAB — CBC WITH DIFFERENTIAL/PLATELET
Basophils Absolute: 0 10*3/uL (ref 0.0–0.1)
Basophils Relative: 0.4 % (ref 0.0–3.0)
HCT: 40.6 % (ref 39.0–52.0)
Hemoglobin: 13.7 g/dL (ref 13.0–17.0)
Lymphocytes Relative: 17 % (ref 12.0–46.0)
Lymphs Abs: 0.9 10*3/uL (ref 0.7–4.0)
Monocytes Relative: 10.6 % (ref 3.0–12.0)
Neutro Abs: 3.5 10*3/uL (ref 1.4–7.7)
RBC: 4.31 Mil/uL (ref 4.22–5.81)
RDW: 13.4 % (ref 11.5–14.6)

## 2011-03-29 LAB — IRON: Iron: 119 ug/dL (ref 42–165)

## 2011-03-29 LAB — LIPID PANEL
HDL: 42.4 mg/dL (ref >39.00)
LDL Cholesterol: 54 mg/dL (ref 0–99)
VLDL: 33.2 mg/dL (ref 0.0–40.0)

## 2011-03-29 LAB — BASIC METABOLIC PANEL
CO2: 28 mEq/L (ref 19–32)
GFR: 78.85 mL/min (ref >60.00)
Glucose, Bld: 101 mg/dL — ABNORMAL HIGH (ref 70–99)
Potassium: 4.7 mEq/L (ref 3.5–5.1)
Sodium: 141 mEq/L (ref 135–145)

## 2011-03-29 LAB — HEPATIC FUNCTION PANEL
Bilirubin, Direct: 0.2 mg/dL (ref 0.0–0.3)
Total Bilirubin: 1.4 mg/dL — ABNORMAL HIGH (ref 0.3–1.2)

## 2011-03-29 LAB — TSH: TSH: 2.05 u[IU]/mL (ref 0.35–5.50)

## 2011-03-29 LAB — VITAMIN B12: Vitamin B-12: 349 pg/mL (ref 211–911)

## 2011-03-29 LAB — PSA: PSA: 0.72 ng/mL (ref 0.10–4.00)

## 2011-03-29 MED ORDER — CYANOCOBALAMIN 1000 MCG/ML IJ SOLN
1000.0000 ug | Freq: Once | INTRAMUSCULAR | Status: AC
  Administered 2011-03-29: 1000 ug via INTRAMUSCULAR

## 2011-04-05 ENCOUNTER — Encounter: Payer: BC Managed Care – PPO | Admitting: Family Medicine

## 2011-04-10 ENCOUNTER — Ambulatory Visit (INDEPENDENT_AMBULATORY_CARE_PROVIDER_SITE_OTHER): Payer: BC Managed Care – PPO | Admitting: Family Medicine

## 2011-04-10 ENCOUNTER — Encounter: Payer: Self-pay | Admitting: Family Medicine

## 2011-04-10 DIAGNOSIS — R197 Diarrhea, unspecified: Secondary | ICD-10-CM

## 2011-04-10 DIAGNOSIS — D518 Other vitamin B12 deficiency anemias: Secondary | ICD-10-CM

## 2011-04-10 DIAGNOSIS — E786 Lipoprotein deficiency: Secondary | ICD-10-CM

## 2011-04-10 DIAGNOSIS — D509 Iron deficiency anemia, unspecified: Secondary | ICD-10-CM

## 2011-04-10 DIAGNOSIS — I10 Essential (primary) hypertension: Secondary | ICD-10-CM

## 2011-04-10 DIAGNOSIS — K5090 Crohn's disease, unspecified, without complications: Secondary | ICD-10-CM

## 2011-04-10 MED ORDER — MOMETASONE FUROATE 50 MCG/ACT NA SUSP: 1.0000 | Freq: Every day | NASAL | Status: DC

## 2011-04-10 NOTE — Assessment & Plan Note (Signed)
Cont Colestid and f/u with Dr Henrene Pastor as needed.

## 2011-04-10 NOTE — Assessment & Plan Note (Signed)
Well controlled on Colestid. Cont.

## 2011-04-10 NOTE — Assessment & Plan Note (Signed)
Good control. Cont curr meds. BP: 120/78 mmHg

## 2011-04-10 NOTE — Patient Instructions (Signed)
Cont curr meds.  Call for appt for PE next year with new doctor. RTC sooner as needed.

## 2011-04-10 NOTE — Progress Notes (Signed)
Subjective:    Patient ID: Paul Atkinson, male    DOB: 1951/06/19, 60 y.o.   MRN: [id number]  HPI Pt here for Comp Exam. He has been seen by GI in the interim and had colonoscopy. He had no new findings except old ulcer site, no evidence of active Crohn's. His diagnosis took quite a while to elucidate. He has done well since his surgery and has recently retired. His wife is also retired. He will start parttime in the New Year.  He has no new problems except for tinnitus. He has no dizziness. He has high pitched and has had for quite some time, has now developed low pitch which is right in the phone frequency. He has been seen multiple times by Dr Kathyrn Sheriff.  He also has mild skin disease, only on the tips of the fingers. He has tried multiple things for trmt.    Review of Systems  Constitutional: Negative for fever, chills, diaphoresis, appetite change, fatigue and unexpected weight change.  HENT: Negative for hearing loss, ear pain, tinnitus and ear discharge.   Eyes: Negative for pain, discharge and visual disturbance.  Respiratory: Negative for cough, shortness of breath and wheezing.   Cardiovascular: Negative for chest pain and palpitations.       No SOB w/ exertion  Gastrointestinal: Positive for diarrhea (controlled if takes his Colestid, needs chronically and has diarrhea if misses one dose.). Negative for nausea, vomiting, abdominal pain, constipation and blood in stool.       No heartburn or swallowing problems.  Genitourinary: Negative for dysuria, frequency and difficulty urinating.       No nocturia  Musculoskeletal: Positive for arthralgias (knees hurt some.). Negative for myalgias and back pain.  Skin: Negative for rash.       No itching but has dryness and cracking of the fingertips. .  Neurological: Negative for tremors and numbness.       No tingling or balance problems.  Hematological: Negative for adenopathy. Does not bruise/bleed easily.  Psychiatric/Behavioral:  Negative for dysphoric mood and agitation.       Objective:   Physical Exam  Constitutional: He is oriented to person, place, and time. He appears well-developed and well-nourished. No distress.  HENT:  Head: Normocephalic and atraumatic.  Right Ear: External ear normal.  Left Ear: External ear normal.  Nose: Nose normal.  Mouth/Throat: Oropharynx is clear and moist.  Eyes: Conjunctivae and EOM are normal. Pupils are equal, round, and reactive to light. Right eye exhibits no discharge. Left eye exhibits no discharge. No scleral icterus.  Neck: Normal range of motion. Neck supple. No thyromegaly present.  Cardiovascular: Normal rate, regular rhythm, normal heart sounds and intact distal pulses.   No murmur heard. Pulmonary/Chest: Effort normal and breath sounds normal. No respiratory distress. He has no wheezes.  Abdominal: Soft. Bowel sounds are normal. He exhibits no distension and no mass. There is no tenderness. There is no rebound and no guarding.  Genitourinary: Penis normal.       No hernias felt.  Musculoskeletal: Normal range of motion. He exhibits no edema.  Lymphadenopathy:    He has no cervical adenopathy.  Neurological: He is alert and oriented to person, place, and time. Coordination normal.  Skin: Skin is warm and dry. No rash noted. He is not diaphoretic.       Mild dryness of the fingertips.  Psychiatric: He has a normal mood and affect. His behavior is normal. Judgment and thought content normal.  Assessment & Plan:  HMPE

## 2011-04-10 NOTE — Assessment & Plan Note (Signed)
Adequate replacement. Cont curr dosing.

## 2011-04-10 NOTE — Assessment & Plan Note (Signed)
Not anemic today. Iron level adequate. Cont curr dosing.

## 2011-04-10 NOTE — Assessment & Plan Note (Signed)
Nos good. Cont curr therapy.   Lab Results  Component Value Date   CHOL 130 03/29/2011   HDL 42.40 03/29/2011   LDLCALC 54 03/29/2011   LDLDIRECT 80.4 11/11/2009   TRIG 166.0* 03/29/2011   CHOLHDL 3 03/29/2011

## 2011-04-12 ENCOUNTER — Other Ambulatory Visit: Payer: Self-pay | Admitting: Internal Medicine

## 2011-04-27 ENCOUNTER — Other Ambulatory Visit: Payer: Self-pay | Admitting: Family Medicine

## 2011-05-06 ENCOUNTER — Other Ambulatory Visit: Payer: Self-pay | Admitting: Internal Medicine

## 2011-06-21 ENCOUNTER — Other Ambulatory Visit: Payer: Self-pay | Admitting: Dermatology

## 2011-07-03 ENCOUNTER — Ambulatory Visit (INDEPENDENT_AMBULATORY_CARE_PROVIDER_SITE_OTHER): Payer: BC Managed Care – PPO | Admitting: *Deleted

## 2011-07-03 DIAGNOSIS — D518 Other vitamin B12 deficiency anemias: Secondary | ICD-10-CM

## 2011-07-03 DIAGNOSIS — D519 Vitamin B12 deficiency anemia, unspecified: Secondary | ICD-10-CM

## 2011-07-03 MED ORDER — CYANOCOBALAMIN 1000 MCG/ML IJ SOLN
1000.0000 ug | Freq: Once | INTRAMUSCULAR | Status: AC
  Administered 2011-07-03: 1000 ug via INTRAMUSCULAR

## 2011-09-08 ENCOUNTER — Other Ambulatory Visit: Payer: Self-pay | Admitting: Internal Medicine

## 2011-10-17 ENCOUNTER — Ambulatory Visit (INDEPENDENT_AMBULATORY_CARE_PROVIDER_SITE_OTHER): Payer: BC Managed Care – PPO | Admitting: *Deleted

## 2011-10-17 DIAGNOSIS — E538 Deficiency of other specified B group vitamins: Secondary | ICD-10-CM

## 2011-10-17 MED ORDER — CYANOCOBALAMIN 1000 MCG/ML IJ SOLN
1000.0000 ug | Freq: Once | INTRAMUSCULAR | Status: AC
  Administered 2011-10-17: 1000 ug via INTRAMUSCULAR

## 2011-12-05 ENCOUNTER — Ambulatory Visit (INDEPENDENT_AMBULATORY_CARE_PROVIDER_SITE_OTHER): Payer: BC Managed Care – PPO | Admitting: *Deleted

## 2011-12-05 DIAGNOSIS — D518 Other vitamin B12 deficiency anemias: Secondary | ICD-10-CM

## 2011-12-05 MED ORDER — CYANOCOBALAMIN 1000 MCG/ML IJ SOLN
1000.0000 ug | Freq: Once | INTRAMUSCULAR | Status: AC
  Administered 2011-12-05: 1000 ug via INTRAMUSCULAR

## 2011-12-10 ENCOUNTER — Other Ambulatory Visit: Payer: Self-pay | Admitting: Family Medicine

## 2012-01-11 ENCOUNTER — Ambulatory Visit (INDEPENDENT_AMBULATORY_CARE_PROVIDER_SITE_OTHER): Payer: BC Managed Care – PPO | Admitting: *Deleted

## 2012-01-11 DIAGNOSIS — E538 Deficiency of other specified B group vitamins: Secondary | ICD-10-CM

## 2012-01-11 MED ORDER — CYANOCOBALAMIN 1000 MCG/ML IJ SOLN
1000.0000 ug | Freq: Once | INTRAMUSCULAR | Status: AC
  Administered 2012-01-11: 1000 ug via INTRAMUSCULAR

## 2012-02-03 ENCOUNTER — Other Ambulatory Visit: Payer: Self-pay | Admitting: Internal Medicine

## 2012-03-18 ENCOUNTER — Telehealth: Payer: Self-pay | Admitting: Family Medicine

## 2012-03-18 NOTE — Telephone Encounter (Signed)
Caller: [name]/Patient; Patient Name: Paul Atkinson; PCP: Teresa Pelton (retired); Best Callback Phone Number: [phone].  Patient states he is calling to verify appointment time for B12 Injections on 03/19/12. RN verified appointment time in [facility].   Patient advised appointment is scheduled for 03/19/12 1445. Patient verbalizes understanding and agreeable.

## 2012-03-19 ENCOUNTER — Ambulatory Visit (INDEPENDENT_AMBULATORY_CARE_PROVIDER_SITE_OTHER): Payer: BC Managed Care – PPO

## 2012-03-19 DIAGNOSIS — E538 Deficiency of other specified B group vitamins: Secondary | ICD-10-CM

## 2012-03-19 MED ORDER — CYANOCOBALAMIN 1000 MCG/ML IJ SOLN
1000.0000 ug | Freq: Once | INTRAMUSCULAR | Status: AC
  Administered 2012-03-19: 1000 ug via INTRAMUSCULAR

## 2012-04-11 ENCOUNTER — Ambulatory Visit (INDEPENDENT_AMBULATORY_CARE_PROVIDER_SITE_OTHER): Payer: BC Managed Care – PPO | Admitting: Internal Medicine

## 2012-04-11 ENCOUNTER — Encounter: Payer: Self-pay | Admitting: Internal Medicine

## 2012-04-11 VITALS — BP 120/80 | HR 64 | Ht 72.0 in | Wt 181.8 lb

## 2012-04-11 DIAGNOSIS — E538 Deficiency of other specified B group vitamins: Secondary | ICD-10-CM

## 2012-04-11 DIAGNOSIS — K9089 Other intestinal malabsorption: Secondary | ICD-10-CM

## 2012-04-11 DIAGNOSIS — K5090 Crohn's disease, unspecified, without complications: Secondary | ICD-10-CM

## 2012-04-11 MED ORDER — COLESTIPOL HCL 1 G PO TABS: ORAL_TABLET | ORAL | Status: DC

## 2012-04-11 NOTE — Patient Instructions (Addendum)
We have sent the following medications to your pharmacy for you to pick up at your convenience:  Colestipol  Please follow up with Dr. Henrene Pastor in one year

## 2012-04-11 NOTE — Progress Notes (Signed)
HISTORY OF PRESENT ILLNESS:  Paul Atkinson is a 61 y.o. male with a history of Crohn's ileocolitis for which she is status post ileocecectomy elsewhere greater than 20 years ago. He also has a history of bile salt induced diarrhea for which she takes Colestid with good control of bowel habits. Finally, history of B12 deficiency for which he undergoes regular replacement at the direction of his primary provider. He presents today for routine annual followup. His last office visit was 01/22/2011. At that time he was doing well. He was due for surveillance colonoscopy, and this was performed on 02/08/2011. There was an isolated ulcer in the region of the anastomosis. However, no other abnormalities. The colonic mucosa and neo-ileum were normal. Since that time he has done well. No GI complaints. Formed bowel movements with some urgency. No pain or bleeding. No extraintestinal complaints aside from psoriasis.  REVIEW OF SYSTEMS:  All non-GI ROS negative except for psoriasis  Past Medical History  Diagnosis Date  . Crohn disease     status post ileocieal ectomy 1989  . Vitamin B 12 deficiency     secondary to ileal resection  . Bile salt-induced diarrhea   . Hypertension   . Anemia     iron def  . Malabsorption   . Disorder of inner ear   . Psoriasis     Past Surgical History  Procedure Date  . Status post ileocecectomy   . Septoplasty 05/2004    2nd to dev. septum (Dr. Myna Hidalgo)  . Mouth surgery     Social History [name]  reports that he has never smoked. He has never used smokeless tobacco. He reports that he does not drink alcohol or use illicit drugs.  family history includes Diabetes in his father; Heart disease in his father and mother; Hyperlipidemia in his father; Hypertension in his mother; and Kidney disease in his father and mother.  There is no history of Colon cancer.  Allergies  Allergen Reactions  . Penicillins     REACTION: RASH       PHYSICAL  EXAMINATION: Vital signs: BP 120/80  Pulse 64  Ht 6' (1.829 m)  Wt 181 lb 12.8 oz (82.464 kg)  BMI 24.66 kg/m2  SpO2 98% General: Well-developed, well-nourished, no acute distress HEENT: Sclerae are anicteric, conjunctiva pink. Oral mucosa intact Lungs: Clear Heart: Regular Abdomen: soft, nontender, nondistended, no obvious ascites, no peritoneal signs, normal bowel sounds. No organomegaly. Extremities: No edema. Fissuring on the hands, particularly right thumb Psychiatric: alert and oriented x3. Cooperative    ASSESSMENT:  #1. History of Crohn's ileocolitis status post ileocecectomy greater than 20 years ago. Colonoscopy last year without evidence of active disease. No interval complaints. #2. Bile salt induced diarrhea secondary to surgical resection #3. B12 deficiency secondary to surgical resection   PLAN:  #1. Continue active surveillance #2. Continue Colestid for bile salt diarrhea #3. Continue B12 replacement with PCP #4. Anticipate relook colonoscopy in 5 years. Routine office followup in about 1 year. Sooner as clinically indicated

## 2012-04-25 ENCOUNTER — Ambulatory Visit (INDEPENDENT_AMBULATORY_CARE_PROVIDER_SITE_OTHER): Payer: BC Managed Care – PPO | Admitting: *Deleted

## 2012-04-25 DIAGNOSIS — E538 Deficiency of other specified B group vitamins: Secondary | ICD-10-CM

## 2012-04-25 DIAGNOSIS — Z23 Encounter for immunization: Secondary | ICD-10-CM

## 2012-04-25 MED ORDER — CYANOCOBALAMIN 1000 MCG/ML IJ SOLN
1000.0000 ug | Freq: Once | INTRAMUSCULAR | Status: AC
  Administered 2012-04-25: 1000 ug via INTRAMUSCULAR

## 2012-04-30 ENCOUNTER — Telehealth: Payer: Self-pay

## 2012-04-30 NOTE — Telephone Encounter (Signed)
Pt request 03/2011 lab results for [name] form filling out. Results given to pt. Pt said he will call for appt with Dr Danise Mina.

## 2012-06-06 ENCOUNTER — Ambulatory Visit (INDEPENDENT_AMBULATORY_CARE_PROVIDER_SITE_OTHER): Payer: BC Managed Care – PPO | Admitting: *Deleted

## 2012-06-06 DIAGNOSIS — E538 Deficiency of other specified B group vitamins: Secondary | ICD-10-CM

## 2012-06-06 MED ORDER — CYANOCOBALAMIN 1000 MCG/ML IJ SOLN
1000.0000 ug | Freq: Once | INTRAMUSCULAR | Status: AC
  Administered 2012-06-06: 1000 ug via INTRAMUSCULAR

## 2012-06-09 ENCOUNTER — Other Ambulatory Visit: Payer: Self-pay | Admitting: Internal Medicine

## 2012-07-04 ENCOUNTER — Ambulatory Visit (INDEPENDENT_AMBULATORY_CARE_PROVIDER_SITE_OTHER): Payer: BC Managed Care – PPO | Admitting: *Deleted

## 2012-07-04 DIAGNOSIS — E538 Deficiency of other specified B group vitamins: Secondary | ICD-10-CM

## 2012-07-04 MED ORDER — CYANOCOBALAMIN 1000 MCG/ML IJ SOLN
1000.0000 ug | Freq: Once | INTRAMUSCULAR | Status: AC
  Administered 2012-07-04: 1000 ug via INTRAMUSCULAR

## 2012-07-13 ENCOUNTER — Other Ambulatory Visit: Payer: Self-pay | Admitting: Family Medicine

## 2012-08-20 ENCOUNTER — Ambulatory Visit (INDEPENDENT_AMBULATORY_CARE_PROVIDER_SITE_OTHER): Payer: BC Managed Care – PPO | Admitting: *Deleted

## 2012-08-20 MED ORDER — CYANOCOBALAMIN 1000 MCG/ML IJ SOLN
1000.0000 ug | Freq: Once | INTRAMUSCULAR | Status: AC
  Administered 2012-08-20: 1000 ug via INTRAMUSCULAR

## 2012-10-25 ENCOUNTER — Other Ambulatory Visit: Payer: Self-pay | Admitting: Internal Medicine

## 2012-10-25 ENCOUNTER — Other Ambulatory Visit: Payer: Self-pay | Admitting: Family Medicine

## 2012-12-19 ENCOUNTER — Ambulatory Visit (INDEPENDENT_AMBULATORY_CARE_PROVIDER_SITE_OTHER): Payer: BC Managed Care – PPO | Admitting: *Deleted

## 2012-12-19 DIAGNOSIS — E538 Deficiency of other specified B group vitamins: Secondary | ICD-10-CM

## 2012-12-19 MED ORDER — CYANOCOBALAMIN 1000 MCG/ML IJ SOLN
1000.0000 ug | Freq: Once | INTRAMUSCULAR | Status: AC
  Administered 2012-12-19: 1000 ug via INTRAMUSCULAR

## 2013-01-01 ENCOUNTER — Other Ambulatory Visit: Payer: Self-pay | Admitting: Family Medicine

## 2013-01-19 ENCOUNTER — Other Ambulatory Visit: Payer: Self-pay | Admitting: Dermatology

## 2013-02-11 ENCOUNTER — Other Ambulatory Visit: Payer: Self-pay | Admitting: Family Medicine

## 2013-02-13 ENCOUNTER — Other Ambulatory Visit: Payer: BC Managed Care – PPO

## 2013-02-16 ENCOUNTER — Other Ambulatory Visit (INDEPENDENT_AMBULATORY_CARE_PROVIDER_SITE_OTHER): Payer: BC Managed Care – PPO

## 2013-02-16 ENCOUNTER — Other Ambulatory Visit: Payer: Self-pay | Admitting: Family Medicine

## 2013-02-16 DIAGNOSIS — D518 Other vitamin B12 deficiency anemias: Secondary | ICD-10-CM

## 2013-02-16 DIAGNOSIS — Z125 Encounter for screening for malignant neoplasm of prostate: Secondary | ICD-10-CM

## 2013-02-16 DIAGNOSIS — I10 Essential (primary) hypertension: Secondary | ICD-10-CM

## 2013-02-16 DIAGNOSIS — E781 Pure hyperglyceridemia: Secondary | ICD-10-CM

## 2013-02-16 DIAGNOSIS — D509 Iron deficiency anemia, unspecified: Secondary | ICD-10-CM

## 2013-02-16 DIAGNOSIS — E559 Vitamin D deficiency, unspecified: Secondary | ICD-10-CM

## 2013-02-16 DIAGNOSIS — E786 Lipoprotein deficiency: Secondary | ICD-10-CM

## 2013-02-16 LAB — COMPREHENSIVE METABOLIC PANEL
ALT: 16 U/L (ref 0–53)
AST: 22 U/L (ref 0–37)
Albumin: 4.2 g/dL (ref 3.5–5.2)
Alkaline Phosphatase: 50 U/L (ref 39–117)
BUN: 18 mg/dL (ref 6–23)
Calcium: 9.1 mg/dL (ref 8.4–10.5)
Chloride: 105 mEq/L (ref 96–112)
Creatinine, Ser: 1 mg/dL (ref 0.4–1.5)
Potassium: 4.5 mEq/L (ref 3.5–5.1)

## 2013-02-16 LAB — CBC WITH DIFFERENTIAL/PLATELET
Basophils Relative: 0.2 % (ref 0.0–3.0)
Eosinophils Relative: 3.1 % (ref 0.0–5.0)
Lymphocytes Relative: 18.3 % (ref 12.0–46.0)
MCV: 93.4 fl (ref 78.0–100.0)
Monocytes Absolute: 0.7 10*3/uL (ref 0.1–1.0)
Neutrophils Relative %: 65 % (ref 43.0–77.0)
Platelets: 205 10*3/uL (ref 150.0–400.0)
RBC: 4.54 Mil/uL (ref 4.22–5.81)
WBC: 5 10*3/uL (ref 4.5–10.5)

## 2013-02-16 LAB — LIPID PANEL
HDL: 39.1 mg/dL (ref >39.00)
Total CHOL/HDL Ratio: 3
VLDL: 40.6 mg/dL — ABNORMAL HIGH (ref 0.0–40.0)

## 2013-02-16 LAB — FERRITIN: Ferritin: 36.2 ng/mL (ref 22.0–322.0)

## 2013-02-16 LAB — LDL CHOLESTEROL, DIRECT: Direct LDL: 56.5 mg/dL

## 2013-02-16 LAB — VITAMIN B12: Vitamin B-12: 262 pg/mL (ref 211–911)

## 2013-02-17 ENCOUNTER — Ambulatory Visit (INDEPENDENT_AMBULATORY_CARE_PROVIDER_SITE_OTHER): Payer: BC Managed Care – PPO | Admitting: Family Medicine

## 2013-02-17 ENCOUNTER — Encounter: Payer: Self-pay | Admitting: Family Medicine

## 2013-02-17 VITALS — BP 120/76 | HR 62 | Temp 97.8°F | Ht 71.26 in | Wt 178.5 lb

## 2013-02-17 DIAGNOSIS — R197 Diarrhea, unspecified: Secondary | ICD-10-CM

## 2013-02-17 DIAGNOSIS — L409 Psoriasis, unspecified: Secondary | ICD-10-CM | POA: Insufficient documentation

## 2013-02-17 DIAGNOSIS — I10 Essential (primary) hypertension: Secondary | ICD-10-CM

## 2013-02-17 DIAGNOSIS — Z0000 Encounter for general adult medical examination without abnormal findings: Secondary | ICD-10-CM | POA: Insufficient documentation

## 2013-02-17 DIAGNOSIS — K9089 Other intestinal malabsorption: Secondary | ICD-10-CM

## 2013-02-17 DIAGNOSIS — D518 Other vitamin B12 deficiency anemias: Secondary | ICD-10-CM

## 2013-02-17 DIAGNOSIS — E786 Lipoprotein deficiency: Secondary | ICD-10-CM

## 2013-02-17 DIAGNOSIS — L408 Other psoriasis: Secondary | ICD-10-CM

## 2013-02-17 LAB — VITAMIN D 25 HYDROXY (VIT D DEFICIENCY, FRACTURES): Vit D, 25-Hydroxy: 37 ng/mL (ref 30–89)

## 2013-02-17 MED ORDER — CYANOCOBALAMIN 1000 MCG/ML IJ SOLN
1000.0000 ug | Freq: Once | INTRAMUSCULAR | Status: AC
  Administered 2013-02-17: 1000 ug via INTRAMUSCULAR

## 2013-02-17 MED ORDER — CARVEDILOL 3.125 MG PO TABS: 3.1250 mg | ORAL_TABLET | Freq: Two times a day (BID) | ORAL | Status: DC

## 2013-02-17 NOTE — Assessment & Plan Note (Signed)
Continue cholestipol - discussed dietary approaches to lower triglycerides.

## 2013-02-17 NOTE — Assessment & Plan Note (Signed)
Stable on UVB light box therapy

## 2013-02-17 NOTE — Assessment & Plan Note (Signed)
Preventative protocols reviewed and updated unless pt declined. Discussed healthy diet and lifestyle. Will discontinue prostate screening for now.

## 2013-02-17 NOTE — Assessment & Plan Note (Signed)
b12 shot today - encouraged monthly adherence. Lab Results  Component Value Date   [id number] 016 02/16/2013

## 2013-02-17 NOTE — Patient Instructions (Addendum)
Let's keep an eye on blood pressures and heart rate at home.  May fill carvedilol (coreg) if blood pressure creeping up (goal <140/90) B12 shot today. Good to see you today, call us with questions. Return to see [place] as needed, or in 1 year for next physical.

## 2013-02-17 NOTE — Assessment & Plan Note (Signed)
Good control off meds - will recommend trial off antihypertensives and patient will continue to monitor bp at home - start coreg if running elevated.

## 2013-02-17 NOTE — Assessment & Plan Note (Signed)
Continue cholestipol.

## 2013-02-17 NOTE — Addendum Note (Signed)
Addended by: Emelia Salisbury C on: 02/17/2013 11:32 AM   Modules accepted: Orders

## 2013-02-17 NOTE — Progress Notes (Signed)
Subjective:    Patient ID: Paul Atkinson, male    DOB: Jul 17, 1950, 62 y.o.   MRN: [id number]  HPI CC: CPE   HTN - off carvedilol for last 5 days, noticing blood pressure normal today.  Asks about d/c antihypertensive.  Diarrhea due to bowel resection from crohn's - takes colestipol for this 3-4 times daily.  Has not been taking B12 or vit D regularly.  Last B12 shot was 4-5 wks ago.  Gets monthly, due for rpt today.  Psoriasis mainly on hands - sees Dr Tonia Brooms ([facility]).  Has been using UVB. H/o abnormal moles - recently R back.  Discussed sunscreen use.  No sunburns in the past year  occasional caffeine Lives with wife, no pets Occupation: retired, Medical illustrator Activity: no regular exercise but stays active Diet: good water, fruits/vegetables some  Wt Readings from Last 3 Encounters:  02/17/13 178 lb 8 oz (80.967 kg)  04/11/12 181 lb 12.8 oz (82.464 kg)  04/10/11 180 lb 8 oz (81.874 kg)  Body mass index is 24.71 kg/(m^2).  Preventative: Prostate cancer screening - minor sxs, attributed to enlarged prostate.  Nocturia x1.  Agrees to stop screening for now. colonoscopy - 01/2011, stable.  rec rpt 5 years (Dr. Henrene Pastor) zostavax 2012 Td 2012 Flu shot yearly  Medications and allergies reviewed and updated in chart.  Past histories reviewed and updated if relevant as below. Patient Active Problem List   Diagnosis Date Noted  . LOW HDL 10/04/2009  . SMALL BOWEL OBSTRUCTION 09/30/2008  . DIARRHEA 12/17/2007  . ANEMIA-IRON DEFICIENCY 11/12/2007  . RGN ENTERITIS SMALL INTESTINE W/LG INTESTINE 11/10/2007  . OTHER SPECIFIED INTESTINAL MALABSORPTION 11/10/2007  . HYPERTENSION, BENIGN ESSENTIAL 05/19/2007  . ANEMIA, VITAMIN B12 DEFICIENCY NEC 05/25/2000  . ULCER, ACUTE DUODENAL W/HEMORRHAGE W/O OBST 06/25/1988  . ENTERITIS, REGIONAL NOS 06/26/1987   Past Medical History  Diagnosis Date  . Crohn disease     status post ileocieal ectomy 1989  . Vitamin B 12 deficiency    secondary to ileal resection  . Bile salt-induced diarrhea   . Hypertension   . Anemia     iron def  . Malabsorption   . Disorder of inner ear   . Psoriasis    Past Surgical History  Procedure Laterality Date  . Status post ileocecectomy    . Septoplasty  05/2004    2nd to dev. septum (Dr. Myna Hidalgo)  . Mouth surgery     History  Substance Use Topics  . Smoking status: Never Smoker   . Smokeless tobacco: Never Used  . Alcohol Use: No     Comment: very rarely   Family History  Problem Relation Age of Onset  . Heart disease Mother     CHF, arrhythmias  . Kidney disease Mother     kidney failure  . Hypertension Mother   . Kidney disease Father     renal failure  . CAD Father 77    MI stents  . Diabetes Father 80    on insulin  . Hyperlipidemia Father   . Cancer Neg Hx    Allergies  Allergen Reactions  . Penicillins     REACTION: RASH   Current Outpatient Prescriptions on File Prior to Visit  Medication Sig Dispense Refill  . aspirin 81 MG tablet Take 81 mg by mouth daily.      . colestipol (MICRONIZED COLESTIPOL HCL) 1 G tablet Take one tablet by mouth 4 times a day  120 tablet  3  .  cyanocobalamin (,VITAMIN B-12,) 1000 MCG/ML injection Inject 1,000 mcg into the muscle every 30 (thirty) days. Chronic therapy      . Multiple Vitamin (MULTIVITAMIN) tablet Take 1 tablet by mouth daily.        . cholecalciferol (VITAMIN D) 1000 UNITS tablet Take 1,000 Units by mouth every other day.        No current facility-administered medications on file prior to visit.     Review of Systems  Constitutional: Negative for fever, chills, activity change, appetite change, fatigue and unexpected weight change.  HENT: Negative for hearing loss and neck pain.   Eyes: Negative for visual disturbance.  Respiratory: Negative for cough, chest tightness, shortness of breath and wheezing.   Cardiovascular: Negative for chest pain, palpitations and leg swelling.  Gastrointestinal: Negative  for nausea, vomiting, abdominal pain, diarrhea, constipation, blood in stool and abdominal distention.  Genitourinary: Negative for hematuria and difficulty urinating.  Musculoskeletal: Negative for myalgias and arthralgias.  Skin: Negative for rash.  Neurological: Negative for dizziness, seizures, syncope and headaches.  Hematological: Negative for adenopathy. Does not bruise/bleed easily.  Psychiatric/Behavioral: Negative for dysphoric mood. The patient is not nervous/anxious.        Objective:   Physical Exam  Nursing note and vitals reviewed. Constitutional: He is oriented to person, place, and time. He appears well-developed and well-nourished. No distress.  HENT:  Head: Normocephalic and atraumatic.  Right Ear: Hearing, tympanic membrane, external ear and ear canal normal.  Left Ear: Hearing, tympanic membrane, external ear and ear canal normal.  Nose: Nose normal.  Mouth/Throat: Oropharynx is clear and moist. No oropharyngeal exudate.  Eyes: Conjunctivae and EOM are normal. Pupils are equal, round, and reactive to light. No scleral icterus.  Neck: Normal range of motion. Neck supple. Carotid bruit is not present. No thyromegaly present.  Cardiovascular: Normal rate, regular rhythm, normal heart sounds and intact distal pulses.   No murmur heard. Pulses:      Radial pulses are 2+ on the right side, and 2+ on the left side.  Pulmonary/Chest: Effort normal and breath sounds normal. No respiratory distress. He has no wheezes. He has no rales.  Abdominal: Soft. Bowel sounds are normal. He exhibits no distension and no mass. There is no tenderness. There is no rebound and no guarding.  Musculoskeletal: Normal range of motion. He exhibits no edema.  Lymphadenopathy:    He has no cervical adenopathy.  Neurological: He is alert and oriented to person, place, and time.  CN grossly intact, station and gait intact  Skin: Skin is warm and dry. No rash noted.  Psychiatric: He has a normal  mood and affect. His behavior is normal. Judgment and thought content normal.       Assessment & Plan:

## 2013-02-17 NOTE — Assessment & Plan Note (Signed)
Advised to continue vit B12 and D.  Ferritin stores stable off iron - continue to monitor.

## 2013-02-18 ENCOUNTER — Ambulatory Visit: Payer: BC Managed Care – PPO

## 2013-02-25 ENCOUNTER — Ambulatory Visit: Payer: BC Managed Care – PPO | Admitting: Internal Medicine

## 2013-03-02 ENCOUNTER — Other Ambulatory Visit: Payer: Self-pay | Admitting: Internal Medicine

## 2013-03-02 ENCOUNTER — Other Ambulatory Visit: Payer: Self-pay | Admitting: Family Medicine

## 2013-03-03 ENCOUNTER — Encounter: Payer: Self-pay | Admitting: Internal Medicine

## 2013-03-03 ENCOUNTER — Ambulatory Visit (INDEPENDENT_AMBULATORY_CARE_PROVIDER_SITE_OTHER): Payer: BC Managed Care – PPO | Admitting: Internal Medicine

## 2013-03-03 VITALS — BP 120/72 | HR 64 | Ht 71.5 in | Wt 184.8 lb

## 2013-03-03 DIAGNOSIS — E538 Deficiency of other specified B group vitamins: Secondary | ICD-10-CM

## 2013-03-03 DIAGNOSIS — K9089 Other intestinal malabsorption: Secondary | ICD-10-CM

## 2013-03-03 DIAGNOSIS — K5090 Crohn's disease, unspecified, without complications: Secondary | ICD-10-CM

## 2013-03-03 MED ORDER — COLESTIPOL HCL 1 G PO TABS: 1.0000 g | ORAL_TABLET | Freq: Four times a day (QID) | ORAL | Status: DC

## 2013-03-03 NOTE — Patient Instructions (Addendum)
We have sent the following medications to your pharmacy for you to pick up at your convenience:  Colestipol  Please follow up with Dr. Henrene Pastor 1 year

## 2013-03-03 NOTE — Progress Notes (Signed)
HISTORY OF PRESENT ILLNESS:  Paul Atkinson is a 62 y.o. male with a history of Crohn's colitis for which she is status post ileocecectomy elsewhere greater than 20 years ago. He also has a history of bile salt induced diarrhea for which she takes Colestid with good control of bowel habits. Finally, history of B12 deficiency for which he undergoes regular replacement therapy through his primary provider. He presents today for routine GI followup. He was last seen 04/11/2012. Asymptomatic at that time. His last colonoscopy was performed 02/08/2011. The examination was normal except for an isolated ulcer in the region of the anastomosis which was felt to be nonspecific. His GI review of systems is entirely negative. Review of outside blood work from August reveals unremarkable CBC and comprehensive metabolic panel  REVIEW OF SYSTEMS:  All non-GI ROS negative except for psoriasis  Past Medical History  Diagnosis Date  . Crohn disease     status post ileocieal ectomy 1989  . Vitamin B 12 deficiency     secondary to ileal resection  . Bile salt-induced diarrhea   . Hypertension   . Anemia     iron def  . Malabsorption   . Disorder of inner ear   . Psoriasis     Past Surgical History  Procedure Laterality Date  . Status post ileocecectomy    . Septoplasty  05/2004    2nd to dev. septum (Dr. Myna Hidalgo)  . Mouth surgery    . Mole removal      back    Social History [name]  reports that he has never smoked. He has never used smokeless tobacco. He reports that he does not drink alcohol or use illicit drugs.  family history includes CAD (age of onset: 52) in his father; Diabetes (age of onset: 25) in his father; Heart disease in his mother; Hyperlipidemia in his father; Hypertension in his mother; Kidney disease in his father and mother. There is no history of Cancer.  Allergies  Allergen Reactions  . Penicillins     REACTION: RASH       PHYSICAL EXAMINATION: Vital signs:  BP 120/72  Pulse 64  Ht 5' 11.5" (1.816 m)  Wt 184 lb 12.8 oz (83.825 kg)  BMI 25.42 kg/m2 General: Well-developed, well-nourished, no acute distress HEENT: Sclerae are anicteric, conjunctiva pink. Oral mucosa intact Lungs: Clear Heart: Regular Abdomen: soft, nontender, nondistended, no obvious ascites, no peritoneal signs, normal bowel sounds. No organomegaly. Extremities: No edema Psychiatric: alert and oriented x3. Cooperative     ASSESSMENT:  #1. Crohn's ileocolitis status post remote ileocecectomy. He remains asymptomatic off therapy. Last colonoscopy 2012 #2. Bile salt induced diarrhea managed with Colestid #3. B12 deficiency secondary to ileal resection. Replacement therapy for his PCP     PLAN:  #1. Continue active surveillance #2. Refill Colestid as requested #3. Continue B12 replacement with PCP #4. Routine GI followup in 1 year. Anticipate relook colonoscopy around August 2016

## 2013-03-05 ENCOUNTER — Encounter: Payer: Self-pay | Admitting: Family Medicine

## 2013-04-28 ENCOUNTER — Ambulatory Visit (INDEPENDENT_AMBULATORY_CARE_PROVIDER_SITE_OTHER): Payer: BC Managed Care – PPO

## 2013-04-28 DIAGNOSIS — E538 Deficiency of other specified B group vitamins: Secondary | ICD-10-CM

## 2013-04-28 MED ORDER — CYANOCOBALAMIN 1000 MCG/ML IJ SOLN
1000.0000 ug | Freq: Once | INTRAMUSCULAR | Status: AC
  Administered 2013-04-28: 1000 ug via INTRAMUSCULAR

## 2013-06-11 ENCOUNTER — Ambulatory Visit (INDEPENDENT_AMBULATORY_CARE_PROVIDER_SITE_OTHER): Payer: BC Managed Care – PPO

## 2013-06-11 DIAGNOSIS — E538 Deficiency of other specified B group vitamins: Secondary | ICD-10-CM

## 2013-06-11 MED ORDER — CYANOCOBALAMIN 1000 MCG/ML IJ SOLN
1000.0000 ug | Freq: Once | INTRAMUSCULAR | Status: AC
  Administered 2013-06-11: 1000 ug via INTRAMUSCULAR

## 2013-07-10 ENCOUNTER — Other Ambulatory Visit: Payer: Self-pay | Admitting: Family Medicine

## 2013-07-10 ENCOUNTER — Other Ambulatory Visit: Payer: Self-pay | Admitting: Internal Medicine

## 2013-09-23 ENCOUNTER — Ambulatory Visit (INDEPENDENT_AMBULATORY_CARE_PROVIDER_SITE_OTHER): Payer: BC Managed Care – PPO

## 2013-09-23 DIAGNOSIS — E538 Deficiency of other specified B group vitamins: Secondary | ICD-10-CM

## 2013-09-23 MED ORDER — CYANOCOBALAMIN 1000 MCG/ML IJ SOLN
1000.0000 ug | Freq: Once | INTRAMUSCULAR | Status: AC
  Administered 2013-09-23: 1000 ug via INTRAMUSCULAR

## 2013-12-18 ENCOUNTER — Ambulatory Visit (INDEPENDENT_AMBULATORY_CARE_PROVIDER_SITE_OTHER): Payer: BC Managed Care – PPO

## 2013-12-18 DIAGNOSIS — E538 Deficiency of other specified B group vitamins: Secondary | ICD-10-CM

## 2013-12-18 MED ORDER — CYANOCOBALAMIN 1000 MCG/ML IJ SOLN
1000.0000 ug | Freq: Once | INTRAMUSCULAR | Status: AC
  Administered 2013-12-18: 1000 ug via INTRAMUSCULAR

## 2014-03-04 ENCOUNTER — Ambulatory Visit (INDEPENDENT_AMBULATORY_CARE_PROVIDER_SITE_OTHER): Payer: BC Managed Care – PPO

## 2014-03-04 DIAGNOSIS — E538 Deficiency of other specified B group vitamins: Secondary | ICD-10-CM

## 2014-03-04 MED ORDER — CYANOCOBALAMIN 1000 MCG/ML IJ SOLN
1000.0000 ug | Freq: Once | INTRAMUSCULAR | Status: AC
  Administered 2014-03-04: 1000 ug via INTRAMUSCULAR

## 2014-03-15 ENCOUNTER — Other Ambulatory Visit: Payer: Self-pay | Admitting: Family Medicine

## 2014-03-30 ENCOUNTER — Encounter: Payer: Self-pay | Admitting: Internal Medicine

## 2014-03-30 ENCOUNTER — Ambulatory Visit (INDEPENDENT_AMBULATORY_CARE_PROVIDER_SITE_OTHER): Payer: BC Managed Care – PPO | Admitting: Internal Medicine

## 2014-03-30 VITALS — BP 118/72 | HR 72 | Ht 71.5 in | Wt 188.0 lb

## 2014-03-30 DIAGNOSIS — K909 Intestinal malabsorption, unspecified: Secondary | ICD-10-CM

## 2014-03-30 DIAGNOSIS — E538 Deficiency of other specified B group vitamins: Secondary | ICD-10-CM

## 2014-03-30 DIAGNOSIS — K5090 Crohn's disease, unspecified, without complications: Secondary | ICD-10-CM

## 2014-03-30 DIAGNOSIS — K9089 Other intestinal malabsorption: Secondary | ICD-10-CM

## 2014-03-30 MED ORDER — COLESTIPOL HCL 1 G PO TABS: 4.0000 g | ORAL_TABLET | Freq: Every day | ORAL | Status: DC

## 2014-03-30 NOTE — Patient Instructions (Signed)
We have sent the following medications to your pharmacy for you to pick up at your convenience:  Colestid  Please follow up with Dr. Henrene Pastor in one year

## 2014-03-30 NOTE — Progress Notes (Signed)
HISTORY OF PRESENT ILLNESS:  Paul Atkinson is a 63 y.o. male with a history of Crohn's ileocolitis for which he is status post ileocecectomy at [facility] greater than 20 years ago. He has a history of bile salt induced diarrhea for which he takes Colestid with good results. Finally, a history of B12 deficiency for which he undergoes replacement therapy from his primary care provider. He presents today for routine GI followup. He was last seen 03/03/2013 at which time he was asymptomatic. He has had no interval problems. GI review of systems is negative. His last colonoscopy was performed August 2012. HEENT followup in 5 years recommended. At the time of his last colonoscopy, ulceration of the anastomosis noted, but no other abnormalities or evidence for significant Crohn's disease. He does request medication refill. He is seeing his PCP in the future with anticipated blood work.  REVIEW OF SYSTEMS:  All non-GI ROS negative except for psoriasis, hearing problems, muscle cramps  Past Medical History  Diagnosis Date  . Crohn disease     status post ileocecectomy 1989  . Vitamin B 12 deficiency     secondary to ileal resection  . Bile salt-induced diarrhea   . Hypertension   . Anemia     iron def  . Malabsorption   . Disorder of inner ear   . Psoriasis   . Hearing loss     Past Surgical History  Procedure Laterality Date  . Status post ileocecectomy    . Septoplasty  05/2004    2nd to dev. septum (Dr. Myna Hidalgo)  . Mouth surgery    . Mole removal      back    Social History Paul Atkinson  reports that he has never smoked. He has never used smokeless tobacco. He reports that he does not drink alcohol or use illicit drugs.  family history includes CAD (age of onset: 62) in his father; Diabetes (age of onset: 80) in his father; Heart disease in his mother; Hyperlipidemia in his father; Hypertension in his mother; Kidney disease in his father and mother. There is no history of  Cancer.  Allergies  Allergen Reactions  . Penicillins     REACTION: RASH       PHYSICAL EXAMINATION: Vital signs: BP 118/72  Pulse 72  Ht 5' 11.5" (1.816 m)  Wt 188 lb (85.276 kg)  BMI 25.86 kg/m2 General: Well-developed, well-nourished, no acute distress HEENT: Sclerae are anicteric, conjunctiva pink. Oral mucosa intact Lungs: Clear Heart: Regular Abdomen: soft, nontender, nondistended, no obvious ascites, no peritoneal signs, normal bowel sounds. No organomegaly. Prior surgical incisions well-healed without evidence of hernia Extremities: No edema Psychiatric: alert and oriented x3. Cooperative   ASSESSMENT:  #1. Crohn's ileocolitis status post remote ileocecectomy. He remains asymptomatic off medical therapy. Last colonoscopy 2012 without evidence of disease #2. Bile salt induced diarrhea managed with Colestid #3. B12 deficiency secondary to ileal resection. Replacement therapy per PCP  PLAN:  #1. Refill Colestid as requested #2. Continue with active surveillance #3. Ongoing B12 replacement with PCP #4. Routine GI followup in 1 year #5. Anticipate relook surveillance colonoscopy around August 2017.

## 2014-04-06 ENCOUNTER — Ambulatory Visit (INDEPENDENT_AMBULATORY_CARE_PROVIDER_SITE_OTHER): Payer: BC Managed Care – PPO

## 2014-04-06 DIAGNOSIS — E538 Deficiency of other specified B group vitamins: Secondary | ICD-10-CM

## 2014-04-06 MED ORDER — CYANOCOBALAMIN 1000 MCG/ML IJ SOLN
1000.0000 ug | Freq: Once | INTRAMUSCULAR | Status: AC
  Administered 2014-04-06: 1000 ug via INTRAMUSCULAR

## 2014-04-12 ENCOUNTER — Other Ambulatory Visit: Payer: Self-pay | Admitting: Family Medicine

## 2014-05-07 ENCOUNTER — Other Ambulatory Visit (INDEPENDENT_AMBULATORY_CARE_PROVIDER_SITE_OTHER): Payer: BC Managed Care – PPO

## 2014-05-07 ENCOUNTER — Other Ambulatory Visit: Payer: Self-pay | Admitting: Family Medicine

## 2014-05-07 DIAGNOSIS — K5080 Crohn's disease of both small and large intestine without complications: Secondary | ICD-10-CM | POA: Diagnosis not present

## 2014-05-07 DIAGNOSIS — I10 Essential (primary) hypertension: Secondary | ICD-10-CM

## 2014-05-07 DIAGNOSIS — D518 Other vitamin B12 deficiency anemias: Secondary | ICD-10-CM

## 2014-05-07 DIAGNOSIS — E785 Hyperlipidemia, unspecified: Secondary | ICD-10-CM

## 2014-05-07 DIAGNOSIS — D509 Iron deficiency anemia, unspecified: Secondary | ICD-10-CM | POA: Diagnosis not present

## 2014-05-07 DIAGNOSIS — Z125 Encounter for screening for malignant neoplasm of prostate: Secondary | ICD-10-CM

## 2014-05-07 LAB — COMPREHENSIVE METABOLIC PANEL
ALBUMIN: 3.5 g/dL (ref 3.5–5.2)
ALK PHOS: 60 U/L (ref 39–117)
ALT: 14 U/L (ref 0–53)
AST: 23 U/L (ref 0–37)
BUN: 15 mg/dL (ref 6–23)
CO2: 23 mEq/L (ref 19–32)
Calcium: 9 mg/dL (ref 8.4–10.5)
Chloride: 107 mEq/L (ref 96–112)
Creatinine, Ser: 1 mg/dL (ref 0.4–1.5)
GFR: 80.79 mL/min (ref >60.00)
GLUCOSE: 129 mg/dL — AB (ref 70–99)
POTASSIUM: 4.4 meq/L (ref 3.5–5.1)
SODIUM: 140 meq/L (ref 135–145)
Total Bilirubin: 1.8 mg/dL — ABNORMAL HIGH (ref 0.2–1.2)
Total Protein: 6.6 g/dL (ref 6.0–8.3)

## 2014-05-07 LAB — CBC WITH DIFFERENTIAL/PLATELET
BASOS ABS: 0 10*3/uL (ref 0.0–0.1)
Basophils Relative: 0.4 % (ref 0.0–3.0)
EOS ABS: 0.2 10*3/uL (ref 0.0–0.7)
Eosinophils Relative: 4.4 % (ref 0.0–5.0)
HCT: 42 % (ref 39.0–52.0)
Hemoglobin: 13.9 g/dL (ref 13.0–17.0)
Lymphocytes Relative: 16.7 % (ref 12.0–46.0)
Lymphs Abs: 0.9 10*3/uL (ref 0.7–4.0)
MCHC: 33.2 g/dL (ref 30.0–36.0)
MCV: 92.6 fl (ref 78.0–100.0)
MONO ABS: 0.6 10*3/uL (ref 0.1–1.0)
Monocytes Relative: 10.7 % (ref 3.0–12.0)
NEUTROS PCT: 67.8 % (ref 43.0–77.0)
Neutro Abs: 3.5 10*3/uL (ref 1.4–7.7)
Platelets: 209 10*3/uL (ref 150.0–400.0)
RBC: 4.54 Mil/uL (ref 4.22–5.81)
RDW: 14.8 % (ref 11.5–15.5)
WBC: 5.1 10*3/uL (ref 4.0–10.5)

## 2014-05-07 LAB — IBC PANEL
IRON: 163 ug/dL (ref 42–165)
Saturation Ratios: 38.3 % (ref 20.0–50.0)
Transferrin: 303.7 mg/dL (ref 212.0–360.0)

## 2014-05-07 LAB — VITAMIN B12: Vitamin B-12: 318 pg/mL (ref 211–911)

## 2014-05-07 LAB — LIPID PANEL
CHOLESTEROL: 134 mg/dL (ref 0–200)
HDL: 39.3 mg/dL (ref >39.00)
LDL CALC: 59 mg/dL (ref 0–99)
NonHDL: 94.7
TRIGLYCERIDES: 180 mg/dL — AB (ref 0.0–149.0)
Total CHOL/HDL Ratio: 3
VLDL: 36 mg/dL (ref 0.0–40.0)

## 2014-05-11 ENCOUNTER — Ambulatory Visit: Payer: BC Managed Care – PPO

## 2014-05-11 ENCOUNTER — Encounter: Payer: Self-pay | Admitting: Family Medicine

## 2014-05-11 ENCOUNTER — Ambulatory Visit (INDEPENDENT_AMBULATORY_CARE_PROVIDER_SITE_OTHER): Payer: BC Managed Care – PPO | Admitting: Family Medicine

## 2014-05-11 VITALS — BP 126/78 | HR 64 | Temp 97.9°F | Ht 71.25 in | Wt 185.0 lb

## 2014-05-11 DIAGNOSIS — L409 Psoriasis, unspecified: Secondary | ICD-10-CM

## 2014-05-11 DIAGNOSIS — K9089 Other intestinal malabsorption: Secondary | ICD-10-CM

## 2014-05-11 DIAGNOSIS — E785 Hyperlipidemia, unspecified: Secondary | ICD-10-CM

## 2014-05-11 DIAGNOSIS — K5080 Crohn's disease of both small and large intestine without complications: Secondary | ICD-10-CM

## 2014-05-11 DIAGNOSIS — I10 Essential (primary) hypertension: Secondary | ICD-10-CM

## 2014-05-11 DIAGNOSIS — E538 Deficiency of other specified B group vitamins: Secondary | ICD-10-CM

## 2014-05-11 DIAGNOSIS — Z0000 Encounter for general adult medical examination without abnormal findings: Secondary | ICD-10-CM

## 2014-05-11 MED ORDER — CYANOCOBALAMIN 1000 MCG/ML IJ SOLN
1000.0000 ug | Freq: Once | INTRAMUSCULAR | Status: AC
  Administered 2014-05-11: 1000 ug via INTRAMUSCULAR

## 2014-05-11 NOTE — Addendum Note (Signed)
Addended by: Royann Shivers A on: 05/11/2014 08:38 AM   Modules accepted: Orders

## 2014-05-11 NOTE — Assessment & Plan Note (Signed)
Preventative protocols reviewed and updated unless pt declined. Discussed healthy diet and lifestyle.  

## 2014-05-11 NOTE — Assessment & Plan Note (Signed)
Continue home light box therapy.

## 2014-05-11 NOTE — Progress Notes (Signed)
Pre visit review using our clinic review tool, if applicable. No additional management support is needed unless otherwise documented below in the visit note. 

## 2014-05-11 NOTE — Assessment & Plan Note (Signed)
Stable. Followed by GI.

## 2014-05-11 NOTE — Assessment & Plan Note (Signed)
Controlled on colestipol

## 2014-05-11 NOTE — Assessment & Plan Note (Signed)
Mild hypertriglyceridemia controlled with diet. Discussed increasing fatty fish.

## 2014-05-11 NOTE — Assessment & Plan Note (Signed)
Chronic, stable, continue coreg.

## 2014-05-11 NOTE — Progress Notes (Signed)
BP 126/78 mmHg  Pulse 64  Temp(Src) 97.9 F (36.6 C) (Oral)  Ht 5' 11.25" (1.81 m)  Wt 185 lb (83.915 kg)  BMI 25.61 kg/m2   CC: CPE  Subjective:    Patient ID: Paul Atkinson, male    DOB: May 01, 1951, 63 y.o.   MRN: [id number]  HPI: Paul Atkinson is a 63 y.o. male presenting on 05/11/2014 for Annual Exam   H/o crohn's ileocolitis s/p ilocecectomy along with bile salt -induced diarrhea on colestipol. Receives monthly B12 shots at our office.  Lab Results  Component Value Date   VITAMINB12 318 05/07/2014   Wt Readings from Last 3 Encounters:  05/11/14 185 lb (83.915 kg)  03/30/14 188 lb (85.276 kg)  03/03/13 184 lb 12.8 oz (83.825 kg)  Body mass index is 25.61 kg/(m^2).  Psoriasis mainly on hands - sees derm (Dr. Tonia Brooms), gets UVB therapy QOD home therapy. Also uses steroid creams. No joint pains.   Preventative: Prostate cancer screening - minor sxs, attributed to enlarged prostate. Nocturia x1. Agrees to stop screening for now. colonoscopy - 01/2011, stable. rec rpt 5 years (Dr. Henrene Pastor) Flu shot yearly zostavax 2012 Td 2012 Discussed sunscreen use. No sunburns in the past year Discussed seat belt use.  Occasional caffeine  Lives with wife, no pets  Occupation: retired, Medical illustrator, Actuary  Activity: no regular exercise but stays active  Diet: good water, fruits/vegetables some   Relevant past medical, surgical, family and social history reviewed and updated as indicated.  Allergies and medications reviewed and updated. Current Outpatient Prescriptions on File Prior to Visit  Medication Sig  . aspirin 81 MG tablet Take 81 mg by mouth daily.  . carvedilol (COREG) 3.125 MG tablet TAKE 1 TABLET (3.125 MG TOTAL) BY MOUTH 2 (TWO) TIMES DAILY WITH A MEAL.  . colestipol (MICRONIZED COLESTIPOL HCL) 1 G tablet Take 4 tablets (4 g total) by mouth daily.  . cyanocobalamin (,VITAMIN B-12,) 1000 MCG/ML injection Inject 1,000 mcg into the muscle every 30  (thirty) days. Chronic therapy  . Multiple Vitamin (MULTIVITAMIN) tablet Take 1 tablet by mouth daily.     No current facility-administered medications on file prior to visit.    Review of Systems  Constitutional: Negative for fever, chills, activity change, appetite change, fatigue and unexpected weight change.  HENT: Negative for hearing loss.   Eyes: Negative for visual disturbance.  Respiratory: Negative for cough, chest tightness, shortness of breath and wheezing.   Cardiovascular: Negative for chest pain, palpitations and leg swelling.  Gastrointestinal: Positive for diarrhea (mild, crohns' related). Negative for nausea, vomiting, abdominal pain, constipation, blood in stool and abdominal distention.  Genitourinary: Negative for hematuria and difficulty urinating.  Musculoskeletal: Negative for myalgias, arthralgias and neck pain.  Skin: Negative for rash.  Neurological: Negative for dizziness, seizures, syncope and headaches.  Hematological: Negative for adenopathy. Does not bruise/bleed easily.  Psychiatric/Behavioral: Negative for dysphoric mood. The patient is not nervous/anxious.    Per HPI unless specifically indicated above    Objective:    BP 126/78 mmHg  Pulse 64  Temp(Src) 97.9 F (36.6 C) (Oral)  Ht 5' 11.25" (1.81 m)  Wt 185 lb (83.915 kg)  BMI 25.61 kg/m2  Physical Exam  Constitutional: He is oriented to person, place, and time. He appears well-developed and well-nourished. No distress.  HENT:  Head: Normocephalic and atraumatic.  Right Ear: Hearing, tympanic membrane, external ear and ear canal normal.  Left Ear: Hearing, tympanic membrane, external ear and ear canal  normal.  Nose: Nose normal.  Mouth/Throat: Uvula is midline, oropharynx is clear and moist and mucous membranes are normal. No oropharyngeal exudate, posterior oropharyngeal edema or posterior oropharyngeal erythema.  Eyes: Conjunctivae and EOM are normal. Pupils are equal, round, and reactive  to light. No scleral icterus.  Neck: Normal range of motion. Neck supple. Carotid bruit is not present. No thyromegaly present.  Cardiovascular: Normal rate, regular rhythm, normal heart sounds and intact distal pulses.   No murmur heard. Pulses:      Radial pulses are 2+ on the right side, and 2+ on the left side.  Pulmonary/Chest: Effort normal and breath sounds normal. No respiratory distress. He has no wheezes. He has no rales.  Abdominal: Soft. Bowel sounds are normal. He exhibits no distension and no mass. There is no tenderness. There is no rebound and no guarding.  Musculoskeletal: Normal range of motion. He exhibits no edema.  Lymphadenopathy:    He has no cervical adenopathy.  Neurological: He is alert and oriented to person, place, and time.  CN grossly intact, station and gait intact  Skin: Skin is warm and dry. No rash noted.  Psychiatric: He has a normal mood and affect. His behavior is normal. Judgment and thought content normal.  Nursing note and vitals reviewed.  Results for orders placed or performed in visit on 05/07/14  Lipid panel  Result Value Ref Range   Cholesterol 134 0 - 200 mg/dL   Triglycerides 180.0 (H) 0.0 - 149.0 mg/dL   HDL 39.30 >39.00 mg/dL   VLDL 36.0 0.0 - 40.0 mg/dL   LDL Cholesterol 59 0 - 99 mg/dL   Total CHOL/HDL Ratio 3    NonHDL 94.70   Vitamin B12  Result Value Ref Range   Vitamin B-12 318 211 - 911 pg/mL  IBC panel  Result Value Ref Range   Iron 163 42 - 165 ug/dL   Transferrin 303.7 212.0 - 360.0 mg/dL   Saturation Ratios 38.3 20.0 - 50.0 %  Comprehensive metabolic panel  Result Value Ref Range   Sodium 140 135 - 145 mEq/L   Potassium 4.4 3.5 - 5.1 mEq/L   Chloride 107 96 - 112 mEq/L   CO2 23 19 - 32 mEq/L   Glucose, Bld 129 (H) 70 - 99 mg/dL   BUN 15 6 - 23 mg/dL   Creatinine, Ser 1.0 0.4 - 1.5 mg/dL   Total Bilirubin 1.8 (H) 0.2 - 1.2 mg/dL   Alkaline Phosphatase 60 39 - 117 U/L   AST 23 0 - 37 U/L   ALT 14 0 - 53 U/L    Total Protein 6.6 6.0 - 8.3 g/dL   Albumin 3.5 3.5 - 5.2 g/dL   Calcium 9.0 8.4 - 10.5 mg/dL   GFR 80.79 >60.00 mL/min  CBC with Differential  Result Value Ref Range   WBC 5.1 4.0 - 10.5 K/uL   RBC 4.54 4.22 - 5.81 Mil/uL   Hemoglobin 13.9 13.0 - 17.0 g/dL   HCT 42.0 39.0 - 52.0 %   MCV 92.6 78.0 - 100.0 fl   MCHC 33.2 30.0 - 36.0 g/dL   RDW 14.8 11.5 - 15.5 %   Platelets 209.0 150.0 - 400.0 K/uL   Neutrophils Relative % 67.8 43.0 - 77.0 %   Lymphocytes Relative 16.7 12.0 - 46.0 %   Monocytes Relative 10.7 3.0 - 12.0 %   Eosinophils Relative 4.4 0.0 - 5.0 %   Basophils Relative 0.4 0.0 - 3.0 %   Neutro Abs  3.5 1.4 - 7.7 K/uL   Lymphs Abs 0.9 0.7 - 4.0 K/uL   Monocytes Absolute 0.6 0.1 - 1.0 K/uL   Eosinophils Absolute 0.2 0.0 - 0.7 K/uL   Basophils Absolute 0.0 0.0 - 0.1 K/uL      Assessment & Plan:   Problem List Items Addressed This Visit    Psoriasis    Continue home light box therapy.    HYPERTENSION, BENIGN ESSENTIAL    Chronic, stable, continue coreg.    Healthcare maintenance - Primary    Preventative protocols reviewed and updated unless pt declined. Discussed healthy diet and lifestyle.     Dyslipidemia    Mild hypertriglyceridemia controlled with diet. Discussed increasing fatty fish.    Crohn's ileocolitis s/p remote ileostomy    Stable. Followed by GI.    Bile salt-induced diarrhea    Controlled on colestipol        Follow up plan: Return in about 1 year (around 05/12/2015), or as needed, for annual exam, prior fasting for blood work.

## 2014-05-11 NOTE — Patient Instructions (Signed)
Good to see you today, call us with questions. Return as needed or in 1 year for next physical or welcome to medicare visit.Marland Kitchen

## 2014-05-12 ENCOUNTER — Telehealth: Payer: Self-pay | Admitting: Family Medicine

## 2014-05-12 NOTE — Telephone Encounter (Signed)
emmi emailed °

## 2014-05-22 ENCOUNTER — Other Ambulatory Visit: Payer: Self-pay | Admitting: Family Medicine

## 2014-06-11 ENCOUNTER — Ambulatory Visit (INDEPENDENT_AMBULATORY_CARE_PROVIDER_SITE_OTHER): Payer: BC Managed Care – PPO

## 2014-06-11 DIAGNOSIS — E538 Deficiency of other specified B group vitamins: Secondary | ICD-10-CM

## 2014-06-11 MED ORDER — CYANOCOBALAMIN 1000 MCG/ML IJ SOLN
1000.0000 ug | Freq: Once | INTRAMUSCULAR | Status: AC
  Administered 2014-06-11: 1000 ug via INTRAMUSCULAR

## 2014-07-14 ENCOUNTER — Ambulatory Visit (INDEPENDENT_AMBULATORY_CARE_PROVIDER_SITE_OTHER): Payer: BC Managed Care – PPO | Admitting: *Deleted

## 2014-07-14 DIAGNOSIS — D519 Vitamin B12 deficiency anemia, unspecified: Secondary | ICD-10-CM

## 2014-07-14 MED ORDER — CYANOCOBALAMIN 1000 MCG/ML IJ SOLN
1000.0000 ug | Freq: Once | INTRAMUSCULAR | Status: AC
  Administered 2014-07-14: 1000 ug via INTRAMUSCULAR

## 2014-08-17 ENCOUNTER — Ambulatory Visit (INDEPENDENT_AMBULATORY_CARE_PROVIDER_SITE_OTHER): Payer: BC Managed Care – PPO

## 2014-08-17 DIAGNOSIS — E538 Deficiency of other specified B group vitamins: Secondary | ICD-10-CM

## 2014-08-17 MED ORDER — CYANOCOBALAMIN 1000 MCG/ML IJ SOLN
1000.0000 ug | Freq: Once | INTRAMUSCULAR | Status: AC
  Administered 2014-08-17: 1000 ug via INTRAMUSCULAR

## 2014-09-16 ENCOUNTER — Other Ambulatory Visit: Payer: Self-pay | Admitting: Family Medicine

## 2014-09-16 ENCOUNTER — Ambulatory Visit (INDEPENDENT_AMBULATORY_CARE_PROVIDER_SITE_OTHER): Payer: BC Managed Care – PPO

## 2014-09-16 DIAGNOSIS — E538 Deficiency of other specified B group vitamins: Secondary | ICD-10-CM

## 2014-09-16 MED ORDER — CYANOCOBALAMIN 1000 MCG/ML IJ SOLN
1000.0000 ug | Freq: Once | INTRAMUSCULAR | Status: AC
  Administered 2014-09-16: 1000 ug via INTRAMUSCULAR

## 2014-10-19 ENCOUNTER — Ambulatory Visit (INDEPENDENT_AMBULATORY_CARE_PROVIDER_SITE_OTHER): Payer: BC Managed Care – PPO | Admitting: *Deleted

## 2014-10-19 DIAGNOSIS — E538 Deficiency of other specified B group vitamins: Secondary | ICD-10-CM

## 2014-10-19 MED ORDER — CYANOCOBALAMIN 1000 MCG/ML IJ SOLN
1000.0000 ug | Freq: Once | INTRAMUSCULAR | Status: AC
  Administered 2014-10-19: 1000 ug via INTRAMUSCULAR

## 2014-11-19 ENCOUNTER — Ambulatory Visit (INDEPENDENT_AMBULATORY_CARE_PROVIDER_SITE_OTHER): Payer: BC Managed Care – PPO | Admitting: *Deleted

## 2014-11-19 DIAGNOSIS — E538 Deficiency of other specified B group vitamins: Secondary | ICD-10-CM | POA: Diagnosis not present

## 2014-11-19 MED ORDER — CYANOCOBALAMIN 1000 MCG/ML IJ SOLN
1000.0000 ug | Freq: Once | INTRAMUSCULAR | Status: AC
  Administered 2014-11-19: 1000 ug via INTRAMUSCULAR

## 2014-12-21 ENCOUNTER — Ambulatory Visit (INDEPENDENT_AMBULATORY_CARE_PROVIDER_SITE_OTHER): Payer: Medicare Other

## 2014-12-21 DIAGNOSIS — E538 Deficiency of other specified B group vitamins: Secondary | ICD-10-CM

## 2014-12-21 MED ORDER — CYANOCOBALAMIN 1000 MCG/ML IJ SOLN
1000.0000 ug | Freq: Once | INTRAMUSCULAR | Status: AC
  Administered 2014-12-21: 1000 ug via INTRAMUSCULAR

## 2015-01-20 DIAGNOSIS — D225 Melanocytic nevi of trunk: Secondary | ICD-10-CM | POA: Diagnosis not present

## 2015-01-20 DIAGNOSIS — L409 Psoriasis, unspecified: Secondary | ICD-10-CM | POA: Diagnosis not present

## 2015-01-20 DIAGNOSIS — L821 Other seborrheic keratosis: Secondary | ICD-10-CM | POA: Diagnosis not present

## 2015-01-20 DIAGNOSIS — Z85828 Personal history of other malignant neoplasm of skin: Secondary | ICD-10-CM | POA: Diagnosis not present

## 2015-01-25 ENCOUNTER — Ambulatory Visit (INDEPENDENT_AMBULATORY_CARE_PROVIDER_SITE_OTHER): Payer: Medicare Other

## 2015-01-25 DIAGNOSIS — E538 Deficiency of other specified B group vitamins: Secondary | ICD-10-CM

## 2015-01-25 MED ORDER — CYANOCOBALAMIN 1000 MCG/ML IJ SOLN
1000.0000 ug | Freq: Once | INTRAMUSCULAR | Status: AC
  Administered 2015-01-25: 1000 ug via INTRAMUSCULAR

## 2015-01-27 ENCOUNTER — Other Ambulatory Visit: Payer: Self-pay | Admitting: Family Medicine

## 2015-02-16 ENCOUNTER — Other Ambulatory Visit: Payer: Self-pay | Admitting: Internal Medicine

## 2015-03-01 ENCOUNTER — Ambulatory Visit (INDEPENDENT_AMBULATORY_CARE_PROVIDER_SITE_OTHER): Payer: Medicare Other | Admitting: *Deleted

## 2015-03-01 DIAGNOSIS — D519 Vitamin B12 deficiency anemia, unspecified: Secondary | ICD-10-CM | POA: Diagnosis not present

## 2015-03-01 MED ORDER — CYANOCOBALAMIN 1000 MCG/ML IJ SOLN
1000.0000 ug | Freq: Once | INTRAMUSCULAR | Status: AC
  Administered 2015-03-01: 1000 ug via INTRAMUSCULAR

## 2015-03-31 ENCOUNTER — Ambulatory Visit (INDEPENDENT_AMBULATORY_CARE_PROVIDER_SITE_OTHER): Payer: Medicare Other | Admitting: Internal Medicine

## 2015-03-31 ENCOUNTER — Encounter: Payer: Self-pay | Admitting: Internal Medicine

## 2015-03-31 VITALS — BP 140/70 | HR 70 | Ht 71.25 in | Wt 181.6 lb

## 2015-03-31 DIAGNOSIS — K5010 Crohn's disease of large intestine without complications: Secondary | ICD-10-CM

## 2015-03-31 DIAGNOSIS — K909 Intestinal malabsorption, unspecified: Secondary | ICD-10-CM

## 2015-03-31 DIAGNOSIS — K9089 Other intestinal malabsorption: Secondary | ICD-10-CM

## 2015-03-31 DIAGNOSIS — E538 Deficiency of other specified B group vitamins: Secondary | ICD-10-CM | POA: Diagnosis not present

## 2015-03-31 MED ORDER — COLESTIPOL HCL 1 G PO TABS: ORAL_TABLET | ORAL | Status: DC

## 2015-03-31 NOTE — Patient Instructions (Signed)
We have sent the following medications to your pharmacy for you to pick up at your convenience:  Colestid

## 2015-03-31 NOTE — Progress Notes (Signed)
HISTORY OF PRESENT ILLNESS:  Paul Atkinson is a 64 y.o. male with a history of Crohn's ileocolitis for which she is status post ileocecectomy at [facility] greater than 20 years ago. He has a history of bile salt induced diarrhea for which he takes Colestid with good results. She does have urgency but formed stools. He also has a history of B12 deficiency for which she takes for placement therapy under the guidance of his primary care provider. He presents today for routine GI follow-up. He is pleased report that he has had no interval medical problems and no significant abdominal complaints. No pain, bleeding, or weight loss. His last colonoscopy was performed August 2012 and was unremarkable except for ulceration at the anastomosis felt to represent Crohn's disease. He is due for follow-up colonoscopy next year. He does request refill of his medication. Review of outside data shows laboratories from November 2015 with unremarkable CBC with hemoglobin 13.9 and unremarkable comprehensive metabolic panel.  REVIEW OF SYSTEMS:  All non-GI ROS negative except for arthritis, hearing impairment, psoriasis, muscle cramps  Past Medical History  Diagnosis Date  . Crohn disease ([place])     status post ileocecectomy 1989  . Vitamin B 12 deficiency     secondary to ileal resection  . Bile salt-induced diarrhea   . Hypertension   . Anemia     iron def  . Malabsorption   . Disorder of inner ear   . Psoriasis   . Hearing loss   . Hx SBO   . Hx of duodenal ulcer     Past Surgical History  Procedure Laterality Date  . Ileocecetomy  1989    peritonitis with SBO from crohn's  . Septoplasty  05/2004    2nd to dev. septum (Dr. Myna Hidalgo)  . Mouth surgery    . Mole removal      back    Social History Paul Atkinson  reports that he has never smoked. He has never used smokeless tobacco. He reports that he does not drink alcohol or use illicit drugs.  family history includes CAD (age of onset: 80) in his  father; Diabetes (age of onset: 63) in his father; Heart disease in his mother; Hyperlipidemia in his father; Hypertension in his mother; Kidney disease in his father and mother. There is no history of Cancer or Colon cancer.  Allergies  Allergen Reactions  . Penicillins     REACTION: RASH in throat       PHYSICAL EXAMINATION: Vital signs: BP 140/70 mmHg  Pulse 70  Ht 5' 11.25" (1.81 m)  Wt 181 lb 9.6 oz (82.373 kg)  BMI 25.14 kg/m2  Constitutional: generally well-appearing, no acute distress Psychiatric: alert and oriented x3, cooperative Eyes: extraocular movements intact, anicteric, conjunctiva pink Mouth: oral pharynx moist, no lesions Neck: supple no lymphadenopathy Cardiovascular: heart regular rate and rhythm, no murmur Lungs: clear to auscultation bilaterally Abdomen: soft, nontender, nondistended, no obvious ascites, no peritoneal signs, normal bowel sounds, no organomegaly. Previous surgical incision well-healed. No hernia. Rectal: Omitted Extremities: no clubbing cyanosis or lower extremity edema bilaterally Skin: no lesions on visible extremities Neuro: No focal deficits. Cranial nerves intact.   ASSESSMENT:  #1. History of Crohn's ileocolitis status post remote ileocecectomy. He continues to be asymptomatic and remains in active surveillance off medication #2. Bile salt induced diarrhea managed with Colestid #3. B12 deficiency secondary to ileal resection. Receiving replacement therapy  PLAN:  #1. Refill Colestid #2. Continue B12 replacement injection therapy #3. Blood work  with PCP later this fall #4. Surveillance colonoscopy around August 2017. We discussed this. He is aware #5. Routine office follow-up annually. Sooner if needed for clinical issues

## 2015-04-05 ENCOUNTER — Ambulatory Visit: Payer: Medicare Other

## 2015-04-05 ENCOUNTER — Other Ambulatory Visit: Payer: Self-pay | Admitting: *Deleted

## 2015-04-05 MED ORDER — CARVEDILOL 3.125 MG PO TABS: ORAL_TABLET | ORAL | Status: DC

## 2015-04-21 ENCOUNTER — Ambulatory Visit: Payer: Medicare Other

## 2015-04-27 ENCOUNTER — Ambulatory Visit (INDEPENDENT_AMBULATORY_CARE_PROVIDER_SITE_OTHER): Payer: Medicare Other

## 2015-04-27 DIAGNOSIS — Z23 Encounter for immunization: Secondary | ICD-10-CM | POA: Diagnosis not present

## 2015-04-27 DIAGNOSIS — E538 Deficiency of other specified B group vitamins: Secondary | ICD-10-CM

## 2015-04-27 MED ORDER — CYANOCOBALAMIN 1000 MCG/ML IJ SOLN
1000.0000 ug | Freq: Once | INTRAMUSCULAR | Status: AC
  Administered 2015-04-27: 1000 ug via INTRAMUSCULAR

## 2015-06-01 ENCOUNTER — Ambulatory Visit (INDEPENDENT_AMBULATORY_CARE_PROVIDER_SITE_OTHER): Payer: Medicare Other

## 2015-06-01 DIAGNOSIS — E538 Deficiency of other specified B group vitamins: Secondary | ICD-10-CM | POA: Diagnosis not present

## 2015-06-01 MED ORDER — CYANOCOBALAMIN 1000 MCG/ML IJ SOLN
1000.0000 ug | Freq: Once | INTRAMUSCULAR | Status: AC
  Administered 2015-06-01: 1000 ug via INTRAMUSCULAR

## 2015-06-08 ENCOUNTER — Ambulatory Visit (INDEPENDENT_AMBULATORY_CARE_PROVIDER_SITE_OTHER): Payer: Medicare Other | Admitting: Family Medicine

## 2015-06-08 ENCOUNTER — Encounter: Payer: Self-pay | Admitting: Family Medicine

## 2015-06-08 VITALS — BP 140/78 | HR 66 | Temp 98.3°F | Wt 184.5 lb

## 2015-06-08 DIAGNOSIS — J069 Acute upper respiratory infection, unspecified: Secondary | ICD-10-CM

## 2015-06-08 DIAGNOSIS — H6980 Other specified disorders of Eustachian tube, unspecified ear: Secondary | ICD-10-CM | POA: Insufficient documentation

## 2015-06-08 DIAGNOSIS — H6981 Other specified disorders of Eustachian tube, right ear: Secondary | ICD-10-CM | POA: Diagnosis not present

## 2015-06-08 NOTE — Progress Notes (Signed)
Pre visit review using our clinic review tool, if applicable. No additional management support is needed unless otherwise documented below in the visit note. 

## 2015-06-08 NOTE — Progress Notes (Signed)
Subjective:    Patient ID: Paul Atkinson, male    DOB: 21-Nov-1950, 64 y.o.   MRN: [id number]  HPI Here with some uri symptoms   Has a R sided ear ache (hx of hearing loss and tinnitus so this worries him)   Nasal- stuffy ST - up high (not severe)  Some cough - using mucinex (helpful)  No fever  No chills or aches  Not tired   Patient Active Problem List   Diagnosis Date Noted  . Healthcare maintenance 02/17/2013  . Psoriasis 02/17/2013  . Dyslipidemia 10/04/2009  . Bile salt-induced diarrhea 12/17/2007  . Crohn's ileocolitis s/p remote ileostomy 11/10/2007  . HYPERTENSION, BENIGN ESSENTIAL 05/19/2007  . ANEMIA, VITAMIN B12 DEFICIENCY NEC 05/25/2000   Past Medical History  Diagnosis Date  . Crohn disease ([place])     status post ileocecectomy 1989  . Vitamin B 12 deficiency     secondary to ileal resection  . Bile salt-induced diarrhea   . Hypertension   . Anemia     iron def  . Malabsorption   . Disorder of inner ear   . Psoriasis   . Hearing loss   . Hx SBO   . Hx of duodenal ulcer    Past Surgical History  Procedure Laterality Date  . Ileocecetomy  1989    peritonitis with SBO from crohn's  . Septoplasty  05/2004    2nd to dev. septum (Dr. Myna Hidalgo)  . Mouth surgery    . Mole removal      back   Social History  Substance Use Topics  . Smoking status: Never Smoker   . Smokeless tobacco: Never Used  . Alcohol Use: No     Comment: very rarely   Family History  Problem Relation Age of Onset  . Heart disease Mother     CHF, arrhythmias  . Kidney disease Mother     kidney failure  . Hypertension Mother   . Kidney disease Father     renal failure  . CAD Father 98    MI stents  . Diabetes Father 80    on insulin  . Hyperlipidemia Father   . Cancer Neg Hx   . Colon cancer Neg Hx    Allergies  Allergen Reactions  . Penicillins     REACTION: RASH in throat   Current Outpatient Prescriptions on File Prior to Visit  Medication Sig Dispense  Refill  . aspirin 81 MG tablet Take 81 mg by mouth daily.    . carvedilol (COREG) 3.125 MG tablet TAKE 1 TABLET BY MOUTH TWICE A DAY WITH A MEAL **MUST HAVE PHYSICAL FOR FURTHER REFILLS** 180 tablet 0  . colestipol (COLESTID) 1 G tablet TAKE 4 TABLETS (4 G TOTAL) BY MOUTH DAILY. 120 tablet 6  . cyanocobalamin (,VITAMIN B-12,) 1000 MCG/ML injection Inject 1,000 mcg into the muscle every 30 (thirty) days. Chronic therapy    . fluticasone (FLONASE) 50 MCG/ACT nasal spray Place 1 spray into both nostrils daily.    . Multiple Vitamin (MULTIVITAMIN) tablet Take 1 tablet by mouth daily.       No current facility-administered medications on file prior to visit.    Review of Systems Review of Systems  Constitutional: Negative for fever, appetite change, fatigue and unexpected weight change.  ENT pos for cong and rhinorrhea and st ,neg for sinus pain , pos for ear pain and fullness  Eyes: Negative for pain and visual disturbance.  Respiratory: Negative for wheeze  and shortness  of breath.   Cardiovascular: Negative for cp or palpitations    Gastrointestinal: Negative for nausea, diarrhea and constipation.  Genitourinary: Negative for urgency and frequency.  Skin: Negative for pallor or rash   Neurological: Negative for weakness, light-headedness, numbness and headaches.  Hematological: Negative for adenopathy. Does not bruise/bleed easily.  Psychiatric/Behavioral: Negative for dysphoric mood. The patient is not nervous/anxious.         Objective:   Physical Exam  Constitutional: He appears well-developed and well-nourished. No distress.  Well appearing   HENT:  Head: Normocephalic and atraumatic.  Mouth/Throat: Oropharynx is clear and moist.  Nares are injected and congested  No sinus tenderness Clear rhinorrhea and post nasal drip  TMs are dull bilat with small eff on R (no erythema or bulging)  Eyes: Conjunctivae and EOM are normal. Pupils are equal, round, and reactive to light. Right  eye exhibits no discharge. Left eye exhibits no discharge.  Neck: Normal range of motion. Neck supple.  Cardiovascular: Normal rate and normal heart sounds.   Pulmonary/Chest: Effort normal and breath sounds normal. No respiratory distress. He has no wheezes. He has no rales. He exhibits no tenderness.  Lymphadenopathy:    He has no cervical adenopathy.  Neurological: He is alert.  Skin: Skin is warm and dry. No rash noted.  Psychiatric: He has a normal mood and affect.          Assessment & Plan:   Problem List Items Addressed This Visit      Respiratory   Viral URI - Primary    Reassuring exam  Recommend flonase for ETD and watch  Disc symptomatic care - see instructions on AVS  Update if not starting to improve in a week or if worsening          Nervous and Auditory   ETD (eustachian tube dysfunction)    Enc to start back on flonase once daily for at least 2 weeks Decongestant/ mucinex prn  Steam/ saline nasal Update if not starting to improve in a week or if worsening  -esp if worse ear pain

## 2015-06-08 NOTE — Assessment & Plan Note (Signed)
Reassuring exam  Recommend flonase for ETD and watch  Disc symptomatic care - see instructions on AVS  Update if not starting to improve in a week or if worsening

## 2015-06-08 NOTE — Patient Instructions (Signed)
I think you have a viral upper resp infection with eustacian tube dysfunction  Use your flonase 2 sprays in each nostril once daily for 2 weeks Nasal saline and steam help also  You can try sudafed (or D product) for congestion can help (watch the bp)  Update if not starting to improve in a week or if worsening  - especially if ear pain or fever

## 2015-06-08 NOTE — Assessment & Plan Note (Signed)
Enc to start back on flonase once daily for at least 2 weeks Decongestant/ mucinex prn  Steam/ saline nasal Update if not starting to improve in a week or if worsening  -esp if worse ear pain

## 2015-09-04 ENCOUNTER — Other Ambulatory Visit: Payer: Self-pay | Admitting: Family Medicine

## 2015-09-16 ENCOUNTER — Other Ambulatory Visit: Payer: Self-pay | Admitting: *Deleted

## 2015-09-16 MED ORDER — CARVEDILOL 3.125 MG PO TABS: 3.1250 mg | ORAL_TABLET | Freq: Two times a day (BID) | ORAL | Status: DC

## 2015-09-26 ENCOUNTER — Other Ambulatory Visit: Payer: Self-pay | Admitting: Family Medicine

## 2015-09-26 DIAGNOSIS — Z125 Encounter for screening for malignant neoplasm of prostate: Secondary | ICD-10-CM

## 2015-09-26 DIAGNOSIS — D518 Other vitamin B12 deficiency anemias: Secondary | ICD-10-CM

## 2015-09-26 DIAGNOSIS — I10 Essential (primary) hypertension: Secondary | ICD-10-CM

## 2015-09-26 DIAGNOSIS — E785 Hyperlipidemia, unspecified: Secondary | ICD-10-CM

## 2015-09-26 DIAGNOSIS — Z1159 Encounter for screening for other viral diseases: Secondary | ICD-10-CM

## 2015-09-27 ENCOUNTER — Other Ambulatory Visit (INDEPENDENT_AMBULATORY_CARE_PROVIDER_SITE_OTHER): Payer: Medicare Other

## 2015-09-27 ENCOUNTER — Other Ambulatory Visit: Payer: Self-pay | Admitting: Family Medicine

## 2015-09-27 DIAGNOSIS — D518 Other vitamin B12 deficiency anemias: Secondary | ICD-10-CM | POA: Diagnosis not present

## 2015-09-27 DIAGNOSIS — Z125 Encounter for screening for malignant neoplasm of prostate: Secondary | ICD-10-CM

## 2015-09-27 DIAGNOSIS — Z1159 Encounter for screening for other viral diseases: Secondary | ICD-10-CM

## 2015-09-27 DIAGNOSIS — E785 Hyperlipidemia, unspecified: Secondary | ICD-10-CM

## 2015-09-27 DIAGNOSIS — I10 Essential (primary) hypertension: Secondary | ICD-10-CM | POA: Diagnosis not present

## 2015-09-27 LAB — LDL CHOLESTEROL, DIRECT: Direct LDL: 68 mg/dL

## 2015-09-27 LAB — BASIC METABOLIC PANEL
BUN: 16 mg/dL (ref 6–23)
CALCIUM: 9.4 mg/dL (ref 8.4–10.5)
CO2: 28 mEq/L (ref 19–32)
CREATININE: 1.01 mg/dL (ref 0.40–1.50)
Chloride: 104 mEq/L (ref 96–112)
GFR: 78.61 mL/min (ref >60.00)
Glucose, Bld: 119 mg/dL — ABNORMAL HIGH (ref 70–99)
POTASSIUM: 4.3 meq/L (ref 3.5–5.1)
Sodium: 139 mEq/L (ref 135–145)

## 2015-09-27 LAB — LIPID PANEL
CHOL/HDL RATIO: 3
CHOLESTEROL: 142 mg/dL (ref 0–200)
HDL: 46 mg/dL (ref >39.00)
NONHDL: 95.82
TRIGLYCERIDES: 204 mg/dL — AB (ref 0.0–149.0)
VLDL: 40.8 mg/dL — ABNORMAL HIGH (ref 0.0–40.0)

## 2015-09-27 LAB — VITAMIN B12: Vitamin B-12: 252 pg/mL (ref 211–911)

## 2015-09-27 LAB — PSA, MEDICARE: PSA: 1.06 ng/mL (ref 0.10–4.00)

## 2015-09-28 LAB — HEPATITIS C ANTIBODY: HCV Ab: NEGATIVE

## 2015-10-04 ENCOUNTER — Encounter: Payer: Self-pay | Admitting: Family Medicine

## 2015-10-04 ENCOUNTER — Ambulatory Visit (INDEPENDENT_AMBULATORY_CARE_PROVIDER_SITE_OTHER): Payer: Medicare Other | Admitting: Family Medicine

## 2015-10-04 VITALS — BP 122/70 | HR 67 | Temp 98.0°F | Ht 71.0 in | Wt 186.0 lb

## 2015-10-04 DIAGNOSIS — Z23 Encounter for immunization: Secondary | ICD-10-CM | POA: Diagnosis not present

## 2015-10-04 DIAGNOSIS — Z0000 Encounter for general adult medical examination without abnormal findings: Secondary | ICD-10-CM | POA: Diagnosis not present

## 2015-10-04 DIAGNOSIS — E538 Deficiency of other specified B group vitamins: Secondary | ICD-10-CM

## 2015-10-04 DIAGNOSIS — K909 Intestinal malabsorption, unspecified: Secondary | ICD-10-CM

## 2015-10-04 DIAGNOSIS — E041 Nontoxic single thyroid nodule: Secondary | ICD-10-CM

## 2015-10-04 DIAGNOSIS — D518 Other vitamin B12 deficiency anemias: Secondary | ICD-10-CM

## 2015-10-04 DIAGNOSIS — Z7189 Other specified counseling: Secondary | ICD-10-CM | POA: Diagnosis not present

## 2015-10-04 DIAGNOSIS — K5080 Crohn's disease of both small and large intestine without complications: Secondary | ICD-10-CM

## 2015-10-04 DIAGNOSIS — K9089 Other intestinal malabsorption: Secondary | ICD-10-CM

## 2015-10-04 DIAGNOSIS — E785 Hyperlipidemia, unspecified: Secondary | ICD-10-CM

## 2015-10-04 DIAGNOSIS — I10 Essential (primary) hypertension: Secondary | ICD-10-CM

## 2015-10-04 MED ORDER — CARVEDILOL 3.125 MG PO TABS: 3.1250 mg | ORAL_TABLET | Freq: Two times a day (BID) | ORAL | Status: DC

## 2015-10-04 MED ORDER — COLESTIPOL HCL 1 G PO TABS: ORAL_TABLET | ORAL | Status: DC

## 2015-10-04 MED ORDER — CYANOCOBALAMIN 1000 MCG/ML IJ SOLN
1000.0000 ug | Freq: Once | INTRAMUSCULAR | Status: AC
  Administered 2015-10-04: 1000 ug via INTRAMUSCULAR

## 2015-10-04 NOTE — Assessment & Plan Note (Signed)
Continue monthly B12 shots - encouraged better compliance.

## 2015-10-04 NOTE — Addendum Note (Signed)
Addended by: Josetta Huddle on: 10/04/2015 11:52 AM   Modules accepted: Orders

## 2015-10-04 NOTE — Assessment & Plan Note (Signed)
With enlargement - check thyroid US. Pt agrees

## 2015-10-04 NOTE — Addendum Note (Signed)
Addended by: Josetta Huddle on: 10/04/2015 11:31 AM   Modules accepted: Orders

## 2015-10-04 NOTE — Addendum Note (Signed)
Addended by: Josetta Huddle on: 10/04/2015 11:32 AM   Modules accepted: Orders

## 2015-10-04 NOTE — Assessment & Plan Note (Signed)
Stable period. Followed closely by GI. Upcoming appt later this year. Seems he will be due for colonoscopy this year.

## 2015-10-04 NOTE — Assessment & Plan Note (Signed)
Chronic, stable. Continue coreg.

## 2015-10-04 NOTE — Progress Notes (Signed)
Pre visit review using our clinic review tool, if applicable. No additional management support is needed unless otherwise documented below in the visit note. 

## 2015-10-04 NOTE — Assessment & Plan Note (Signed)
Reviewed healthy diet changes to improve triglycerides

## 2015-10-04 NOTE — Assessment & Plan Note (Addendum)
I have personally reviewed the Medicare Annual Wellness questionnaire and have noted 1. The patient's medical and social history 2. Their use of alcohol, tobacco or illicit drugs 3. Their current medications and supplements 4. The patient's functional ability including ADL's, fall risks, home safety risks and hearing or visual impairment. Cognitive function has been assessed and addressed as indicated.  5. Diet and physical activity 6. Evidence for depression or mood disorders The patients weight, height, BMI have been recorded in the chart. I have made referrals, counseling and provided education to the patient based on review of the above and I have provided the pt with a written personalized care plan for preventive services. Provider list updated.. See scanned questionairre as needed for further documentation. Reviewed preventative protocols and updated unless pt declined.   EKG NSR rate 60s, normal axis, intervals, no acute ST/T changes

## 2015-10-04 NOTE — Assessment & Plan Note (Signed)
Continue colestipol.

## 2015-10-04 NOTE — Progress Notes (Addendum)
BP 122/70 mmHg  Pulse 67  Temp(Src) 98 F (36.7 C) (Oral)  Ht 5' 11"  (1.803 m)  Wt 186 lb (84.369 kg)  BMI 25.95 kg/m2  SpO2 97%   CC: welcome to medicare visit  Subjective:    Patient ID: Paul Atkinson, male    DOB: 04-Oct-1950, 65 y.o.   MRN: [id number]  HPI: Paul Atkinson is a 65 y.o. male presenting on 10/04/2015 for Annual Exam   H/o crohn's ileocolitis s/p ilocecectomy along with bile salt -induced diarrhea on colestipol. Receives monthly B12 shots at our office. Has not been regular with this.   Hearing - passed left, some missed frequencies on left. Declines audiologist eval at this time. Has seen in the past. Vision - passed. Encouraged schedule eye exam as overdue (10+ yrs) Falls - passed Depression - passed  Preventative: Colonoscopy - 01/2011, stable.Rec rpt 5 years (Dr. Henrene Pastor) Prostate cancer screening - minor sxs, attributed to enlarged prostate. Nocturia x1. Previously agreed to stop screening for now. Flu shot yearly prevnar - today. Td 2012 zostavax 2012 Advanced directives: has at home. HCPOA is wife. Will bring copy. Discussed seat belt use. Discussed sunscreen use. No changing moles on skin. Sees derm yearly.   Occasional caffeine  Lives with wife, no pets  Occupation: retired, Medical illustrator, Actuary  Activity: no regular exercise but stays active  Diet: good water, fruits/vegetables some   Relevant past medical, surgical, family and social history reviewed and updated as indicated. Interim medical history since our last visit reviewed. Allergies and medications reviewed and updated. Current Outpatient Prescriptions on File Prior to Visit  Medication Sig  . aspirin 81 MG tablet Take 81 mg by mouth daily.  . cyanocobalamin (,VITAMIN B-12,) 1000 MCG/ML injection Inject 1,000 mcg into the muscle every 30 (thirty) days. Chronic therapy  . fluticasone (FLONASE) 50 MCG/ACT nasal spray Place 1 spray into both nostrils daily as needed.     No current facility-administered medications on file prior to visit.    Review of Systems Per HPI unless specifically indicated in ROS section     Objective:    BP 122/70 mmHg  Pulse 67  Temp(Src) 98 F (36.7 C) (Oral)  Ht 5' 11"  (1.803 m)  Wt 186 lb (84.369 kg)  BMI 25.95 kg/m2  SpO2 97%  Wt Readings from Last 3 Encounters:  10/04/15 186 lb (84.369 kg)  06/08/15 184 lb 8 oz (83.689 kg)  03/31/15 181 lb 9.6 oz (82.373 kg)    Physical Exam  Constitutional: He is oriented to person, place, and time. He appears well-developed and well-nourished. No distress.  HENT:  Head: Normocephalic and atraumatic.  Right Ear: Hearing, tympanic membrane, external ear and ear canal normal.  Left Ear: Hearing, tympanic membrane, external ear and ear canal normal.  Nose: Nose normal.  Mouth/Throat: Uvula is midline, oropharynx is clear and moist and mucous membranes are normal. No oropharyngeal exudate, posterior oropharyngeal edema or posterior oropharyngeal erythema.  Eyes: Conjunctivae and EOM are normal. Pupils are equal, round, and reactive to light. No scleral icterus.  Neck: Normal range of motion. Neck supple. Carotid bruit is not present. Thyromegaly (R enlargement ?nodule) present.  Cardiovascular: Normal rate, regular rhythm, normal heart sounds and intact distal pulses.   No murmur heard. Pulses:      Radial pulses are 2+ on the right side, and 2+ on the left side.  Pulmonary/Chest: Effort normal and breath sounds normal. No respiratory distress. He has no wheezes. He has  no rales.  Abdominal: Soft. Bowel sounds are normal. He exhibits no distension and no mass. There is no tenderness. There is no rebound and no guarding.  Genitourinary:  DRE - declined  Musculoskeletal: Normal range of motion. He exhibits no edema.  Lymphadenopathy:    He has no cervical adenopathy.  Neurological: He is alert and oriented to person, place, and time.  CN grossly intact, station and gait  intact Recall 3/3 Calculation 5/5 serial 3s  Skin: Skin is warm and dry. No rash noted.  Psychiatric: He has a normal mood and affect. His behavior is normal. Judgment and thought content normal.  Nursing note and vitals reviewed.  Results for orders placed or performed in visit on 09/27/15  Lipid panel  Result Value Ref Range   Cholesterol 142 0 - 200 mg/dL   Triglycerides 204.0 (H) 0.0 - 149.0 mg/dL   HDL 46.00 >39.00 mg/dL   VLDL 40.8 (H) 0.0 - 40.0 mg/dL   Total CHOL/HDL Ratio 3    NonHDL 95.82   Vitamin B12  Result Value Ref Range   Vitamin B-12 252 211 - 911 pg/mL  Basic metabolic panel  Result Value Ref Range   Sodium 139 135 - 145 mEq/L   Potassium 4.3 3.5 - 5.1 mEq/L   Chloride 104 96 - 112 mEq/L   CO2 28 19 - 32 mEq/L   Glucose, Bld 119 (H) 70 - 99 mg/dL   BUN 16 6 - 23 mg/dL   Creatinine, Ser 1.01 0.40 - 1.50 mg/dL   Calcium 9.4 8.4 - 10.5 mg/dL   GFR 78.61 >60.00 mL/min  PSA, Medicare  Result Value Ref Range   PSA 1.06 0.10 - 4.00 ng/ml  LDL cholesterol, direct  Result Value Ref Range   Direct LDL 68.0 mg/dL   Lab Results  Component Value Date   WBC 5.1 05/07/2014   HGB 13.9 05/07/2014   HCT 42.0 05/07/2014   MCV 92.6 05/07/2014   PLT 209.0 05/07/2014       Assessment & Plan:   Problem List Items Addressed This Visit    Dyslipidemia    Reviewed healthy diet changes to improve triglycerides      Relevant Medications   colestipol (COLESTID) 1 g tablet   ANEMIA, VITAMIN B12 DEFICIENCY NEC    Continue monthly B12 shots - encouraged better compliance.       Relevant Medications   cyanocobalamin ((VITAMIN B-12)) injection 1,000 mcg (Completed)   HYPERTENSION, BENIGN ESSENTIAL    Chronic, stable. Continue coreg.      Relevant Medications   carvedilol (COREG) 3.125 MG tablet   colestipol (COLESTID) 1 g tablet   Crohn's ileocolitis s/p remote ileostomy    Stable period. Followed closely by GI. Upcoming appt later this year. Seems he will be due  for colonoscopy this year.      Bile salt-induced diarrhea    Continue colestipol.      Welcome to Medicare preventive visit - Primary    I have personally reviewed the Medicare Annual Wellness questionnaire and have noted 1. The patient's medical and social history 2. Their use of alcohol, tobacco or illicit drugs 3. Their current medications and supplements 4. The patient's functional ability including ADL's, fall risks, home safety risks and hearing or visual impairment. Cognitive function has been assessed and addressed as indicated.  5. Diet and physical activity 6. Evidence for depression or mood disorders The patients weight, height, BMI have been recorded in the chart. I have made referrals,  counseling and provided education to the patient based on review of the above and I have provided the pt with a written personalized care plan for preventive services. Provider list updated.. See scanned questionairre as needed for further documentation. Reviewed preventative protocols and updated unless pt declined.   EKG NSR rate 60s, normal axis, intervals, no acute ST/T changes      Relevant Orders   EKG 12-Lead (Completed)   Advanced care planning/counseling discussion    Advanced directives: has at home. HCPOA is wife. Will bring copy.      RESOLVED: Right thyroid nodule    With enlargement - check thyroid US. Pt agrees      Relevant Medications   carvedilol (COREG) 3.125 MG tablet   Other Relevant Orders   US THYROID (Completed)    Other Visit Diagnoses    B12 deficiency        Relevant Medications    cyanocobalamin ((VITAMIN B-12)) injection 1,000 mcg (Completed)    Need for vaccination with 13-polyvalent pneumococcal conjugate vaccine        Relevant Orders    Pneumococcal conjugate vaccine 13-valent (Completed)        Follow up plan: Return in about 1 year (around 10/03/2016), or as needed, for medicare wellness visit.  Ria Bush, MD

## 2015-10-04 NOTE — Assessment & Plan Note (Signed)
Advanced directives: has at home. HCPOA is wife. Will bring copy.

## 2015-10-04 NOTE — Patient Instructions (Addendum)
EKG today. prevnar today. B12 shot today. Bring [place] copy of living will to update your chart.  Pass by [facility]'s office to schedule thyroid ultrasound.  You are doing well today. Return as needed or in 1 year for next medicare wellness visit.  Health Maintenance, Male A healthy lifestyle and preventative care can promote health and wellness.  Maintain regular health, dental, and eye exams.  Eat a healthy diet. Foods like vegetables, fruits, whole grains, low-fat dairy products, and lean protein foods contain the nutrients you need and are low in calories. Decrease your intake of foods high in solid fats, added sugars, and salt. Get information about a proper diet from your health care provider, if necessary.  Regular physical exercise is one of the most important things you can do for your health. Most adults should get at least 150 minutes of moderate-intensity exercise (any activity that increases your heart rate and causes you to sweat) each week. In addition, most adults need muscle-strengthening exercises on 2 or more days a week.   Maintain a healthy weight. The body mass index (BMI) is a screening tool to identify possible weight problems. It provides an estimate of body fat based on height and weight. Your health care provider can find your BMI and can help you achieve or maintain a healthy weight. For males 20 years and older:  A BMI below 18.5 is considered underweight.  A BMI of 18.5 to 24.9 is normal.  A BMI of 25 to 29.9 is considered overweight.  A BMI of 30 and above is considered obese.  Maintain normal blood lipids and cholesterol by exercising and minimizing your intake of saturated fat. Eat a balanced diet with plenty of fruits and vegetables. Blood tests for lipids and cholesterol should begin at age 84 and be repeated every 5 years. If your lipid or cholesterol levels are high, you are over age 32, or you are at high risk for heart disease, you may need your cholesterol  levels checked more frequently.Ongoing high lipid and cholesterol levels should be treated with medicines if diet and exercise are not working.  If you smoke, find out from your health care provider how to quit. If you do not use tobacco, do not start.  Lung cancer screening is recommended for adults aged 35-80 years who are at high risk for developing lung cancer because of a history of smoking. A yearly low-dose CT scan of the lungs is recommended for people who have at least a 30-pack-year history of smoking and are current smokers or have quit within the past 15 years. A pack year of smoking is smoking an average of 1 pack of cigarettes a day for 1 year (for example, a 30-pack-year history of smoking could mean smoking 1 pack a day for 30 years or 2 packs a day for 15 years). Yearly screening should continue until the smoker has stopped smoking for at least 15 years. Yearly screening should be stopped for people who develop a health problem that would prevent them from having lung cancer treatment.  If you choose to drink alcohol, do not have more than 2 drinks per day. One drink is considered to be 12 oz (360 mL) of beer, 5 oz (150 mL) of wine, or 1.5 oz (45 mL) of liquor.  Avoid the use of street drugs. Do not share needles with anyone. Ask for help if you need support or instructions about stopping the use of drugs.  High blood pressure causes heart disease  and increases the risk of stroke. High blood pressure is more likely to develop in:  People who have blood pressure in the end of the normal range (100-139/85-89 mm Hg).  People who are overweight or obese.  People who are African American.  If you are 27-30 years of age, have your blood pressure checked every 3-5 years. If you are 6 years of age or older, have your blood pressure checked every year. You should have your blood pressure measured twice--once when you are at a hospital or clinic, and once when you are not at a hospital or  clinic. Record the average of the two measurements. To check your blood pressure when you are not at a hospital or clinic, you can use:  An automated blood pressure machine at a pharmacy.  A home blood pressure monitor.  If you are 49-51 years old, ask your health care provider if you should take aspirin to prevent heart disease.  Diabetes screening involves taking a blood sample to check your fasting blood sugar level. This should be done once every 3 years after age 79 if you are at a normal weight and without risk factors for diabetes. Testing should be considered at a younger age or be carried out more frequently if you are overweight and have at least 1 risk factor for diabetes.  Colorectal cancer can be detected and often prevented. Most routine colorectal cancer screening begins at the age of 36 and continues through age 102. However, your health care provider may recommend screening at an earlier age if you have risk factors for colon cancer. On a yearly basis, your health care provider may provide home test kits to check for hidden blood in the stool. A small camera at the end of a tube may be used to directly examine the colon (sigmoidoscopy or colonoscopy) to detect the earliest forms of colorectal cancer. Talk to your health care provider about this at age 85 when routine screening begins. A direct exam of the colon should be repeated every 5-10 years through age 60, unless early forms of precancerous polyps or small growths are found.  People who are at an increased risk for hepatitis B should be screened for this virus. You are considered at high risk for hepatitis B if:  You were born in a country where hepatitis B occurs often. Talk with your health care provider about which countries are considered high risk.  Your parents were born in a high-risk country and you have not received a shot to protect against hepatitis B (hepatitis B vaccine).  You have HIV or AIDS.  You use needles  to inject street drugs.  You live with, or have sex with, someone who has hepatitis B.  You are a man who has sex with other men (MSM).  You get hemodialysis treatment.  You take certain medicines for conditions like cancer, organ transplantation, and autoimmune conditions.  Hepatitis C blood testing is recommended for all people born from [birth year] through 1966 and any individual with known risk factors for hepatitis C.  Healthy men should no longer receive prostate-specific antigen (PSA) blood tests as part of routine cancer screening. Talk to your health care provider about prostate cancer screening.  Testicular cancer screening is not recommended for adolescents or adult males who have no symptoms. Screening includes self-exam, a health care provider exam, and other screening tests. Consult with your health care provider about any symptoms you have or any concerns you have about testicular cancer.  Practice safe sex. Use condoms and avoid high-risk sexual practices to reduce the spread of sexually transmitted infections (STIs).  You should be screened for STIs, including gonorrhea and chlamydia if:  You are sexually active and are younger than 24 years.  You are older than 24 years, and your health care provider tells you that you are at risk for this type of infection.  Your sexual activity has changed since you were last screened, and you are at an increased risk for chlamydia or gonorrhea. Ask your health care provider if you are at risk.  If you are at risk of being infected with HIV, it is recommended that you take a prescription medicine daily to prevent HIV infection. This is called pre-exposure prophylaxis (PrEP). You are considered at risk if:  You are a man who has sex with other men (MSM).  You are a heterosexual man who is sexually active with multiple partners.  You take drugs by injection.  You are sexually active with a partner who has HIV.  Talk with your health  care provider about whether you are at high risk of being infected with HIV. If you choose to begin PrEP, you should first be tested for HIV. You should then be tested every 3 months for as long as you are taking PrEP.  Use sunscreen. Apply sunscreen liberally and repeatedly throughout the day. You should seek shade when your shadow is shorter than you. Protect yourself by wearing long sleeves, pants, a wide-brimmed hat, and sunglasses year round whenever you are outdoors.  Tell your health care provider of new moles or changes in moles, especially if there is a change in shape or color. Also, tell your health care provider if a mole is larger than the size of a pencil eraser.  A one-time screening for abdominal aortic aneurysm (AAA) and surgical repair of large AAAs by ultrasound is recommended for men aged 35-75 years who are current or former smokers.  Stay current with your vaccines (immunizations).   This information is not intended to replace advice given to you by your health care provider. Make sure you discuss any questions you have with your health care provider.   Document Released: 12/08/2007 Document Revised: 07/02/2014 Document Reviewed: 11/06/2010 Elsevier Interactive Patient Education [employer].

## 2015-10-07 ENCOUNTER — Ambulatory Visit
Admission: RE | Admit: 2015-10-07 | Discharge: 2015-10-07 | Disposition: A | Discharge status: Home / Self Care | Payer: Medicare Other | Source: Ambulatory Visit | Attending: Family Medicine | Admitting: Family Medicine

## 2015-10-07 DIAGNOSIS — E041 Nontoxic single thyroid nodule: Secondary | ICD-10-CM | POA: Diagnosis not present

## 2015-10-08 NOTE — Addendum Note (Signed)
Addended by: Ria Bush on: 10/08/2015 10:59 AM   Modules accepted: Miquel Dunn

## 2015-10-08 NOTE — Assessment & Plan Note (Signed)
S/p normal thyroid US

## 2015-10-12 ENCOUNTER — Telehealth: Payer: Self-pay | Admitting: Family Medicine

## 2015-10-12 NOTE — Telephone Encounter (Signed)
Patient notified that EKG was normal and that he should be receiving a letter soon.

## 2015-10-12 NOTE — Telephone Encounter (Signed)
Pt would like to know results of [place] and ekg from last visit. cb number is [phone] Thank you

## 2015-11-16 ENCOUNTER — Ambulatory Visit (INDEPENDENT_AMBULATORY_CARE_PROVIDER_SITE_OTHER): Payer: Medicare Other | Admitting: *Deleted

## 2015-11-16 DIAGNOSIS — E538 Deficiency of other specified B group vitamins: Secondary | ICD-10-CM

## 2015-11-16 MED ORDER — CYANOCOBALAMIN 1000 MCG/ML IJ SOLN
1000.0000 ug | Freq: Once | INTRAMUSCULAR | Status: AC
  Administered 2015-11-16: 1000 ug via INTRAMUSCULAR

## 2015-12-12 DIAGNOSIS — S6992XA Unspecified injury of left wrist, hand and finger(s), initial encounter: Secondary | ICD-10-CM | POA: Diagnosis not present

## 2015-12-12 DIAGNOSIS — S61209A Unspecified open wound of unspecified finger without damage to nail, initial encounter: Secondary | ICD-10-CM | POA: Diagnosis not present

## 2015-12-20 ENCOUNTER — Ambulatory Visit: Payer: Medicare Other

## 2016-01-25 DIAGNOSIS — L859 Epidermal thickening, unspecified: Secondary | ICD-10-CM | POA: Diagnosis not present

## 2016-01-25 DIAGNOSIS — Z85828 Personal history of other malignant neoplasm of skin: Secondary | ICD-10-CM | POA: Diagnosis not present

## 2016-01-25 DIAGNOSIS — L821 Other seborrheic keratosis: Secondary | ICD-10-CM | POA: Diagnosis not present

## 2016-01-25 DIAGNOSIS — D225 Melanocytic nevi of trunk: Secondary | ICD-10-CM | POA: Diagnosis not present

## 2016-01-25 DIAGNOSIS — Z86018 Personal history of other benign neoplasm: Secondary | ICD-10-CM | POA: Diagnosis not present

## 2016-01-25 DIAGNOSIS — L409 Psoriasis, unspecified: Secondary | ICD-10-CM | POA: Diagnosis not present

## 2016-02-14 ENCOUNTER — Ambulatory Visit (INDEPENDENT_AMBULATORY_CARE_PROVIDER_SITE_OTHER): Payer: Medicare Other | Admitting: Family Medicine

## 2016-02-14 DIAGNOSIS — E538 Deficiency of other specified B group vitamins: Secondary | ICD-10-CM

## 2016-02-14 MED ORDER — CYANOCOBALAMIN 1000 MCG/ML IJ SOLN
1000.0000 ug | Freq: Once | INTRAMUSCULAR | Status: AC
  Administered 2016-02-14: 1000 ug via INTRAMUSCULAR

## 2016-02-14 NOTE — Progress Notes (Signed)
B12 shot today. 

## 2016-03-20 ENCOUNTER — Ambulatory Visit (INDEPENDENT_AMBULATORY_CARE_PROVIDER_SITE_OTHER): Payer: Medicare Other

## 2016-03-20 DIAGNOSIS — E538 Deficiency of other specified B group vitamins: Secondary | ICD-10-CM

## 2016-03-20 MED ORDER — CYANOCOBALAMIN 1000 MCG/ML IJ SOLN
1000.0000 ug | Freq: Once | INTRAMUSCULAR | Status: AC
  Administered 2016-03-20: 1000 ug via INTRAMUSCULAR

## 2016-04-24 ENCOUNTER — Ambulatory Visit (INDEPENDENT_AMBULATORY_CARE_PROVIDER_SITE_OTHER): Payer: Medicare Other

## 2016-04-24 DIAGNOSIS — E538 Deficiency of other specified B group vitamins: Secondary | ICD-10-CM

## 2016-04-24 DIAGNOSIS — Z23 Encounter for immunization: Secondary | ICD-10-CM

## 2016-04-24 MED ORDER — CYANOCOBALAMIN 1000 MCG/ML IJ SOLN
1000.0000 ug | Freq: Once | INTRAMUSCULAR | Status: AC
  Administered 2016-04-24: 1000 ug via INTRAMUSCULAR

## 2016-05-29 ENCOUNTER — Ambulatory Visit (INDEPENDENT_AMBULATORY_CARE_PROVIDER_SITE_OTHER): Payer: Medicare Other

## 2016-05-29 DIAGNOSIS — E538 Deficiency of other specified B group vitamins: Secondary | ICD-10-CM

## 2016-05-29 MED ORDER — CYANOCOBALAMIN 1000 MCG/ML IJ SOLN
1000.0000 ug | Freq: Once | INTRAMUSCULAR | Status: AC
  Administered 2016-05-29: 1000 ug via INTRAMUSCULAR

## 2016-06-29 ENCOUNTER — Ambulatory Visit (INDEPENDENT_AMBULATORY_CARE_PROVIDER_SITE_OTHER): Payer: Medicare Other | Admitting: Internal Medicine

## 2016-06-29 ENCOUNTER — Encounter: Payer: Self-pay | Admitting: Internal Medicine

## 2016-06-29 VITALS — BP 120/70 | HR 84 | Ht 71.5 in | Wt 181.2 lb

## 2016-06-29 DIAGNOSIS — K9089 Other intestinal malabsorption: Secondary | ICD-10-CM

## 2016-06-29 DIAGNOSIS — K5010 Crohn's disease of large intestine without complications: Secondary | ICD-10-CM | POA: Diagnosis not present

## 2016-06-29 DIAGNOSIS — E538 Deficiency of other specified B group vitamins: Secondary | ICD-10-CM

## 2016-06-29 MED ORDER — NA SULFATE-K SULFATE-MG SULF 17.5-3.13-1.6 GM/177ML PO SOLN
1.0000 | Freq: Once | ORAL | 0 refills | Status: AC
Start: 2016-06-29 — End: 2016-06-29

## 2016-06-29 NOTE — Patient Instructions (Signed)

## 2016-06-29 NOTE — Progress Notes (Signed)
HISTORY OF PRESENT ILLNESS:  Paul Atkinson is a 66 y.o. male with a history of Crohn's ileocolitis for which he is status post ileocecectomy at [facility] greater than 20 years ago. He also has a history of postoperative bile salt induced diarrhea and B12 deficiency for which he is on Colestid and B12 replacement respectively. He presents today for annual follow-up. Last evaluated 03/31/2015. See that dictation. Most part has been doing well. He does describe an episode of what sounds like transient partial small bowel obstruction several months ago. This lasted 3-4 hours. Reminding him of note problems with bowel obstruction. No problems since. He has one to 3 formed bowel movements daily without urgency. No bleeding. No fevers. No weight loss. He is overdue for surveillance colonoscopy. Last exam August 2012. The examination at that time revealed an isolated anastomotic ulcer. Otherwise normal colonic and ileal mucosa. He also inquires about the use of NSAIDs for back pain and plantar fasciitis. He is acutely aware that NSAIDs may exacerbate or aggravate inflammatory bowel disease. He uses them infrequently  REVIEW OF SYSTEMS:  All non-GI ROS negative except for sinus and allergy trouble, arthritis, hearing problems  Past Medical History:  Diagnosis Date  . Anemia    iron def  . Bile salt-induced diarrhea   . Crohn disease ([place])    status post ileocecectomy 1989  . Disorder of inner ear   . Hearing loss   . Hx of duodenal ulcer   . Hx SBO   . Hypertension   . Malabsorption   . Psoriasis   . Vitamin B 12 deficiency    secondary to ileal resection    Past Surgical History:  Procedure Laterality Date  . ILEOCECETOMY  1989   peritonitis with SBO from crohn's  . MOLE REMOVAL     back  . MOUTH SURGERY    . SEPTOPLASTY  05/2004   2nd to dev. septum (Dr. Myna Hidalgo)    Social History [name]  reports that he has never smoked. He has never used smokeless tobacco. He reports that he  does not drink alcohol or use drugs.  family history includes CAD (age of onset: 36) in his father; Diabetes (age of onset: 3) in his father; Heart disease in his mother; Hyperlipidemia in his father; Hypertension in his mother; Kidney disease in his father and mother.  Allergies  Allergen Reactions  . Penicillins     REACTION: RASH in throat       PHYSICAL EXAMINATION: Vital signs: BP 120/70 (BP Location: Left Arm, Patient Position: Sitting, Cuff Size: Normal)   Pulse 84   Ht 5' 11.5" (1.816 m)   Wt 181 lb 4 oz (82.2 kg)   BMI 24.93 kg/m   Constitutional: generally well-appearing, no acute distress Psychiatric: alert and oriented x3, cooperative Eyes: extraocular movements intact, anicteric, conjunctiva pink Mouth: oral pharynx moist, no lesions Neck: supple no lymphadenopathy Cardiovascular: heart regular rate and rhythm, no murmur Lungs: clear to auscultation bilaterally Abdomen: soft, nontender, nondistended, no obvious ascites, no peritoneal signs, normal bowel sounds, no organomegaly. Previous abdominal incisions well-healed Rectal:Deferred until colonoscopy Extremities: no clubbing cyanosis or lower extremity edema bilaterally Skin: no lesions on visible extremities Neuro: No focal deficits. Cranial nerves intact  ASSESSMENT:  #1. History of Crohn's ileocolitis status post remote ileocecectomy. Recent transient issue with abdominal discomfort suggestive of transient partial bowel obstruction #2. Bile salt induced diarrhea. Controlled with Colestid #3. B12 deficiency secondary to ileal resection. On replacement therapy  PLAN:  #  1. Continue Colestid. Refill #2. Continue B12 replacement #3. Schedule surveillance colonoscopy.The nature of the procedure, as well as the risks, benefits, and alternatives were carefully and thoroughly reviewed with the patient. Ample time for discussion and questions allowed. The patient understood, was satisfied, and agreed to proceed. #4.  Judicious use of NSAIDs if absolutely necessary

## 2016-07-03 ENCOUNTER — Encounter: Payer: Self-pay | Admitting: Internal Medicine

## 2016-07-03 ENCOUNTER — Ambulatory Visit: Payer: Medicare Other

## 2016-07-04 ENCOUNTER — Ambulatory Visit (INDEPENDENT_AMBULATORY_CARE_PROVIDER_SITE_OTHER): Payer: Medicare Other

## 2016-07-04 DIAGNOSIS — E538 Deficiency of other specified B group vitamins: Secondary | ICD-10-CM | POA: Diagnosis not present

## 2016-07-04 MED ORDER — CYANOCOBALAMIN 1000 MCG/ML IJ SOLN
1000.0000 ug | Freq: Once | INTRAMUSCULAR | Status: AC
  Administered 2016-07-04: 1000 ug via INTRAMUSCULAR

## 2016-07-09 ENCOUNTER — Encounter: Payer: Medicare Other | Admitting: Internal Medicine

## 2016-07-26 HISTORY — PX: COLONOSCOPY: SHX174

## 2016-08-08 ENCOUNTER — Ambulatory Visit (INDEPENDENT_AMBULATORY_CARE_PROVIDER_SITE_OTHER): Payer: Medicare Other

## 2016-08-08 DIAGNOSIS — E538 Deficiency of other specified B group vitamins: Secondary | ICD-10-CM

## 2016-08-08 MED ORDER — CYANOCOBALAMIN 1000 MCG/ML IJ SOLN
1000.0000 ug | Freq: Once | INTRAMUSCULAR | Status: AC
  Administered 2016-08-08: 1000 ug via INTRAMUSCULAR

## 2016-08-10 ENCOUNTER — Encounter: Payer: Self-pay | Admitting: Internal Medicine

## 2016-08-10 ENCOUNTER — Ambulatory Visit (AMBULATORY_SURGERY_CENTER): Payer: Medicare Other | Admitting: Internal Medicine

## 2016-08-10 VITALS — BP 113/73 | HR 57 | Temp 98.0°F | Resp 21 | Ht 71.5 in | Wt 181.0 lb

## 2016-08-10 DIAGNOSIS — K5010 Crohn's disease of large intestine without complications: Secondary | ICD-10-CM | POA: Diagnosis present

## 2016-08-10 DIAGNOSIS — K5090 Crohn's disease, unspecified, without complications: Secondary | ICD-10-CM | POA: Diagnosis not present

## 2016-08-10 MED ORDER — SODIUM CHLORIDE 0.9 % IV SOLN: 500.0000 mL | INTRAVENOUS | Status: DC

## 2016-08-10 NOTE — Patient Instructions (Signed)
YOU HAD AN ENDOSCOPIC PROCEDURE TODAY AT [facility]:   Refer to the procedure report that was given to you for any specific questions about what was found during the examination.  If the procedure report does not answer your questions, please call your gastroenterologist to clarify.  If you requested that your care partner not be given the details of your procedure findings, then the procedure report has been included in a sealed envelope for you to review at your convenience later.  YOU SHOULD EXPECT: Some feelings of bloating in the abdomen. Passage of more gas than usual.  Walking can help get rid of the air that was put into your GI tract during the procedure and reduce the bloating. If you had a lower endoscopy (such as a colonoscopy or flexible sigmoidoscopy) you may notice spotting of blood in your stool or on the toilet paper. If you underwent a bowel prep for your procedure, you may not have a normal bowel movement for a few days.  Please Note:  You might notice some irritation and congestion in your nose or some drainage.  This is from the oxygen used during your procedure.  There is no need for concern and it should clear up in a day or so.  SYMPTOMS TO REPORT IMMEDIATELY:   Following lower endoscopy (colonoscopy or flexible sigmoidoscopy):  Excessive amounts of blood in the stool  Significant tenderness or worsening of abdominal pains  Swelling of the abdomen that is new, acute  Fever of 100F or higher   For urgent or emergent issues, a gastroenterologist can be reached at any hour by calling [phone].   DIET:  We do recommend a small meal at first, but then you may proceed to your regular diet.  Drink plenty of fluids but you should avoid alcoholic beverages for 24 hours.  ACTIVITY:  You should plan to take it easy for the rest of today and you should NOT DRIVE or use heavy machinery until tomorrow (because of the sedation medicines used during the test).     FOLLOW UP: Our staff will call the number listed on your records the next business day following your procedure to check on you and address any questions or concerns that you may have regarding the information given to you following your procedure. If we do not reach you, we will leave a message.  However, if you are feeling well and you are not experiencing any problems, there is no need to return our call.  We will assume that you have returned to your regular daily activities without incident.  If any biopsies were taken you will be contacted by phone or by letter within the next 1-3 weeks.  Please call us at [phone] if you have not heard about the biopsies in 3 weeks.    Repeat Colonoscopy in 5 years for screening Routine office visit in 1 year office will contact you for appointment  SIGNATURES/CONFIDENTIALITY: You and/or your care partner have signed paperwork which will be entered into your electronic medical record.  These signatures attest to the fact that that the information above on your After Visit Summary has been reviewed and is understood.  Full responsibility of the confidentiality of this discharge information lies with you and/or your care-partner.

## 2016-08-10 NOTE — Op Note (Signed)
[place] Patient Name: Paul Atkinson Procedure Date: 08/10/2016 10:53 AM MRN: [id number] Endoscopist: Docia Chuck. Henrene Pastor , MD Age: 66 Referring MD:  Date of Birth: 1951/01/25 Gender: Male Account #: [id number] Procedure:                Colonoscopy Indications:              High risk colon cancer surveillance: Crohn's small                            and large intestine. Patient in active                            surveillance. Remote ileocecectomy. Has been in                            remission off medications since his surgery Medicines:                Monitored Anesthesia Care Procedure:                Pre-Anesthesia Assessment:                           - Prior to the procedure, a History and Physical                            was performed, and patient medications and                            allergies were reviewed. The patient's tolerance of                            previous anesthesia was also reviewed. The risks                            and benefits of the procedure and the sedation                            options and risks were discussed with the patient.                            All questions were answered, and informed consent                            was obtained. Prior Anticoagulants: The patient has                            taken no previous anticoagulant or antiplatelet                            agents. ASA Grade Assessment: II - A patient with                            mild systemic disease. After reviewing the risks  and benefits, the patient was deemed in                            satisfactory condition to undergo the procedure.                           After obtaining informed consent, the colonoscope                            was passed under direct vision. Throughout the                            procedure, the patient's blood pressure, pulse, and                            oxygen saturations were  monitored continuously. The                            Model CF-HQ190L [id number]) scope was introduced                            through the anus and advanced to the the cecum,                            identified by appendiceal orifice and ileocecal                            valve. The terminal ileum and the rectum were                            photographed. The quality of the bowel preparation                            was excellent. The colonoscopy was performed                            without difficulty. The patient tolerated the                            procedure well. The bowel preparation used was                            SUPREP. Scope In: 11:11:32 AM Scope Out: 11:27:37 AM Scope Withdrawal Time: 0 hours 14 minutes 22 seconds  Total Procedure Duration: 0 hours 16 minutes 5 seconds  Findings:                 The neo-terminal ileum appeared normal.                           There was evidence of a prior end-to-side                            ileo-colonic anastomosis in the ascending colon.  This was patent and was characterized by mild                            ulceration. The anastomosis was traversed.                           The exam was otherwise without abnormality on                            direct and retroflexion views. Complications:            No immediate complications. Estimated blood loss:                            None. Estimated Blood Loss:     Estimated blood loss: none. Impression:               - The examined portion of the ileum was normal. No                            active Crohn's disease.                           - Patent end-to-side ileo-colonic anastomosis,                            characterized by ulceration.                           - The examination was otherwise normal on direct                            and retroflexion views. No active Crohn's disease.                           - No specimens  collected. Recommendation:           - Repeat colonoscopy in 5 years for surveillance.                           - Patient has a contact number available for                            emergencies. The signs and symptoms of potential                            delayed complications were discussed with the                            patient. Return to normal activities tomorrow.                            Written discharge instructions were provided to the                            patient.                           -  Resume previous diet.                           - Continue present medications.                           - Routine office follow-up one year John N. Henrene Pastor, MD 08/10/2016 11:36:32 AM This report has been signed electronically.

## 2016-08-10 NOTE — Progress Notes (Signed)
Report to PACU, RN, vss, BBS= Clear.  

## 2016-08-12 ENCOUNTER — Encounter: Payer: Self-pay | Admitting: Family Medicine

## 2016-08-13 ENCOUNTER — Telehealth: Payer: Self-pay

## 2016-08-13 NOTE — Telephone Encounter (Signed)
   Follow up Call-  Call back number 08/10/2016  Post procedure Call Back phone  # [phone]  Permission to leave phone message Yes  Some recent data might be hidden     Patient questions:  Do you have a fever, pain , or abdominal swelling? No. Pain Score  0 *  Have you tolerated food without any problems? Yes.    Have you been able to return to your normal activities? Yes.    Do you have any questions about your discharge instructions: Diet   No. Medications  No. Follow up visit  No.  Do you have questions or concerns about your Care? No.  Actions: * If pain score is 4 or above: No action needed, pain <4.

## 2016-09-12 ENCOUNTER — Ambulatory Visit: Payer: Medicare Other

## 2016-09-18 ENCOUNTER — Ambulatory Visit (INDEPENDENT_AMBULATORY_CARE_PROVIDER_SITE_OTHER): Payer: Medicare Other

## 2016-09-18 ENCOUNTER — Ambulatory Visit: Payer: Medicare Other

## 2016-09-18 DIAGNOSIS — E538 Deficiency of other specified B group vitamins: Secondary | ICD-10-CM

## 2016-09-18 MED ORDER — CYANOCOBALAMIN 1000 MCG/ML IJ SOLN
1000.0000 ug | Freq: Once | INTRAMUSCULAR | Status: AC
  Administered 2016-09-18: 1000 ug via INTRAMUSCULAR

## 2016-09-26 ENCOUNTER — Ambulatory Visit: Payer: Medicare Other

## 2016-10-20 ENCOUNTER — Other Ambulatory Visit: Payer: Self-pay | Admitting: Family Medicine

## 2016-10-23 ENCOUNTER — Ambulatory Visit: Payer: Medicare Other

## 2016-11-01 ENCOUNTER — Ambulatory Visit: Payer: Medicare Other | Admitting: Family Medicine

## 2016-11-07 ENCOUNTER — Encounter: Payer: Self-pay | Admitting: Family Medicine

## 2016-11-07 ENCOUNTER — Ambulatory Visit (INDEPENDENT_AMBULATORY_CARE_PROVIDER_SITE_OTHER): Payer: Medicare Other | Admitting: Family Medicine

## 2016-11-07 VITALS — BP 146/82 | HR 77 | Temp 97.7°F | Wt 177.0 lb

## 2016-11-07 DIAGNOSIS — F411 Generalized anxiety disorder: Secondary | ICD-10-CM | POA: Diagnosis not present

## 2016-11-07 DIAGNOSIS — F5104 Psychophysiologic insomnia: Secondary | ICD-10-CM

## 2016-11-07 DIAGNOSIS — F4323 Adjustment disorder with mixed anxiety and depressed mood: Secondary | ICD-10-CM | POA: Diagnosis not present

## 2016-11-07 DIAGNOSIS — E538 Deficiency of other specified B group vitamins: Secondary | ICD-10-CM | POA: Diagnosis not present

## 2016-11-07 DIAGNOSIS — I10 Essential (primary) hypertension: Secondary | ICD-10-CM | POA: Diagnosis not present

## 2016-11-07 DIAGNOSIS — G4700 Insomnia, unspecified: Secondary | ICD-10-CM | POA: Insufficient documentation

## 2016-11-07 MED ORDER — CYANOCOBALAMIN 1000 MCG/ML IJ SOLN
1000.0000 ug | Freq: Once | INTRAMUSCULAR | Status: AC
  Administered 2016-11-07: 1000 ug via INTRAMUSCULAR

## 2016-11-07 MED ORDER — CITALOPRAM HYDROBROMIDE 10 MG PO TABS: 10.0000 mg | ORAL_TABLET | Freq: Every day | ORAL | 3 refills | Status: DC

## 2016-11-07 MED ORDER — LORAZEPAM 0.5 MG PO TABS: 0.2500 mg | ORAL_TABLET | Freq: Every evening | ORAL | 1 refills | Status: DC | PRN

## 2016-11-07 NOTE — Assessment & Plan Note (Signed)
Anticipate underlying depression/anxiety exacerbated by wife's recent illness. Discussed this.  Recommend start celexa 28m daily, start ativan 0.58m1/2-1 tab nightly PRN sleep.  Discussed common side effects of medication, discussed risks of controlled substances. Recommend temporary course of ativan to help sleep and anxiety issues.  RTC 1 mo f/u visit.  Pt declines counseling at this time, will look into local support groups for families affected by cancer.

## 2016-11-07 NOTE — Progress Notes (Signed)
BP (!) 146/82   Pulse 77   Temp 97.7 F (36.5 C) (Oral)   Wt 177 lb (80.3 kg)   SpO2 96%   BMI 24.34 kg/m   Rpt 150/86  CC: insomnia, HTN f/u Subjective:    Patient ID: Paul Atkinson, male    DOB: January 31, 1951, 66 y.o.   MRN: [id number]  HPI: Paul Atkinson is a 66 y.o. male presenting on 11/07/2016 for Follow-up (BP ) and Insomnia   Significantly increased stress. Wife recently diagnosed with colon cancer. s/p colectomy fortunately with clear margins. To start chemo this week.   He has had increased anxiety and trouble sleeping over the last several weeks. At times doesn't get any sleep. Longstanding h/o mild insomnia. He tried wife's valium which was helpful. He would like something to help.   Estranged from daughter over last 48 yrs - depressed mood since this. + anhedonia - due to lack of time. + insomnia. Appetite down. Energy levels ok. Concentration maybe down. Some guilt feelings. Feels he needs to be hypervigilant.  Fleeting suicidal thoughts without plan. Contracts for safety.  Anxiety over wife's care and being her sole caregiver.  Small support group.   H/o crohn's ileocolitis s/p ilocecectomy remotely - receives b12 shots ~monthly. Last b12 shot 08/2016. DUE. Last colonoscopy 07/2016 Henrene Pastor).   Bile salt induced diarrhea - on colestipol.   Relevant past medical, surgical, family and social history reviewed and updated as indicated. Interim medical history since our last visit reviewed. Allergies and medications reviewed and updated. Outpatient Medications Prior to Visit  Medication Sig Dispense Refill  . aspirin 81 MG tablet Take 81 mg by mouth daily.    . carvedilol (COREG) 3.125 MG tablet TAKE 1 TABLET (3.125 MG TOTAL) BY MOUTH 2 (TWO) TIMES DAILY WITH A MEAL. 60 tablet 0  . colestipol (COLESTID) 1 g tablet TAKE 4 TABLETS (4 G TOTAL) BY MOUTH DAILY. 120 tablet 0  . cyanocobalamin (,VITAMIN B-12,) 1000 MCG/ML injection Inject 1,000 mcg into the muscle every 30  (thirty) days. Chronic therapy    . fluticasone (FLONASE) 50 MCG/ACT nasal spray Place 1 spray into both nostrils daily as needed.      Facility-Administered Medications Prior to Visit  Medication Dose Route Frequency Provider Last Rate Last Dose  . 0.9 %  sodium chloride infusion  500 mL Intravenous Continuous Irene Shipper, MD         Per HPI unless specifically indicated in ROS section below Review of Systems     Objective:    BP (!) 146/82   Pulse 77   Temp 97.7 F (36.5 C) (Oral)   Wt 177 lb (80.3 kg)   SpO2 96%   BMI 24.34 kg/m   Wt Readings from Last 3 Encounters:  11/07/16 177 lb (80.3 kg)  08/10/16 181 lb (82.1 kg)  06/29/16 181 lb 4 oz (82.2 kg)    Physical Exam  Constitutional: He appears well-developed and well-nourished. No distress.  HENT:  Mouth/Throat: Oropharynx is clear and moist. No oropharyngeal exudate.  Eyes: Conjunctivae and EOM are normal. Pupils are equal, round, and reactive to light. No scleral icterus.  Neck: Normal range of motion. Neck supple. No thyromegaly present.  Cardiovascular: Normal rate, regular rhythm, normal heart sounds and intact distal pulses.   No murmur heard. Pulmonary/Chest: Effort normal and breath sounds normal. No respiratory distress. He has no wheezes. He has no rales.  Musculoskeletal: He exhibits no edema.  Lymphadenopathy:    He  has no cervical adenopathy.  Skin: Skin is warm and dry. No rash noted.  Psychiatric: He has a normal mood and affect. His speech is normal and behavior is normal. Judgment and thought content normal.  Nursing note and vitals reviewed.     Assessment & Plan:  Over 40 minutes were spent face-to-face with the patient during this encounter and >50% of that time was spent on counseling and coordination of care  Problem List Items Addressed This Visit    Adjustment disorder with mixed anxiety and depressed mood    Anticipate longstanding depression, worsened with recent wife illness. See above.   PHQ9 = 11, somewhat difficult to function GAD7 = 9      GAD (generalized anxiety disorder)    Anticipate underlying depression/anxiety exacerbated by wife's recent illness. Discussed this.  Recommend start celexa 49m daily, start ativan 0.549m1/2-1 tab nightly PRN sleep.  Discussed common side effects of medication, discussed risks of controlled substances. Recommend temporary course of ativan to help sleep and anxiety issues.  RTC 1 mo f/u visit.  Pt declines counseling at this time, will look into local support groups for families affected by cancer.       HYPERTENSION, BENIGN ESSENTIAL    BP mildly elevated today. Pt will start monitoring more closely at home. Will reassess at next visit.       Insomnia - Primary    Anticipate related to anxiety/depression - see above.  Will Rx ativan PRN sleep and work towards        Other Visit Diagnoses    B12 deficiency       Relevant Medications   cyanocobalamin ((VITAMIN B-12)) injection 1,000 mcg (Completed) (Start on 11/07/2016  9:15 AM)       Follow up plan: Return in about 4 weeks (around 12/05/2016) for follow up visit.  JaRia BushMD

## 2016-11-07 NOTE — Assessment & Plan Note (Signed)
BP mildly elevated today. Pt will start monitoring more closely at home. Will reassess at next visit.

## 2016-11-07 NOTE — Assessment & Plan Note (Addendum)
Anticipate longstanding depression, worsened with recent wife illness. See above.  PHQ9 = 11, somewhat difficult to function GAD7 = 9

## 2016-11-07 NOTE — Patient Instructions (Addendum)
b12 shot today.  Start checking blood pressures at home.  Start celexa 67m daily May use ativan 0.565m(1/2-1 tab) at night as needed for sleep. Think about counseling.  Return in 1 month for follow up visit.   Make sure you have good bedtime routine:  Sleep hygiene checklist: 1. Avoid naps during the day 2. Avoid stimulants such as caffeine and nicotine. Avoid bedtime alcohol (it can speed onset of sleep but the body's metabolism can cause awakenings). 3. All forms of exercise help ensure sound sleep - limit vigorous exercise to morning or late afternoon 4. Avoid food too close to bedtime including chocolate (which contains caffeine) 5. Soak up natural light 6. Establish regular bedtime routine. 7. Associate bed with sleep - avoid TV, computer or phone, reading while in bed. 8. Ensure pleasant, relaxing sleep environment - quiet, dark, cool room.

## 2016-11-07 NOTE — Assessment & Plan Note (Signed)
Anticipate related to anxiety/depression - see above.  Will Rx ativan PRN sleep and work towards

## 2016-11-14 ENCOUNTER — Other Ambulatory Visit: Payer: Self-pay | Admitting: Family Medicine

## 2016-11-16 ENCOUNTER — Telehealth: Payer: Self-pay

## 2016-11-16 DIAGNOSIS — F411 Generalized anxiety disorder: Secondary | ICD-10-CM

## 2016-11-16 DIAGNOSIS — F4323 Adjustment disorder with mixed anxiety and depressed mood: Secondary | ICD-10-CM

## 2016-11-16 NOTE — Telephone Encounter (Signed)
Called below # and spoke w/ patient and wife w/ his permission.  Ok to increase lorazepam to .75 and 1.0 mg at bedtime, call me next week with an update.  Pt agrees to counseling - will place referral and see if we can expedite.

## 2016-11-16 NOTE — Telephone Encounter (Signed)
pts wife (DPR signed) left note at front desk; Mrs devincenzi is concerned; pt seen 11/07/16; Mrs Crofford believes pt takes his meds but thinks that he delays taking his Ativan. Pt is quieter, less willing to talk, more short tempered, coping less,less willing to look toward the future.pt seems "flat". Pt has a harder time switching gears. Takes less joy out of things that used to give him a lot of joy. He is not sleeping long; cannot turn TV off to go to sleep. Pt is hyper about Mrs Lozinski getting her meds. They have been married 73 yrs and pt is not acting normal. Mrs Godinho thinks pt needs talk therapy ASAP and possibly different med. Mrs Hew request cb.

## 2016-11-22 ENCOUNTER — Other Ambulatory Visit: Payer: Self-pay | Admitting: Family Medicine

## 2016-11-22 NOTE — Telephone Encounter (Signed)
Called patient and wife back. Paul Atkinson has an opening in [place] office for 12/14/16. Patients wife will call [facility] directly to schedule the appointment if its available. Our office is scheduleing into August for a New Appointment.

## 2016-11-26 ENCOUNTER — Encounter: Payer: Self-pay | Admitting: Family Medicine

## 2016-12-12 ENCOUNTER — Ambulatory Visit (INDEPENDENT_AMBULATORY_CARE_PROVIDER_SITE_OTHER): Payer: Medicare Other | Admitting: Family Medicine

## 2016-12-12 ENCOUNTER — Encounter: Payer: Self-pay | Admitting: Family Medicine

## 2016-12-12 VITALS — BP 140/82 | HR 60 | Temp 97.9°F | Ht 71.5 in | Wt 176.5 lb

## 2016-12-12 DIAGNOSIS — F4323 Adjustment disorder with mixed anxiety and depressed mood: Secondary | ICD-10-CM | POA: Diagnosis not present

## 2016-12-12 DIAGNOSIS — D518 Other vitamin B12 deficiency anemias: Secondary | ICD-10-CM | POA: Diagnosis not present

## 2016-12-12 MED ORDER — CITALOPRAM HYDROBROMIDE 10 MG PO TABS: 10.0000 mg | ORAL_TABLET | Freq: Every day | ORAL | 6 refills | Status: DC

## 2016-12-12 MED ORDER — CYANOCOBALAMIN 1000 MCG/ML IJ SOLN
1000.0000 ug | Freq: Once | INTRAMUSCULAR | Status: AC
  Administered 2016-12-12: 1000 ug via INTRAMUSCULAR

## 2016-12-12 NOTE — Assessment & Plan Note (Addendum)
Overall doing better on celexa 38m daily with lorazepam PRN sleep. Support provided. He declines counseling at this time. Continue current regimen. RTC 3 mo f/u visit.  PHQ9 = 11 --> 2

## 2016-12-12 NOTE — Assessment & Plan Note (Signed)
b12 shot today.

## 2016-12-12 NOTE — Patient Instructions (Addendum)
b12 shot today. Let me know when we need lorazepam refill.  Continue celexa 47m for now.  Continue group therapy Return as needed or in 3 months for wellness visit with LKatha Cabaland physical with me.

## 2016-12-12 NOTE — Progress Notes (Addendum)
BP 140/82   Pulse 60   Temp 97.9 F (36.6 C)   Ht 5' 11.5" (1.816 m)   Wt 176 lb 8 oz (80.1 kg)   SpO2 97%   BMI 24.27 kg/m    CC: 1 mo f/u visit Subjective:    Patient ID: Paul Atkinson, male    DOB: Jun 13, 1951, 66 y.o.   MRN: [id number]  HPI: Paul Atkinson is a 66 y.o. male presenting on 12/12/2016 for Follow-up   See prior office and phone note for details. Wife has been through 2 chemo sessions.   Last visit we started celexa 35m daily. Lorazepam helps sleep, uses sparingly. [facility]. He has decided not to seek counseling at this time. He has been to several group counseling sessions. He feels he is doing well on current regimen.   Relevant past medical, surgical, family and social history reviewed and updated as indicated. Interim medical history since our last visit reviewed. Allergies and medications reviewed and updated. Outpatient Medications Prior to Visit  Medication Sig Dispense Refill  . aspirin 81 MG tablet Take 81 mg by mouth daily.    . carvedilol (COREG) 3.125 MG tablet TAKE 1 TABLET (3.125 MG TOTAL) BY MOUTH 2 (TWO) TIMES DAILY WITH A MEAL. 60 tablet 0  . colestipol (COLESTID) 1 g tablet TAKE 4 TABLETS (4 G TOTAL) BY MOUTH DAILY. 120 tablet 0  . cyanocobalamin (,VITAMIN B-12,) 1000 MCG/ML injection Inject 1,000 mcg into the muscle every 30 (thirty) days. Chronic therapy    . fluticasone (FLONASE) 50 MCG/ACT nasal spray Place 1 spray into both nostrils daily as needed.     .Marland KitchenLORazepam (ATIVAN) 0.5 MG tablet Take 0.5-1 tablets (0.25-0.5 mg total) by mouth at bedtime as needed for anxiety or sleep. 30 tablet 1  . citalopram (CELEXA) 10 MG tablet Take 1 tablet (10 mg total) by mouth daily. 30 tablet 3   Facility-Administered Medications Prior to Visit  Medication Dose Route Frequency Provider Last Rate Last Dose  . 0.9 %  sodium chloride infusion  500 mL Intravenous Continuous PIrene Shipper MD         Per HPI unless  specifically indicated in ROS section below Review of Systems     Objective:    BP 140/82   Pulse 60   Temp 97.9 F (36.6 C)   Ht 5' 11.5" (1.816 m)   Wt 176 lb 8 oz (80.1 kg)   SpO2 97%   BMI 24.27 kg/m   Wt Readings from Last 3 Encounters:  12/12/16 176 lb 8 oz (80.1 kg)  11/07/16 177 lb (80.3 kg)  08/10/16 181 lb (82.1 kg)    Physical Exam  Constitutional: He appears well-developed and well-nourished. No distress.  Psychiatric: He has a normal mood and affect. His behavior is normal. Judgment and thought content normal.  Nursing note and vitals reviewed.  Results for orders placed or performed in visit on 09/27/15  Hepatitis C antibody  Result Value Ref Range   HCV Ab NEGATIVE NEGATIVE      Assessment & Plan:   Problem List Items Addressed This Visit    Adjustment disorder with mixed anxiety and depressed mood - Primary    Overall doing better on celexa 161mdaily with lorazepam PRN sleep. Support provided. He declines counseling at this time. Continue current regimen. RTC 3 mo f/u visit.  PHQ9 = 11 --> 2      ANEMIA, VITAMIN B12 DEFICIENCY  NEC    b12 shot today.       Relevant Medications   cyanocobalamin ((VITAMIN B-12)) injection 1,000 mcg (Completed)       Follow up plan: Return in about 3 months (around 03/14/2017) for annual exam, prior fasting for blood work, medicare wellness visit.  Ria Bush, MD

## 2016-12-27 ENCOUNTER — Other Ambulatory Visit: Payer: Self-pay | Admitting: Family Medicine

## 2016-12-29 ENCOUNTER — Other Ambulatory Visit: Payer: Self-pay | Admitting: Internal Medicine

## 2016-12-31 ENCOUNTER — Telehealth: Payer: Self-pay | Admitting: Internal Medicine

## 2016-12-31 NOTE — Telephone Encounter (Signed)
Lm on vm 

## 2017-01-16 ENCOUNTER — Ambulatory Visit: Payer: Medicare Other

## 2017-02-25 ENCOUNTER — Other Ambulatory Visit: Payer: Self-pay | Admitting: Internal Medicine

## 2017-03-12 ENCOUNTER — Other Ambulatory Visit: Payer: Self-pay | Admitting: Family Medicine

## 2017-03-12 ENCOUNTER — Ambulatory Visit (INDEPENDENT_AMBULATORY_CARE_PROVIDER_SITE_OTHER): Payer: Medicare Other

## 2017-03-12 VITALS — BP 140/84 | HR 68 | Temp 98.5°F | Ht 71.25 in | Wt 178.0 lb

## 2017-03-12 DIAGNOSIS — Z0000 Encounter for general adult medical examination without abnormal findings: Secondary | ICD-10-CM | POA: Diagnosis not present

## 2017-03-12 DIAGNOSIS — E785 Hyperlipidemia, unspecified: Secondary | ICD-10-CM

## 2017-03-12 DIAGNOSIS — Z125 Encounter for screening for malignant neoplasm of prostate: Secondary | ICD-10-CM | POA: Diagnosis not present

## 2017-03-12 DIAGNOSIS — D518 Other vitamin B12 deficiency anemias: Secondary | ICD-10-CM | POA: Diagnosis not present

## 2017-03-12 DIAGNOSIS — I10 Essential (primary) hypertension: Secondary | ICD-10-CM

## 2017-03-12 DIAGNOSIS — Z23 Encounter for immunization: Secondary | ICD-10-CM | POA: Diagnosis not present

## 2017-03-12 LAB — COMPREHENSIVE METABOLIC PANEL
ALT: 11 U/L (ref 0–53)
AST: 16 U/L (ref 0–37)
Albumin: 4.2 g/dL (ref 3.5–5.2)
Alkaline Phosphatase: 60 U/L (ref 39–117)
BILIRUBIN TOTAL: 1 mg/dL (ref 0.2–1.2)
BUN: 14 mg/dL (ref 6–23)
CHLORIDE: 105 meq/L (ref 96–112)
CO2: 29 meq/L (ref 19–32)
Calcium: 9.3 mg/dL (ref 8.4–10.5)
Creatinine, Ser: 0.91 mg/dL (ref 0.40–1.50)
GFR: 88.27 mL/min (ref >60.00)
GLUCOSE: 101 mg/dL — AB (ref 70–99)
Potassium: 4.4 mEq/L (ref 3.5–5.1)
Sodium: 139 mEq/L (ref 135–145)
Total Protein: 6.7 g/dL (ref 6.0–8.3)

## 2017-03-12 LAB — CBC WITH DIFFERENTIAL/PLATELET
Basophils Absolute: 0 10*3/uL (ref 0.0–0.1)
Basophils Relative: 0.4 % (ref 0.0–3.0)
EOS PCT: 3.9 % (ref 0.0–5.0)
Eosinophils Absolute: 0.2 10*3/uL (ref 0.0–0.7)
HCT: 37.9 % — ABNORMAL LOW (ref 39.0–52.0)
Hemoglobin: 12.9 g/dL — ABNORMAL LOW (ref 13.0–17.0)
LYMPHS ABS: 0.8 10*3/uL (ref 0.7–4.0)
Lymphocytes Relative: 17 % (ref 12.0–46.0)
MCHC: 33.9 g/dL (ref 30.0–36.0)
MCV: 91.9 fl (ref 78.0–100.0)
MONOS PCT: 10.6 % (ref 3.0–12.0)
Monocytes Absolute: 0.5 10*3/uL (ref 0.1–1.0)
NEUTROS ABS: 3.4 10*3/uL (ref 1.4–7.7)
NEUTROS PCT: 68.1 % (ref 43.0–77.0)
Platelets: 224 10*3/uL (ref 150.0–400.0)
RBC: 4.12 Mil/uL — AB (ref 4.22–5.81)
RDW: 13.5 % (ref 11.5–15.5)
WBC: 5 10*3/uL (ref 4.0–10.5)

## 2017-03-12 LAB — LIPID PANEL
CHOL/HDL RATIO: 2
Cholesterol: 134 mg/dL (ref 0–200)
HDL: 56.6 mg/dL (ref >39.00)
LDL CALC: 43 mg/dL (ref 0–99)
NONHDL: 77.81
Triglycerides: 174 mg/dL — ABNORMAL HIGH (ref 0.0–149.0)
VLDL: 34.8 mg/dL (ref 0.0–40.0)

## 2017-03-12 LAB — VITAMIN B12: Vitamin B-12: >1500 pg/mL — ABNORMAL HIGH (ref 211–911)

## 2017-03-12 LAB — PSA: PSA: 1.31 ng/mL (ref 0.10–4.00)

## 2017-03-12 MED ORDER — CYANOCOBALAMIN 1000 MCG/ML IJ SOLN
1000.0000 ug | Freq: Once | INTRAMUSCULAR | Status: AC
  Administered 2017-03-12: 1000 ug via INTRAMUSCULAR

## 2017-03-12 NOTE — Progress Notes (Signed)
Subjective:   Paul Atkinson is a 66 y.o. male who presents for Medicare Annual/Subsequent preventive examination.  Review of Systems:  N/A Cardiac Risk Factors include: advanced age (>69mn, >>63women);male gender;dyslipidemia;hypertension     Objective:    Vitals: BP 140/84 (BP Location: Right Arm, Patient Position: Sitting, Cuff Size: Normal)   Pulse 68   Temp 98.5 F (36.9 C) (Oral)   Ht 5' 11.25" (1.81 m) Comment: no shoes  Wt 178 lb (80.7 kg)   SpO2 96%   BMI 24.65 kg/m   Body mass index is 24.65 kg/m.  Tobacco History  Smoking Status  . Never Smoker  Smokeless Tobacco  . Never Used     Counseling given: No   Past Medical History:  Diagnosis Date  . Anemia    iron def  . Bile salt-induced diarrhea   . Crohn disease (H[facility]rian    status post ileocecectomy 1989  . Disorder of inner ear   . Hearing loss   . Hx of duodenal ulcer   . Hx SBO   . Hypertension   . Malabsorption   . Psoriasis   . Ulcer   . Vitamin B 12 deficiency    secondary to ileal resection   Past Surgical History:  Procedure Laterality Date  . COLONOSCOPY  07/2016   patent anastomosis, no polyps, rpt 5 yrs (Henrene Pastor  . ILEOCECETOMY  1989   peritonitis with SBO from crohn's  . MOLE REMOVAL     back  . MOUTH SURGERY    . SEPTOPLASTY  05/2004   2nd to dev. septum (Dr. JMyna Hidalgo   Family History  Problem Relation Age of Onset  . Heart disease Mother        CHF, arrhythmias  . Kidney disease Mother        kidney failure  . Hypertension Mother   . Kidney disease Father        renal failure  . CAD Father 826      MI stents  . Diabetes Father 80       on insulin  . Hyperlipidemia Father   . Cancer Neg Hx   . Colon cancer Neg Hx    History  Sexual Activity  . Sexual activity: Yes    Outpatient Encounter Prescriptions as of 03/12/2017  Medication Sig  . carvedilol (COREG) 3.125 MG tablet TAKE 1 TABLET (3.125 MG TOTAL) BY MOUTH 2 (TWO) TIMES DAILY WITH A MEAL.  . colestipol  (COLESTID) 1 g tablet TAKE 4 TABLETS BY MOUTH EVERY DAY  . cyanocobalamin (,VITAMIN B-12,) 1000 MCG/ML injection Inject 1,000 mcg into the muscle every 30 (thirty) days. Chronic therapy  . fluticasone (FLONASE) 50 MCG/ACT nasal spray Place 1 spray into both nostrils daily as needed.   .Marland Kitchenaspirin 81 MG tablet Take 81 mg by mouth daily.  . citalopram (CELEXA) 10 MG tablet Take 1 tablet (10 mg total) by mouth daily. (Patient not taking: Reported on 03/12/2017)  . LORazepam (ATIVAN) 0.5 MG tablet Take 0.5-1 tablets (0.25-0.5 mg total) by mouth at bedtime as needed for anxiety or sleep. (Patient not taking: Reported on 03/12/2017)   Facility-Administered Encounter Medications as of 03/12/2017  Medication  . 0.9 %  sodium chloride infusion  . [COMPLETED] cyanocobalamin ((VITAMIN B-12)) injection 1,000 mcg    Activities of Daily Living In your present state of health, do you have any difficulty performing the following activities: 03/12/2017  Hearing? Y  Comment tinnitus in both ears  Vision? N  Difficulty concentrating or making decisions? N  Walking or climbing stairs? Y  Dressing or bathing? N  Doing errands, shopping? N  Preparing Food and eating ? N  Using the Toilet? N  In the past six months, have you accidently leaked urine? N  Do you have problems with loss of bowel control? N  Managing your Medications? N  Managing your Finances? N  Housekeeping or managing your Housekeeping? N  Some recent data might be hidden    Patient Care Team: Ria Bush, MD as PCP - General (Family Medicine)   Assessment:     Hearing Screening   125Hz  250Hz  500Hz  1000Hz  2000Hz  3000Hz  4000Hz  6000Hz  8000Hz   Right ear:   40 0 0  0    Left ear:   40 0 40  0      Visual Acuity Screening   Right eye Left eye Both eyes  Without correction: 20/25 20/40 20/25-1  With correction:       Exercise Activities and Dietary recommendations Current Exercise Habits: The patient does not participate in regular  exercise at present, Exercise limited by: None identified  Goals    . Eat more fruits and vegetables          Starting 03/12/2017, I will continue to eat at least 4-5 servings of vegetables daily.       Fall Risk Fall Risk  03/12/2017 10/04/2015  Falls in the past year? No No   Depression Screen PHQ 2/9 Scores 03/12/2017 12/12/2016 11/07/2016 10/04/2015  PHQ - 2 Score 0 2 2 0  PHQ- 9 Score 1 2 11  -    Cognitive Function MMSE - Mini Mental State Exam 03/12/2017  Orientation to time 5  Orientation to Place 5  Registration 3  Attention/ Calculation 0  Recall 3  Language- name 2 objects 0  Language- repeat 1  Language- follow 3 step command 3  Language- read & follow direction 0  Write a sentence 0  Copy design 0  Total score 20     PLEASE NOTE: A Mini-Cog screen was completed. Maximum score is 20. A value of 0 denotes this part of Folstein MMSE was not completed or the patient failed this part of the Mini-Cog screening.   Mini-Cog Screening Orientation to Time - Max 5 pts Orientation to Place - Max 5 pts Registration - Max 3 pts Recall - Max 3 pts Language Repeat - Max 1 pts Language Follow 3 Step Command - Max 3 pts     Immunization History  Administered Date(s) Administered  . H1N1 07/01/2008  . Influenza Split 03/29/2011, 04/25/2012  . Influenza Whole 03/26/2006, 03/28/2007, 03/10/2008, 05/26/2009, 03/23/2010  . Influenza, Seasonal, Injecte, Preservative Fre 04/26/2014  . Influenza,inj,Quad PF,6+ Mos 04/27/2015, 04/24/2016, 03/12/2017  . Influenza-Unspecified 03/25/2014  . Pneumococcal Conjugate-13 10/04/2015  . Pneumococcal Polysaccharide-23 03/12/2017  . Td 11/07/2005, 08/30/2010  . Zoster 08/30/2010   Screening Tests Health Maintenance  Topic Date Due  . DTaP/Tdap/Td (1 - Tdap) 08/29/2020 (Originally 08/31/2010)  . TETANUS/TDAP  08/29/2020  . COLONOSCOPY  08/10/2021  . INFLUENZA VACCINE  Completed  . Hepatitis C Screening  Completed  . PNA vac Low Risk  Adult  Completed      Plan:     I have personally reviewed and addressed the Medicare Annual Wellness questionnaire and have noted the following in the patient's chart:  A. Medical and social history B. Use of alcohol, tobacco or illicit drugs  C. Current medications and supplements D. Functional ability and  status E.  Nutritional status F.  Physical activity G. Advance directives H. List of other physicians I.  Hospitalizations, surgeries, and ER visits in previous 12 months J.  [place] to include hearing, vision, cognitive, depression L. Referrals and appointments - none  In addition, I have reviewed and discussed with patient certain preventive protocols, quality metrics, and best practice recommendations. A written personalized care plan for preventive services as well as general preventive health recommendations were provided to patient.  See attached scanned questionnaire for additional information.   Signed,   Lindell Noe, MHA, BS, LPN Health Coach

## 2017-03-12 NOTE — Progress Notes (Signed)
Pre visit review using our clinic review tool, if applicable. No additional management support is needed unless otherwise documented below in the visit note. 

## 2017-03-12 NOTE — Progress Notes (Signed)
PCP notes:   Health maintenance:  Flu vaccine - administered PPSV23 - administered  Abnormal screenings:   Depression score: 1 Hearing - failed  Hearing Screening   125Hz  250Hz  500Hz  1000Hz  2000Hz  3000Hz  4000Hz  6000Hz  8000Hz   Right ear:   40 0 0  0    Left ear:   40 0 40  0     Patient concerns:   Coreg - due to psoriasis, patient would like to discuss an alternative low cost medication  Shingrix - PCP please discuss at next appt  Nurse concerns:  B12 injection administered per order.   Next PCP appt:   03/18/17 @ 0830

## 2017-03-12 NOTE — Patient Instructions (Signed)
Paul Atkinson , Thank you for taking time to come for your Medicare Wellness Visit. I appreciate your ongoing commitment to your health goals. Please review the following plan we discussed and let me know if I can assist you in the future.   These are the goals we discussed: Goals    . Eat more fruits and vegetables          Starting 03/12/2017, I will continue to eat at least 4-5 servings of vegetables daily.        This is a list of the screening recommended for you and due dates:  Health Maintenance  Topic Date Due  . DTaP/Tdap/Td vaccine (1 - Tdap) 08/29/2020*  . Tetanus Vaccine  08/29/2020  . Colon Cancer Screening  08/10/2021  . Flu Shot  Completed  .  Hepatitis C: One time screening is recommended by Center for Disease Control  (CDC) for  adults born from [birth year] through 1966.   Completed  . Pneumonia vaccines  Completed  *Topic was postponed. The date shown is not the original due date.   Preventive Care for Adults  A healthy lifestyle and preventive care can promote health and wellness. Preventive health guidelines for adults include the following key practices.  . A routine yearly physical is a good way to check with your health care provider about your health and preventive screening. It is a chance to share any concerns and updates on your health and to receive a thorough exam.  . Visit your dentist for a routine exam and preventive care every 6 months. Brush your teeth twice a day and floss once a day. Good oral hygiene prevents tooth decay and gum disease.  . The frequency of eye exams is based on your age, health, family medical history, use  of contact lenses, and other factors. Follow your health care provider's ecommendations for frequency of eye exams.  . Eat a healthy diet. Foods like vegetables, fruits, whole grains, low-fat dairy products, and lean protein foods contain the nutrients you need without too many calories. Decrease your intake of foods high in solid  fats, added sugars, and salt. Eat the right amount of calories for you. Get information about a proper diet from your health care provider, if necessary.  . Regular physical exercise is one of the most important things you can do for your health. Most adults should get at least 150 minutes of moderate-intensity exercise (any activity that increases your heart rate and causes you to sweat) each week. In addition, most adults need muscle-strengthening exercises on 2 or more days a week.  Silver Sneakers may be a benefit available to you. To determine eligibility, you may visit the website: www.silversneakers.com or contact program at 724-435-9443 Mon-Fri between 8AM-8PM.   . Maintain a healthy weight. The body mass index (BMI) is a screening tool to identify possible weight problems. It provides an estimate of body fat based on height and weight. Your health care provider can find your BMI and can help you achieve or maintain a healthy weight.   For adults 20 years and older: ? A BMI below 18.5 is considered underweight. ? A BMI of 18.5 to 24.9 is normal. ? A BMI of 25 to 29.9 is considered overweight. ? A BMI of 30 and above is considered obese.   . Maintain normal blood lipids and cholesterol levels by exercising and minimizing your intake of saturated fat. Eat a balanced diet with plenty of fruit and vegetables. Blood tests for  lipids and cholesterol should begin at age 69 and be repeated every 5 years. If your lipid or cholesterol levels are high, you are over 50, or you are at high risk for heart disease, you may need your cholesterol levels checked more frequently. Ongoing high lipid and cholesterol levels should be treated with medicines if diet and exercise are not working.  . If you smoke, find out from your health care provider how to quit. If you do not use tobacco, please do not start.  . If you choose to drink alcohol, please do not consume more than 2 drinks per day. One drink is  considered to be 12 ounces (355 mL) of beer, 5 ounces (148 mL) of wine, or 1.5 ounces (44 mL) of liquor.  . If you are 60-24 years old, ask your health care provider if you should take aspirin to prevent strokes.  . Use sunscreen. Apply sunscreen liberally and repeatedly throughout the day. You should seek shade when your shadow is shorter than you. Protect yourself by wearing long sleeves, pants, a wide-brimmed hat, and sunglasses year round, whenever you are outdoors.  . Once a month, do a whole body skin exam, using a mirror to look at the skin on your back. Tell your health care provider of new moles, moles that have irregular borders, moles that are larger than a pencil eraser, or moles that have changed in shape or color.

## 2017-03-14 ENCOUNTER — Ambulatory Visit: Payer: Medicare Other

## 2017-03-16 NOTE — Progress Notes (Signed)
I reviewed health advisor's note, was available for consultation, and agree with documentation and plan.  

## 2017-03-18 ENCOUNTER — Encounter: Payer: Self-pay | Admitting: Family Medicine

## 2017-03-18 ENCOUNTER — Ambulatory Visit (INDEPENDENT_AMBULATORY_CARE_PROVIDER_SITE_OTHER): Payer: Medicare Other | Admitting: Family Medicine

## 2017-03-18 VITALS — BP 120/62 | HR 85 | Temp 97.7°F | Wt 183.5 lb

## 2017-03-18 DIAGNOSIS — K9089 Other intestinal malabsorption: Secondary | ICD-10-CM | POA: Diagnosis not present

## 2017-03-18 DIAGNOSIS — E785 Hyperlipidemia, unspecified: Secondary | ICD-10-CM | POA: Diagnosis not present

## 2017-03-18 DIAGNOSIS — L409 Psoriasis, unspecified: Secondary | ICD-10-CM

## 2017-03-18 DIAGNOSIS — Z0000 Encounter for general adult medical examination without abnormal findings: Secondary | ICD-10-CM | POA: Diagnosis not present

## 2017-03-18 DIAGNOSIS — E538 Deficiency of other specified B group vitamins: Secondary | ICD-10-CM | POA: Diagnosis not present

## 2017-03-18 DIAGNOSIS — I10 Essential (primary) hypertension: Secondary | ICD-10-CM | POA: Diagnosis not present

## 2017-03-18 DIAGNOSIS — F4323 Adjustment disorder with mixed anxiety and depressed mood: Secondary | ICD-10-CM

## 2017-03-18 DIAGNOSIS — K5080 Crohn's disease of both small and large intestine without complications: Secondary | ICD-10-CM

## 2017-03-18 DIAGNOSIS — Z7189 Other specified counseling: Secondary | ICD-10-CM | POA: Diagnosis not present

## 2017-03-18 DIAGNOSIS — R7989 Other specified abnormal findings of blood chemistry: Secondary | ICD-10-CM

## 2017-03-18 MED ORDER — AMLODIPINE BESYLATE 5 MG PO TABS: 5.0000 mg | ORAL_TABLET | Freq: Every day | ORAL | 6 refills | Status: DC

## 2017-03-18 NOTE — Assessment & Plan Note (Signed)
Continue b12 shots.

## 2017-03-18 NOTE — Assessment & Plan Note (Signed)
Stable period.

## 2017-03-18 NOTE — Assessment & Plan Note (Signed)
Doing better as wife's cancer is doing better.

## 2017-03-18 NOTE — Assessment & Plan Note (Signed)
Chronic, stable on colestipol.

## 2017-03-18 NOTE — Progress Notes (Signed)
BP 120/62 (BP Location: Left Arm, Patient Position: Sitting, Cuff Size: Normal)   Pulse 85   Temp 97.7 F (36.5 C) (Oral)   Wt 183 lb 8 oz (83.2 kg)   SpO2 96%   BMI 25.41 kg/m    CC: CPE Subjective:    Patient ID: Paul Atkinson, male    DOB: June 30, 1950, 66 y.o.   MRN: [id number]  HPI: Paul Atkinson is a 66 y.o. male presenting on 03/18/2017 for Annual Exam (Pt 2. Wants to discuss continuing BP med)   Wife going through colon cancer treatment. Has been able to taper off celexa and lorazepam. Celexa caused vivid dreams.   H/o crohn's ileocolitis s/p ilocecectomy remotely - receives b12 shots ~monthly. Last b12 shot 08/2016. DUE. Last colonoscopy 07/2016 Paul Atkinson).   Bile salt induced diarrhea - on colestipol.   Wants to discuss alternative to coreg - worried it is worsening psoriasis. On coreg since 2000. Treating psoriasis with UVB light box.   Saw Paul Atkinson last week for medicare wellness visit. Note reviewed. Failed hearing R>L ears. Planning on seeing audiologist at [facility].   Preventative: Colonoscopy - 07/2016, stable.patent anastomosis. Rec rpt 5 years (Dr. Henrene Atkinson) Prostate cancer screening - minor sxs, attributed to enlarged prostate.Nocturia x1.Previously agreed to stop screening for now as long as PSA remains stable. Flu shot yearly Pneumovax 02/2017, prevnar 09/2015 Td 2012 zostavax 2012 shingrix - discussed Advanced directives: has at home. Planning to redo. HCPOA is wife. Will bring copy. Discussed seat belt use. Discussed sunscreen use. No changing moles on skin. Sees derm yearly.  Non smoker Alcohol - rarely  Occasional caffeine  Lives with wife, no pets  Occupation: retired, Medical illustrator, part-time handyman  Activity: no regular exercise but stays active  Diet: good water, fruits/vegetables some   Relevant past medical, surgical, family and social history reviewed and updated as indicated. Interim medical history since our last visit reviewed. Allergies  and medications reviewed and updated. Outpatient Medications Prior to Visit  Medication Sig Dispense Refill  . aspirin 81 MG tablet Take 81 mg by mouth daily.    . carvedilol (COREG) 3.125 MG tablet TAKE 1 TABLET (3.125 MG TOTAL) BY MOUTH 2 (TWO) TIMES DAILY WITH A MEAL. 60 tablet 5  . colestipol (COLESTID) 1 g tablet TAKE 4 TABLETS BY MOUTH EVERY DAY 120 tablet 3  . cyanocobalamin (,VITAMIN B-12,) 1000 MCG/ML injection Inject 1,000 mcg into the muscle every 30 (thirty) days. Chronic therapy    . fluticasone (FLONASE) 50 MCG/ACT nasal spray Place 1 spray into both nostrils daily as needed.     . citalopram (CELEXA) 10 MG tablet Take 1 tablet (10 mg total) by mouth daily. 30 tablet 6  . LORazepam (ATIVAN) 0.5 MG tablet Take 0.5-1 tablets (0.25-0.5 mg total) by mouth at bedtime as needed for anxiety or sleep. 30 tablet 1   Facility-Administered Medications Prior to Visit  Medication Dose Route Frequency Provider Last Rate Last Dose  . 0.9 %  sodium chloride infusion  500 mL Intravenous Continuous Paul Shipper, MD         Per HPI unless specifically indicated in ROS section below Review of Systems  Constitutional: Negative for activity change, appetite change, chills, fatigue, fever and unexpected weight change.  HENT: Negative for hearing loss.   Eyes: Negative for visual disturbance.  Respiratory: Negative for cough, chest tightness, shortness of breath and wheezing.   Cardiovascular: Negative for chest pain, palpitations and leg swelling.  Gastrointestinal: Negative for abdominal  distention, abdominal pain, blood in stool, constipation, diarrhea, nausea and vomiting.  Genitourinary: Negative for difficulty urinating and hematuria.  Musculoskeletal: Negative for arthralgias, myalgias and neck pain.  Skin: Negative for rash.  Neurological: Positive for dizziness (mild vertigo, ?inner ear related). Negative for seizures, syncope and headaches.  Hematological: Negative for adenopathy. Does  not bruise/bleed easily.  Psychiatric/Behavioral: Negative for dysphoric mood. The patient is not nervous/anxious.        Objective:    BP 120/62 (BP Location: Left Arm, Patient Position: Sitting, Cuff Size: Normal)   Pulse 85   Temp 97.7 F (36.5 C) (Oral)   Wt 183 lb 8 oz (83.2 kg)   SpO2 96%   BMI 25.41 kg/m   Wt Readings from Last 3 Encounters:  03/18/17 183 lb 8 oz (83.2 kg)  03/12/17 178 lb (80.7 kg)  12/12/16 176 lb 8 oz (80.1 kg)    Physical Exam  Constitutional: He is oriented to person, place, and time. He appears well-developed and well-nourished. No distress.  HENT:  Head: Normocephalic and atraumatic.  Right Ear: Hearing, tympanic membrane, external ear and ear canal normal.  Left Ear: Hearing, tympanic membrane, external ear and ear canal normal.  Nose: Nose normal.  Mouth/Throat: Uvula is midline, oropharynx is clear and moist and mucous membranes are normal. No oropharyngeal exudate, posterior oropharyngeal edema or posterior oropharyngeal erythema.  Eyes: Pupils are equal, round, and reactive to light. Conjunctivae and EOM are normal. No scleral icterus.  Neck: Normal range of motion. Neck supple. Carotid bruit is not present. No thyromegaly present.  Cardiovascular: Normal rate, regular rhythm, normal heart sounds and intact distal pulses.   No murmur heard. Pulses:      Radial pulses are 2+ on the right side, and 2+ on the left side.  Pulmonary/Chest: Effort normal and breath sounds normal. No respiratory distress. He has no wheezes. He has no rales.  Abdominal: Soft. Bowel sounds are normal. He exhibits no distension and no mass. There is no tenderness. There is no rebound and no guarding.  Musculoskeletal: Normal range of motion. He exhibits no edema.  Lymphadenopathy:    He has no cervical adenopathy.  Neurological: He is alert and oriented to person, place, and time.  CN grossly intact, station and gait intact  Skin: Skin is warm and dry. No rash noted.   Psychiatric: He has a normal mood and affect. His behavior is normal. Judgment and thought content normal.  Nursing note and vitals reviewed.  Results for orders placed or performed in visit on 03/12/17  Lipid panel  Result Value Ref Range   Cholesterol 134 0 - 200 mg/dL   Triglycerides 174.0 (H) 0.0 - 149.0 mg/dL   HDL 56.60 >39.00 mg/dL   VLDL 34.8 0.0 - 40.0 mg/dL   LDL Cholesterol 43 0 - 99 mg/dL   Total CHOL/HDL Ratio 2    NonHDL 77.81   Comprehensive metabolic panel  Result Value Ref Range   Sodium 139 135 - 145 mEq/L   Potassium 4.4 3.5 - 5.1 mEq/L   Chloride 105 96 - 112 mEq/L   CO2 29 19 - 32 mEq/L   Glucose, Bld 101 (H) 70 - 99 mg/dL   BUN 14 6 - 23 mg/dL   Creatinine, Ser 0.91 0.40 - 1.50 mg/dL   Total Bilirubin 1.0 0.2 - 1.2 mg/dL   Alkaline Phosphatase 60 39 - 117 U/L   AST 16 0 - 37 U/L   ALT 11 0 - 53 U/L  Total Protein 6.7 6.0 - 8.3 g/dL   Albumin 4.2 3.5 - 5.2 g/dL   Calcium 9.3 8.4 - 10.5 mg/dL   GFR 88.27 >60.00 mL/min  Vitamin B12  Result Value Ref Range   Vitamin B-12 >1500 (H) 211 - 911 pg/mL  PSA  Result Value Ref Range   PSA 1.31 0.10 - 4.00 ng/mL  CBC with Differential/Platelet  Result Value Ref Range   WBC 5.0 4.0 - 10.5 K/uL   RBC 4.12 (L) 4.22 - 5.81 Mil/uL   Hemoglobin 12.9 (L) 13.0 - 17.0 g/dL   HCT 37.9 (L) 39.0 - 52.0 %   MCV 91.9 78.0 - 100.0 fl   MCHC 33.9 30.0 - 36.0 g/dL   RDW 13.5 11.5 - 15.5 %   Platelets 224.0 150.0 - 400.0 K/uL   Neutrophils Relative % 68.1 43.0 - 77.0 %   Lymphocytes Relative 17.0 12.0 - 46.0 %   Monocytes Relative 10.6 3.0 - 12.0 %   Eosinophils Relative 3.9 0.0 - 5.0 %   Basophils Relative 0.4 0.0 - 3.0 %   Neutro Abs 3.4 1.4 - 7.7 K/uL   Lymphs Abs 0.8 0.7 - 4.0 K/uL   Monocytes Absolute 0.5 0.1 - 1.0 K/uL   Eosinophils Absolute 0.2 0.0 - 0.7 K/uL   Basophils Absolute 0.0 0.0 - 0.1 K/uL      Assessment & Plan:   Problem List Items Addressed This Visit    Adjustment disorder with mixed anxiety  and depressed mood    Doing better as wife's cancer is doing better.       Advanced care planning/counseling discussion    Advanced directives: has at home. Planning to redo. HCPOA is wife. Will bring copy.      Bile salt-induced diarrhea    Continue colestipol.       Crohn's ileocolitis s/p remote ileostomy    Stable period.       Dyslipidemia    Chronic, stable on colestipol.       Healthcare maintenance - Primary    Preventative protocols reviewed and updated unless pt declined. Discussed healthy diet and lifestyle.       HYPERTENSION, BENIGN ESSENTIAL    Chronic, stable. Will transition from beta blocker to amlodipine and monitor effect on psoriasis. Reviewed side effects to watch on [facility].       Relevant Medications   amLODipine (NORVASC) 5 MG tablet   Low vitamin B12 level    Continue b12 shots.       Psoriasis    Manages at home with UVB light box. Will trial different antihypertensive (not beta blocker) and monitor.           Follow up plan: Return if symptoms worsen or fail to improve, for annual exam, prior fasting for blood work, medicare wellness visit.  Ria Bush, MD

## 2017-03-18 NOTE — Assessment & Plan Note (Addendum)
Advanced directives: has at home. Planning to redo. HCPOA is wife. Will bring copy.

## 2017-03-18 NOTE — Patient Instructions (Addendum)
Decrease coreg to nightly for 1 week then stop. At same time, start amlodipine 54m daily.  If interested, check with pharmacy about new 2 shot shingles series (shingrix).  You are doing well today. Return as needed or in 1 year for next physical and medicare wellness visit.   Health Maintenance, Male A healthy lifestyle and preventive care is important for your health and wellness. Ask your health care provider about what schedule of regular examinations is right for you. What should I know about weight and diet? Eat a Healthy Diet  Eat plenty of vegetables, fruits, whole grains, low-fat dairy products, and lean protein.  Do not eat a lot of foods high in solid fats, added sugars, or salt.  Maintain a Healthy Weight Regular exercise can help you achieve or maintain a healthy weight. You should:  Do at least 150 minutes of exercise each week. The exercise should increase your heart rate and make you sweat (moderate-intensity exercise).  Do strength-training exercises at least twice a week.  Watch Your Levels of Cholesterol and Blood Lipids  Have your blood tested for lipids and cholesterol every 5 years starting at 67years of age. If you are at high risk for heart disease, you should start having your blood tested when you are 67years old. You may need to have your cholesterol levels checked more often if: ? Your lipid or cholesterol levels are high. ? You are older than 67years of age. ? You are at high risk for heart disease.  What should I know about cancer screening? Many types of cancers can be detected early and may often be prevented. Lung Cancer  You should be screened every year for lung cancer if: ? You are a current smoker who has smoked for at least 30 years. ? You are a former smoker who has quit within the past 15 years.  Talk to your health care provider about your screening options, when you should start screening, and how often you should be  screened.  Colorectal Cancer  Routine colorectal cancer screening usually begins at 67years of age and should be repeated every 5-10 years until you are 67years old. You may need to be screened more often if early forms of precancerous polyps or small growths are found. Your health care provider may recommend screening at an earlier age if you have risk factors for colon cancer.  Your health care provider may recommend using home test kits to check for hidden blood in the stool.  A small camera at the end of a tube can be used to examine your colon (sigmoidoscopy or colonoscopy). This checks for the earliest forms of colorectal cancer.  Prostate and Testicular Cancer  Depending on your age and overall health, your health care provider may do certain tests to screen for prostate and testicular cancer.  Talk to your health care provider about any symptoms or concerns you have about testicular or prostate cancer.  Skin Cancer  Check your skin from head to toe regularly.  Tell your health care provider about any new moles or changes in moles, especially if: ? There is a change in a mole's size, shape, or color. ? You have a mole that is larger than a pencil eraser.  Always use sunscreen. Apply sunscreen liberally and repeat throughout the day.  Protect yourself by wearing long sleeves, pants, a wide-brimmed hat, and sunglasses when outside.  What should I know about heart disease, diabetes, and high blood pressure?  If you are 40-4 years of age, have your blood pressure checked every 3-5 years. If you are 70 years of age or older, have your blood pressure checked every year. You should have your blood pressure measured twice-once when you are at a hospital or clinic, and once when you are not at a hospital or clinic. Record the average of the two measurements. To check your blood pressure when you are not at a hospital or clinic, you can use: ? An automated blood pressure machine at a  pharmacy. ? A home blood pressure monitor.  Talk to your health care provider about your target blood pressure.  If you are between 6-27 years old, ask your health care provider if you should take aspirin to prevent heart disease.  Have regular diabetes screenings by checking your fasting blood sugar level. ? If you are at a normal weight and have a low risk for diabetes, have this test once every three years after the age of 36. ? If you are overweight and have a high risk for diabetes, consider being tested at a younger age or more often.  A one-time screening for abdominal aortic aneurysm (AAA) by ultrasound is recommended for men aged 4-75 years who are current or former smokers. What should I know about preventing infection? Hepatitis B If you have a higher risk for hepatitis B, you should be screened for this virus. Talk with your health care provider to find out if you are at risk for hepatitis B infection. Hepatitis C Blood testing is recommended for:  Everyone born from [birth year] through 1966.  Anyone with known risk factors for hepatitis C.  Sexually Transmitted Diseases (STDs)  You should be screened each year for STDs including gonorrhea and chlamydia if: ? You are sexually active and are younger than 67 years of age. ? You are older than 67 years of age and your health care provider tells you that you are at risk for this type of infection. ? Your sexual activity has changed since you were last screened and you are at an increased risk for chlamydia or gonorrhea. Ask your health care provider if you are at risk.  Talk with your health care provider about whether you are at high risk of being infected with HIV. Your health care provider may recommend a prescription medicine to help prevent HIV infection.  What else can I do?  Schedule regular health, dental, and eye exams.  Stay current with your vaccines (immunizations).  Do not use any tobacco products, such as  cigarettes, chewing tobacco, and e-cigarettes. If you need help quitting, ask your health care provider.  Limit alcohol intake to no more than 2 drinks per day. One drink equals 12 ounces of beer, 5 ounces of wine, or 1 ounces of hard liquor.  Do not use street drugs.  Do not share needles.  Ask your health care provider for help if you need support or information about quitting drugs.  Tell your health care provider if you often feel depressed.  Tell your health care provider if you have ever been abused or do not feel safe at home. This information is not intended to replace advice given to you by your health care provider. Make sure you discuss any questions you have with your health care provider. Document Released: 12/08/2007 Document Revised: 02/08/2016 Document Reviewed: 03/15/2015 Elsevier Interactive Patient Education  [employer].

## 2017-03-18 NOTE — Assessment & Plan Note (Signed)
Continue colestipol.

## 2017-03-18 NOTE — Assessment & Plan Note (Signed)
Chronic, stable. Will transition from beta blocker to amlodipine and monitor effect on psoriasis. Reviewed side effects to watch on CCB.

## 2017-03-18 NOTE — Assessment & Plan Note (Addendum)
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 Will trial different antihypertensive (not beta blocker) and monitor.

## 2017-03-18 NOTE — Assessment & Plan Note (Signed)
Preventative protocols reviewed and updated unless pt declined. Discussed healthy diet and lifestyle.  

## 2017-05-01 ENCOUNTER — Ambulatory Visit (INDEPENDENT_AMBULATORY_CARE_PROVIDER_SITE_OTHER): Payer: Medicare Other

## 2017-05-01 DIAGNOSIS — E538 Deficiency of other specified B group vitamins: Secondary | ICD-10-CM

## 2017-05-01 MED ORDER — CYANOCOBALAMIN 1000 MCG/ML IJ SOLN
1000.0000 ug | Freq: Once | INTRAMUSCULAR | Status: AC
  Administered 2017-05-01: 1000 ug via INTRAMUSCULAR

## 2017-06-19 ENCOUNTER — Ambulatory Visit (INDEPENDENT_AMBULATORY_CARE_PROVIDER_SITE_OTHER): Payer: Medicare Other

## 2017-06-19 DIAGNOSIS — E538 Deficiency of other specified B group vitamins: Secondary | ICD-10-CM

## 2017-06-19 MED ORDER — CYANOCOBALAMIN 1000 MCG/ML IJ SOLN
1000.0000 ug | Freq: Once | INTRAMUSCULAR | Status: AC
  Administered 2017-06-19: 1000 ug via INTRAMUSCULAR

## 2017-06-27 ENCOUNTER — Other Ambulatory Visit: Payer: Self-pay | Admitting: Internal Medicine

## 2017-07-23 ENCOUNTER — Ambulatory Visit (INDEPENDENT_AMBULATORY_CARE_PROVIDER_SITE_OTHER): Payer: Medicare Other

## 2017-07-23 DIAGNOSIS — D518 Other vitamin B12 deficiency anemias: Secondary | ICD-10-CM | POA: Diagnosis not present

## 2017-07-23 MED ORDER — CYANOCOBALAMIN 1000 MCG/ML IJ SOLN
1000.0000 ug | Freq: Once | INTRAMUSCULAR | Status: AC
  Administered 2017-07-23: 1000 ug via INTRAMUSCULAR

## 2017-08-28 ENCOUNTER — Encounter: Payer: Self-pay | Admitting: Family Medicine

## 2017-08-28 ENCOUNTER — Ambulatory Visit (INDEPENDENT_AMBULATORY_CARE_PROVIDER_SITE_OTHER): Payer: Medicare Other

## 2017-08-28 DIAGNOSIS — E538 Deficiency of other specified B group vitamins: Secondary | ICD-10-CM | POA: Diagnosis not present

## 2017-08-28 MED ORDER — CYANOCOBALAMIN 1000 MCG/ML IJ SOLN
1000.0000 ug | Freq: Once | INTRAMUSCULAR | Status: AC
  Administered 2017-08-28: 1000 ug via INTRAMUSCULAR

## 2017-09-01 NOTE — Progress Notes (Signed)
b12 shot

## 2017-09-10 ENCOUNTER — Encounter: Payer: Self-pay | Admitting: Family Medicine

## 2017-09-10 ENCOUNTER — Ambulatory Visit: Payer: Medicare Other | Admitting: Family Medicine

## 2017-09-10 ENCOUNTER — Encounter (INDEPENDENT_AMBULATORY_CARE_PROVIDER_SITE_OTHER): Payer: Self-pay

## 2017-09-10 VITALS — BP 130/70 | HR 90 | Temp 98.1°F | Wt 183.0 lb

## 2017-09-10 DIAGNOSIS — R9389 Abnormal findings on diagnostic imaging of other specified body structures: Secondary | ICD-10-CM | POA: Diagnosis not present

## 2017-09-10 DIAGNOSIS — K8020 Calculus of gallbladder without cholecystitis without obstruction: Secondary | ICD-10-CM | POA: Insufficient documentation

## 2017-09-10 LAB — POC URINALSYSI DIPSTICK (AUTOMATED)
Blood, UA: NEGATIVE
GLUCOSE UA: NEGATIVE
Ketones, UA: NEGATIVE
Leukocytes, UA: NEGATIVE
NITRITE UA: NEGATIVE
PROTEIN UA: NEGATIVE
Spec Grav, UA: >=1.03 — AB (ref 1.010–1.025)
UROBILINOGEN UA: 0.2 U/dL
pH, UA: 6 (ref 5.0–8.0)

## 2017-09-10 NOTE — Addendum Note (Signed)
Addended by: Brenton Grills on: 10/31/3265 12:45 AM   Modules accepted: Orders

## 2017-09-10 NOTE — Patient Instructions (Addendum)
Urinalysis today.  We will schedule abdominal ultrasound for further evaluation of possible 1cm stone.  Increase water intake - for goal clear urine.

## 2017-09-10 NOTE — Progress Notes (Signed)
BP 130/70 (BP Location: Left Arm, Patient Position: Sitting, Cuff Size: Normal)   Pulse 90   Temp 98.1 F (36.7 C) (Oral)   Wt 183 lb (83 kg)   SpO2 97%   BMI 25.34 kg/m    CC: kidney vs gall stone Subjective:    Patient ID: Paul Atkinson, male    DOB: [birth year], 67 y.o.   MRN: [id number]  HPI: Paul Atkinson is a 67 y.o. male presenting on 09/10/2017 for Nephrolithiasis (Had x-ray done by Dr. Meda Coffee (rheumatology) and was told he has either a kidney stone or gall stone and suggested he see his PCP. Denies any sxs.   Pt provided x-ray (CD) and report.)   Dr Laurence Ferrari (derm) who he sees for dermatology referred him to rheum.   Recently saw rheum for chronic lower back pain, psoriasis, h/o crohns and iritis/uveitis, and positive HLA-B27. Lumbar spine xrays showed mod DDD at L4/5 and L5/S1 as well as 1cm calcification RUQ - ?kidney vs gallstone. No suggestive findings for axial spondyloarthropathy.   Denies any RUQ discomfort, denies any flank pain, blood in urine, no h/o kidney stones in the past.   On colestipol for chronic bile salt induced diarrhea (on this medicine remotely.   Relevant past medical, surgical, family and social history reviewed and updated as indicated. Interim medical history since our last visit reviewed. Allergies and medications reviewed and updated. Outpatient Medications Prior to Visit  Medication Sig Dispense Refill  . amLODipine (NORVASC) 5 MG tablet Take 1 tablet (5 mg total) by mouth daily. 30 tablet 6  . colestipol (COLESTID) 1 g tablet TAKE 4 TABLETS BY MOUTH EVERY DAY 120 tablet 3  . cyanocobalamin (,VITAMIN B-12,) 1000 MCG/ML injection Inject 1,000 mcg into the muscle every 30 (thirty) days. Chronic therapy    . fluticasone (FLONASE) 50 MCG/ACT nasal spray Place 1 spray into both nostrils daily as needed.     Marland Kitchen aspirin 81 MG tablet Take 81 mg by mouth daily.    . carvedilol (COREG) 3.125 MG tablet TAKE 1 TABLET (3.125 MG TOTAL) BY MOUTH 2 (TWO) TIMES  DAILY WITH A MEAL. 60 tablet 5   Facility-Administered Medications Prior to Visit  Medication Dose Route Frequency Provider Last Rate Last Dose  . 0.9 %  sodium chloride infusion  500 mL Intravenous Continuous Irene Shipper, MD         Per HPI unless specifically indicated in ROS section below Review of Systems     Objective:    BP 130/70 (BP Location: Left Arm, Patient Position: Sitting, Cuff Size: Normal)   Pulse 90   Temp 98.1 F (36.7 C) (Oral)   Wt 183 lb (83 kg)   SpO2 97%   BMI 25.34 kg/m   Wt Readings from Last 3 Encounters:  09/10/17 183 lb (83 kg)  03/18/17 183 lb 8 oz (83.2 kg)  03/12/17 178 lb (80.7 kg)    Physical Exam  Constitutional: He appears well-developed and well-nourished. No distress.  Abdominal: Soft. Normal appearance and bowel sounds are normal. He exhibits no distension and no mass. There is no hepatosplenomegaly. There is no tenderness. There is no rigidity, no rebound, no guarding, no CVA tenderness and negative Murphy's sign. No hernia.  Psychiatric: He has a normal mood and affect.  Nursing note and vitals reviewed.  Results for orders placed or performed in visit on 03/12/17  Lipid panel  Result Value Ref Range   Cholesterol 134 0 - 200 mg/dL  Triglycerides 174.0 (H) 0.0 - 149.0 mg/dL   HDL 56.60 >39.00 mg/dL   VLDL 34.8 0.0 - 40.0 mg/dL   LDL Cholesterol 43 0 - 99 mg/dL   Total CHOL/HDL Ratio 2    NonHDL 77.81   Comprehensive metabolic panel  Result Value Ref Range   Sodium 139 135 - 145 mEq/L   Potassium 4.4 3.5 - 5.1 mEq/L   Chloride 105 96 - 112 mEq/L   CO2 29 19 - 32 mEq/L   Glucose, Bld 101 (H) 70 - 99 mg/dL   BUN 14 6 - 23 mg/dL   Creatinine, Ser 0.91 0.40 - 1.50 mg/dL   Total Bilirubin 1.0 0.2 - 1.2 mg/dL   Alkaline Phosphatase 60 39 - 117 U/L   AST 16 0 - 37 U/L   ALT 11 0 - 53 U/L   Total Protein 6.7 6.0 - 8.3 g/dL   Albumin 4.2 3.5 - 5.2 g/dL   Calcium 9.3 8.4 - 10.5 mg/dL   GFR 88.27 >60.00 mL/min  Vitamin B12    Result Value Ref Range   Vitamin B-12 >1500 (H) 211 - 911 pg/mL  PSA  Result Value Ref Range   PSA 1.31 0.10 - 4.00 ng/mL  CBC with Differential/Platelet  Result Value Ref Range   WBC 5.0 4.0 - 10.5 K/uL   RBC 4.12 (L) 4.22 - 5.81 Mil/uL   Hemoglobin 12.9 (L) 13.0 - 17.0 g/dL   HCT 37.9 (L) 39.0 - 52.0 %   MCV 91.9 78.0 - 100.0 fl   MCHC 33.9 30.0 - 36.0 g/dL   RDW 13.5 11.5 - 15.5 %   Platelets 224.0 150.0 - 400.0 K/uL   Neutrophils Relative % 68.1 43.0 - 77.0 %   Lymphocytes Relative 17.0 12.0 - 46.0 %   Monocytes Relative 10.6 3.0 - 12.0 %   Eosinophils Relative 3.9 0.0 - 5.0 %   Basophils Relative 0.4 0.0 - 3.0 %   Neutro Abs 3.4 1.4 - 7.7 K/uL   Lymphs Abs 0.8 0.7 - 4.0 K/uL   Monocytes Absolute 0.5 0.1 - 1.0 K/uL   Eosinophils Absolute 0.2 0.0 - 0.7 K/uL   Basophils Absolute 0.0 0.0 - 0.1 K/uL      Assessment & Plan:   Problem List Items Addressed This Visit    Abnormal finding on imaging - Primary    Incidental finding of kidney vs gallstone - pt denies any symptoms of either. Check UA today to eval for hematuria.  Check abd [place] to further localize stone.  Reviewed symptoms of both.  Reviewed increased water intake in case this is kidney stone to avoid enlargement or new stone formation.       Relevant Orders   US Abdomen Complete       No orders of the defined types were placed in this encounter.  Orders Placed This Encounter  Procedures  . US Abdomen Complete    Standing Status:   Future    Standing Expiration Date:   11/11/2018    Order Specific Question:   Reason for Exam (SYMPTOM  OR DIAGNOSIS REQUIRED)    Answer:   eval R kidney vs gallstone on recent lumbar films    Order Specific Question:   Preferred imaging location?    Answer:   [facility]    Follow up plan: Return if symptoms worsen or fail to improve.  Ria Bush, MD

## 2017-09-10 NOTE — Assessment & Plan Note (Signed)
Incidental finding of kidney vs gallstone - pt denies any symptoms of either. Check UA today to eval for hematuria.  Check abd [place] to further localize stone.  Reviewed symptoms of both.  Reviewed increased water intake in case this is kidney stone to avoid enlargement or new stone formation.

## 2017-09-13 ENCOUNTER — Encounter: Payer: Self-pay | Admitting: Family Medicine

## 2017-09-13 ENCOUNTER — Ambulatory Visit
Admission: RE | Admit: 2017-09-13 | Discharge: 2017-09-13 | Disposition: A | Discharge status: Home / Self Care | Payer: Medicare Other | Source: Ambulatory Visit | Attending: Family Medicine | Admitting: Family Medicine

## 2017-09-13 DIAGNOSIS — K8020 Calculus of gallbladder without cholecystitis without obstruction: Secondary | ICD-10-CM | POA: Diagnosis not present

## 2017-09-13 DIAGNOSIS — R9389 Abnormal findings on diagnostic imaging of other specified body structures: Secondary | ICD-10-CM

## 2017-09-19 ENCOUNTER — Encounter: Payer: Self-pay | Admitting: Internal Medicine

## 2017-10-01 ENCOUNTER — Ambulatory Visit (INDEPENDENT_AMBULATORY_CARE_PROVIDER_SITE_OTHER): Payer: Medicare Other

## 2017-10-01 DIAGNOSIS — E538 Deficiency of other specified B group vitamins: Secondary | ICD-10-CM

## 2017-10-01 MED ORDER — CYANOCOBALAMIN 1000 MCG/ML IJ SOLN
1000.0000 ug | Freq: Once | INTRAMUSCULAR | Status: AC
  Administered 2017-10-01: 1000 ug via INTRAMUSCULAR

## 2017-10-12 ENCOUNTER — Other Ambulatory Visit: Payer: Self-pay | Admitting: Family Medicine

## 2017-10-24 ENCOUNTER — Telehealth: Payer: Self-pay | Admitting: Family Medicine

## 2017-10-24 MED ORDER — AZITHROMYCIN 250 MG PO TABS: ORAL_TABLET | ORAL | 0 refills | Status: DC

## 2017-10-24 NOTE — Telephone Encounter (Signed)
Spoke with pt notifying him zpack was sent to pharmacy.  Also, relayed instructions for use.  Pt verbalizes understanding.

## 2017-10-24 NOTE — Telephone Encounter (Signed)
plz notify zpack sent in to pharmacy. Only take if bad sore throat and fever >100.4.

## 2017-10-24 NOTE — Telephone Encounter (Signed)
Copied from [place]. Topic: Quick Communication - Rx Refill/Question >> Oct 24, 2017  1:16 PM Scherrie Gerlach wrote: Medication: onmi ceft/ amoxicillin Pt states his granddaughter has a very bad case of strep throat, strep A. [place] will be traveling to [place] this weekend, all together.  He is actually has her now.  Her pediatrician recommend he call his pcp and get this abx just in case he was to get symptoms.  (Dr suggested the whole family get) Pt states he is allergic to penicillin, and it has been a long time since he had to take anything. Pt hopes Dr Darnell Level will call this in for him.  Pt states he will not take unless he needs.  [id number] [phone](Phone) [phone](Fax)

## 2017-10-26 ENCOUNTER — Other Ambulatory Visit: Payer: Self-pay | Admitting: Internal Medicine

## 2017-11-05 ENCOUNTER — Ambulatory Visit (INDEPENDENT_AMBULATORY_CARE_PROVIDER_SITE_OTHER): Payer: Medicare Other

## 2017-11-05 DIAGNOSIS — E538 Deficiency of other specified B group vitamins: Secondary | ICD-10-CM

## 2017-11-05 MED ORDER — CYANOCOBALAMIN 1000 MCG/ML IJ SOLN
1000.0000 ug | Freq: Once | INTRAMUSCULAR | Status: AC
  Administered 2017-11-05: 1000 ug via INTRAMUSCULAR

## 2017-11-13 ENCOUNTER — Encounter: Payer: Self-pay | Admitting: Internal Medicine

## 2017-11-13 ENCOUNTER — Ambulatory Visit: Payer: Medicare Other | Admitting: Internal Medicine

## 2017-11-13 VITALS — BP 116/68 | HR 73 | Ht 72.0 in | Wt 181.0 lb

## 2017-11-13 DIAGNOSIS — K9089 Other intestinal malabsorption: Secondary | ICD-10-CM

## 2017-11-13 DIAGNOSIS — K5000 Crohn's disease of small intestine without complications: Secondary | ICD-10-CM

## 2017-11-13 MED ORDER — COLESTIPOL HCL 1 G PO TABS: 4.0000 g | ORAL_TABLET | Freq: Every day | ORAL | 3 refills | Status: DC

## 2017-11-13 NOTE — Progress Notes (Signed)
HISTORY OF PRESENT ILLNESS:  Paul Atkinson is a 67 y.o. male with a history of Crohn's ileocolitis for which he is status post ileocecectomy at [facility] greater than 20 years ago. He also has a history of postoperative bile salt induced diarrhea and B12 deficiency for which he is on Colestid and B12 replacement respectively. He presents today for annual follow-up. He was last seen February 2018 for routine surveillance colonoscopy. The examination was normal status post ileocecectomy except for nonspecific focal anastomotic ulceration. He has had no clinical problems since. He continues on Colestid for bile salt induced diarrhea and B12 replacement with his PCP.I was sorry to hear that his wife Enid Derry passed away from metastatic cancer. The patient states that he may be moving to [place]:  All non-GI ROS negative upon review  Past Medical History:  Diagnosis Date  . Anemia    iron def  . Bile salt-induced diarrhea   . Crohn disease ([place])    status post ileocecectomy 1989  . Disorder of inner ear   . Hearing loss   . Hx of duodenal ulcer   . Hx SBO   . Hypertension   . Malabsorption   . Psoriasis   . Ulcer   . Vitamin B 12 deficiency    secondary to ileal resection    Past Surgical History:  Procedure Laterality Date  . COLONOSCOPY  07/2016   patent anastomosis, no polyps, rpt 5 yrs Henrene Pastor)  . ILEOCECETOMY  1989   peritonitis with SBO from crohn's  . MOLE REMOVAL     back  . MOUTH SURGERY    . SEPTOPLASTY  05/2004   2nd to dev. septum (Dr. Myna Hidalgo)    Social History [name]  reports that he has never smoked. He has never used smokeless tobacco. He reports that he does not drink alcohol or use drugs.  family history includes CAD (age of onset: 59) in his father; Diabetes (age of onset: 79) in his father; Heart disease in his mother; Hyperlipidemia in his father; Hypertension in his mother; Kidney disease in his father and mother.  Allergies   Allergen Reactions  . Penicillins     REACTION: RASH in throat       PHYSICAL EXAMINATION: Vital signs: BP 116/68   Pulse 73   Ht 6' (1.829 m)   Wt 181 lb (82.1 kg)   BMI 24.55 kg/m   Constitutional: generally well-appearing, no acute distress Psychiatric: alert and oriented x3, cooperative Eyes: extraocular movements intact, anicteric, conjunctiva pink Mouth: oral pharynx moist, no lesions Neck: supple no lymphadenopathy Cardiovascular: heart regular rate and rhythm, no murmur Lungs: clear to auscultation bilaterally Abdomen: soft, nontender, nondistended, no obvious ascites, no peritonealigns. Prior surgical incisions well-healed,  normal bowel sounds, no organomegaly Rectal:omitted Extremities: no clubbing, cyanosis, or lower extremity edema bilaterally Skin: no lesions on visible extremities Neuro: No focal deficits. Cranial nerves intact  ASSESSMENT:  #1.Ileal Crohn's disease status post remote ileocecectomy. Last colonoscopy February 2018 as described #2. Bile salt induced diarrhea #3. B12 deficiency   PLAN:  #1. Continue Colestid. Prescription refilled #2. Continue B12 replacement with PCP #3. Routine office follow-up one year #4. Repeat surveillance colonoscopy around 2023

## 2017-11-13 NOTE — Patient Instructions (Signed)
If you are age 68 or older, your body mass index should be between 23-30. Your Body mass index is 24.55 kg/m. If this is out of the aforementioned range listed, please consider follow up with your Primary Care Provider.  If you are age 40 or younger, your body mass index should be between 19-25. Your Body mass index is 24.55 kg/m. If this is out of the aformentioned range listed, please consider follow up with your Primary Care Provider.   We have sent the following medications to your pharmacy for you to pick up at your convenience: Colestid  Follow up in 1 year.  Thank you for entrusting me with your care and for choosing [facility], Dr. Henrene Pastor

## 2017-11-15 ENCOUNTER — Telehealth: Payer: Self-pay | Admitting: Family Medicine

## 2017-11-15 NOTE — Telephone Encounter (Signed)
See 10/24/17 phone note.

## 2017-11-15 NOTE — Telephone Encounter (Signed)
Copied from [place] [id number]. Topic: Quick Communication - See Telephone Encounter >> Nov 15, 2017  2:17 PM Neva Seat wrote: Pt is wanting to know if he needs to take the Zpack Dr. Darnell Level. Prescribed him.  Pt says his relatives are not showing symptoms but they are showing positive for strep. He wants to know if he may need to come in to be tested before taking the medication or just go ahead w/ taking it even though he doesn't have any symptoms.

## 2017-11-15 NOTE — Telephone Encounter (Signed)
Spoke with pt relaying Dr. Synthia Innocent message and instructions [see TE, 10/24/17].  Pt states he is not having any sxs but grandchildren keep testing positive for strep.  He just wants to know if he could be a carrier for strep and not know it.  And if so, should he go ahead and take the zpack.  Says he's not in a hurry for an answer. Can wait until next week for response.

## 2017-11-15 NOTE — Telephone Encounter (Signed)
Copied from [phone]. Topic: Quick Communication - See Telephone Encounter >> Nov 15, 2017  2:22 PM Neva Seat wrote:  Pt is wanting to know if he needs to take the Zpack Dr. Darnell Level. Prescribed him. Pt says his relatives are not showing symptoms but they are showing positive for strep. He wants to know if he may need to come in to be tested before taking the medication or just go ahead w/ taking it even though he doesn't have any symptoms.

## 2017-11-15 NOTE — Telephone Encounter (Signed)
Spoke with pt relaying Dr. G's message. Pt verbalizes understanding.  

## 2017-11-15 NOTE — Telephone Encounter (Signed)
If no symptoms, don't take antibiotic. If concern for carrier state, recommend come in for testing. We can discuss then.

## 2017-11-16 NOTE — Progress Notes (Signed)
B12 shot given.

## 2017-11-18 ENCOUNTER — Encounter: Payer: Self-pay | Admitting: Family Medicine

## 2017-12-10 ENCOUNTER — Ambulatory Visit: Payer: Medicare Other

## 2017-12-10 ENCOUNTER — Ambulatory Visit (INDEPENDENT_AMBULATORY_CARE_PROVIDER_SITE_OTHER): Payer: Medicare Other | Admitting: *Deleted

## 2017-12-10 ENCOUNTER — Encounter: Payer: Self-pay | Admitting: *Deleted

## 2017-12-10 DIAGNOSIS — E538 Deficiency of other specified B group vitamins: Secondary | ICD-10-CM

## 2017-12-10 MED ORDER — CYANOCOBALAMIN 1000 MCG/ML IJ SOLN
1000.0000 ug | Freq: Once | INTRAMUSCULAR | Status: AC
  Administered 2017-12-10: 1000 ug via INTRAMUSCULAR

## 2017-12-10 NOTE — Progress Notes (Signed)
Per orders of Dr.Gutierrez, injection of Vitamin B12 given by Lauralyn Primes.  Patient tolerated injection well.

## 2017-12-12 ENCOUNTER — Ambulatory Visit: Payer: Medicare Other

## 2018-01-05 IMAGING — US US THYROID
1 series · 14 of 25 positions shown · non-contrast
Comparison: None.

CLINICAL DATA: Palpable thyroid nodule by exam

EXAM:
THYROID ULTRASOUND
TECHNIQUE: Ultrasound examination of the thyroid gland and adjacent soft
tissues was performed.

[Series 1: us thyroid · 0.07mm/px · 14 of 33 slices shown]
[im 1/33]
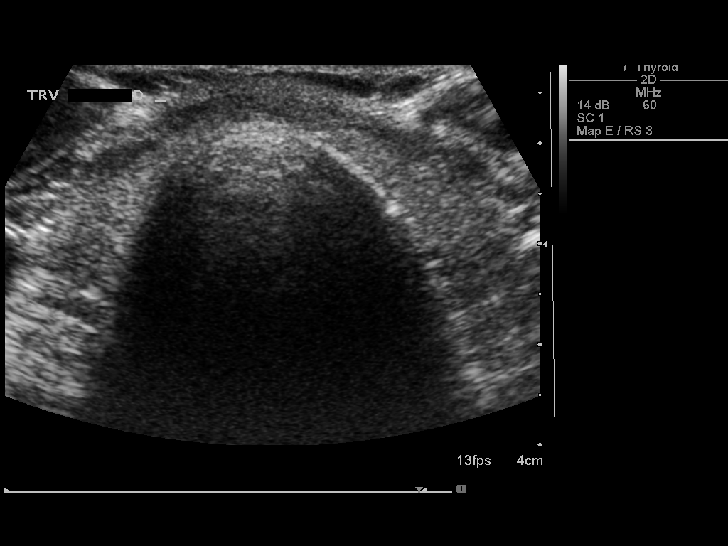
[im 3/33]
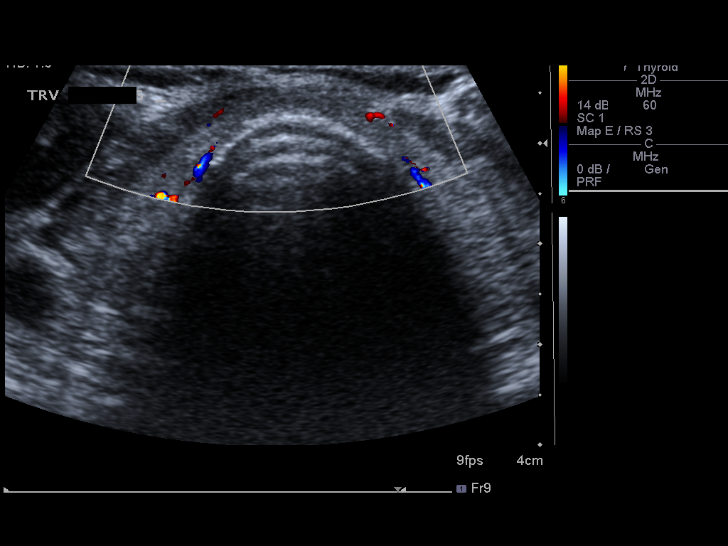
[im 6/33]
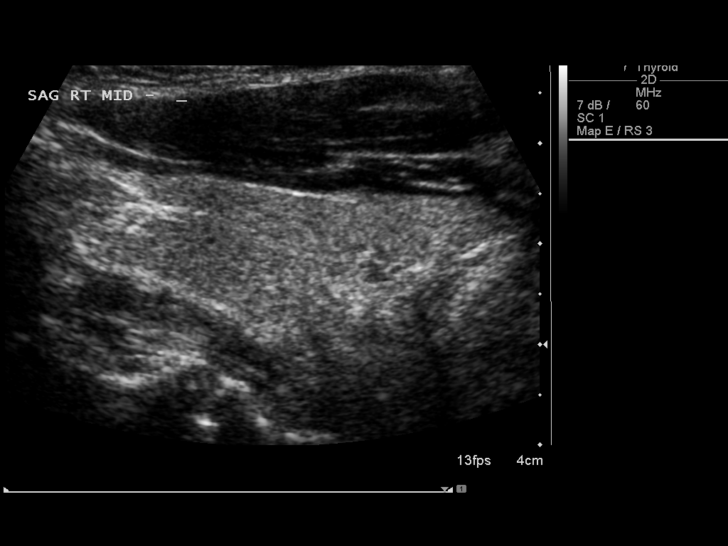
[im 9/33]
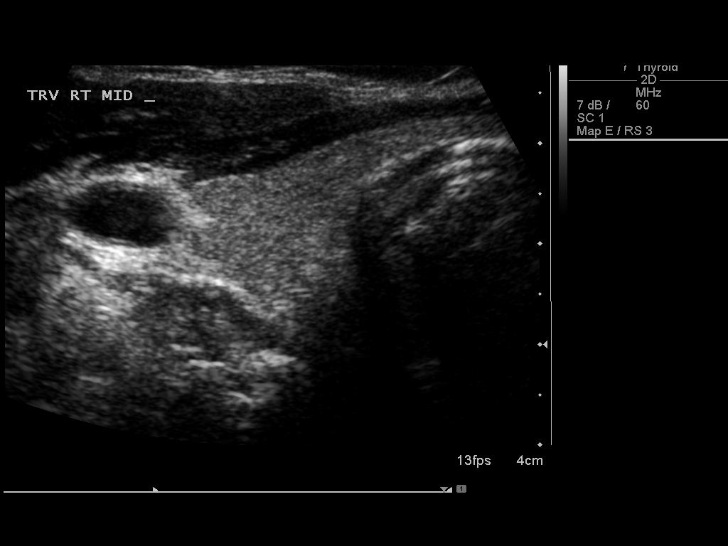
[im 11/33]
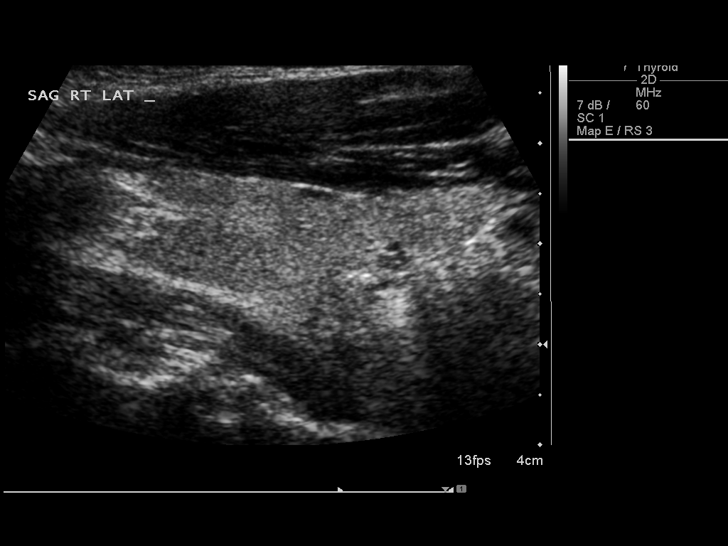
[im 13/33]
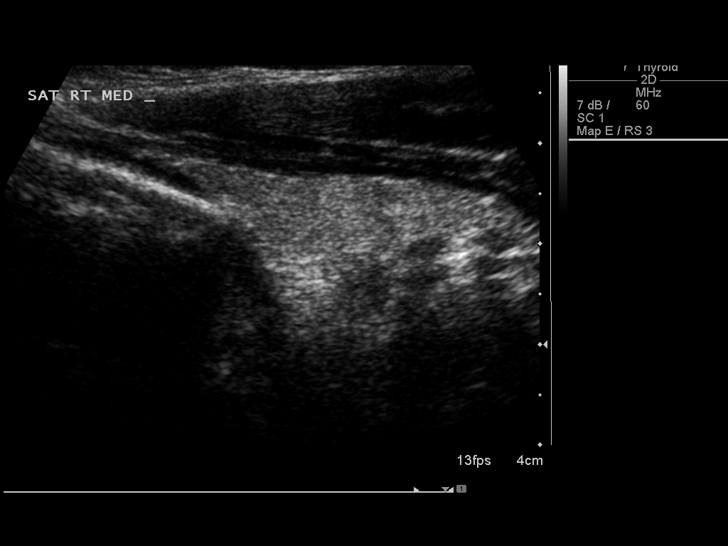
[im 15/33]
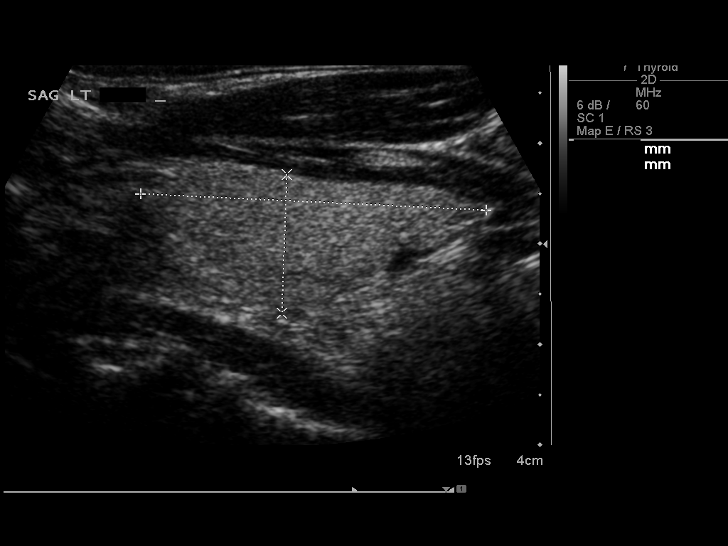
[im 18/33]
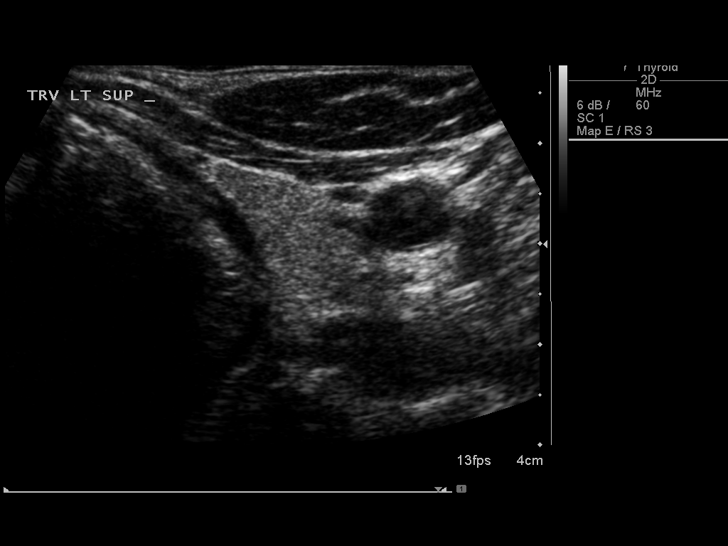
[im 21/33]
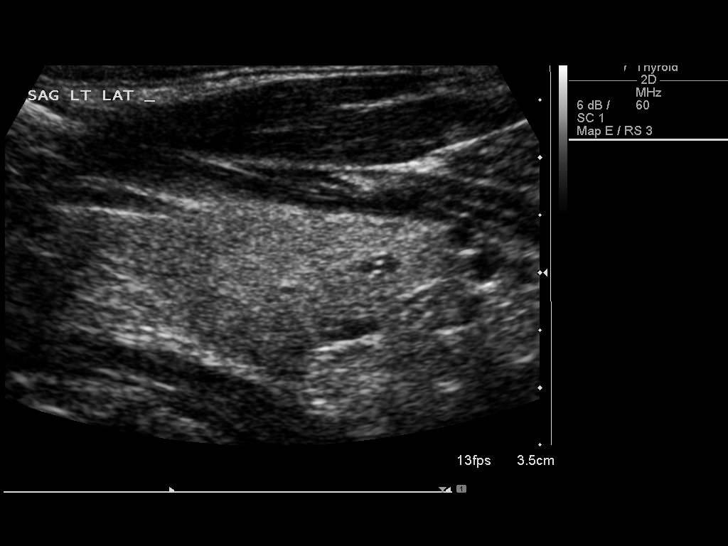
[im 22/33]
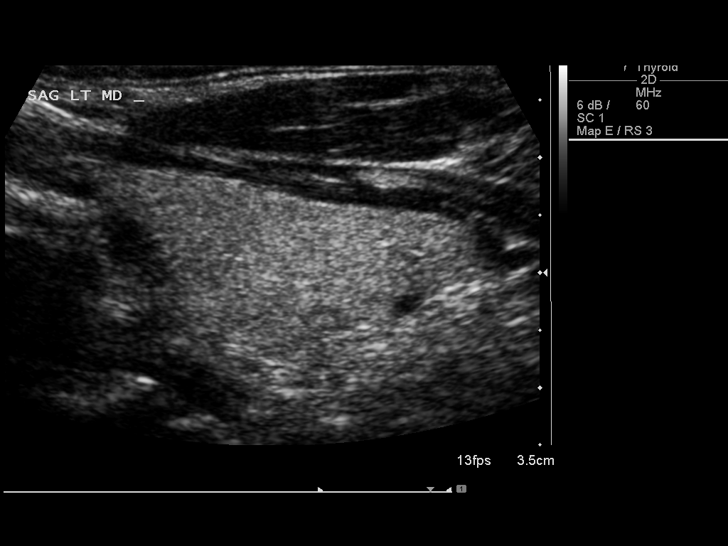
[im 25/33]
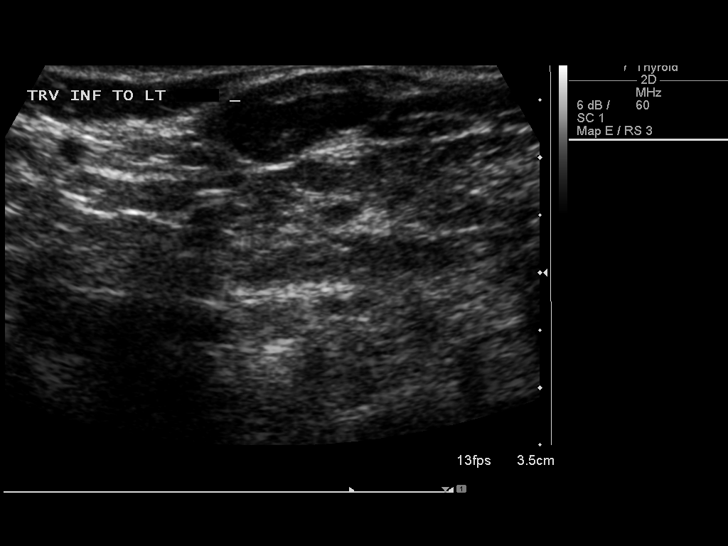
[im 27/33]
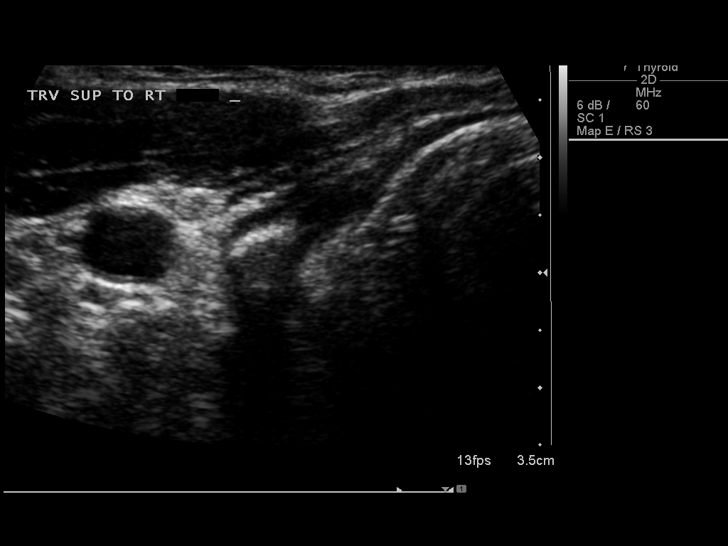
[im 30/33]
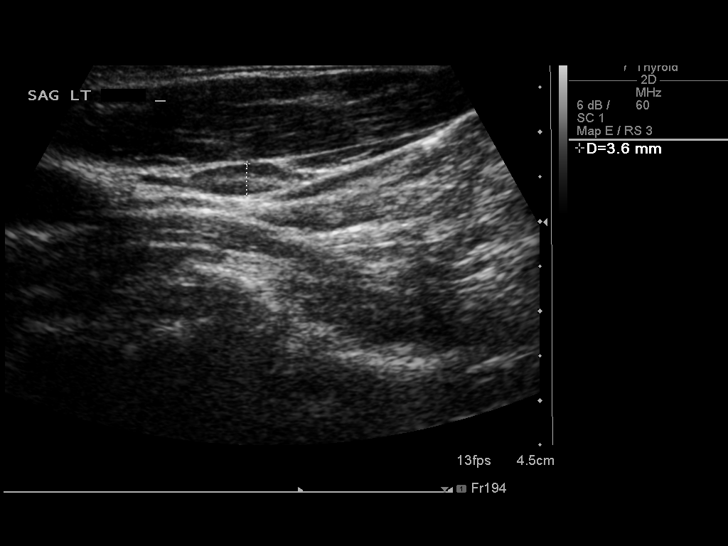
[im 33/33]
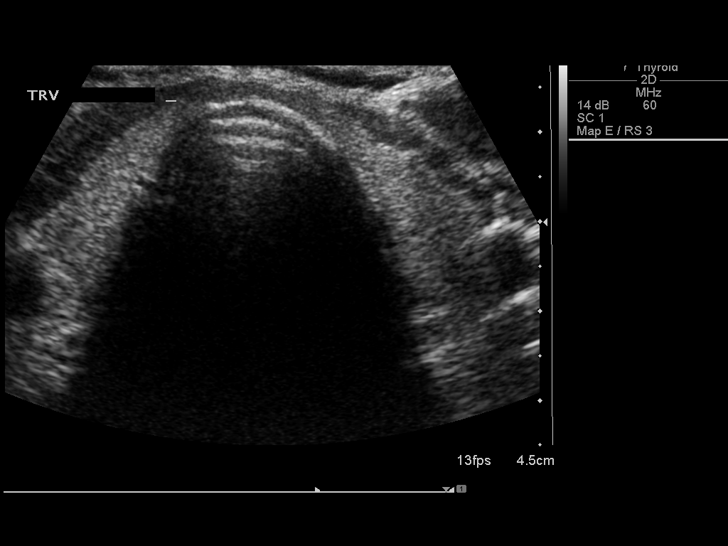

[14 of 25 positions shown; findings below may reference images not displayed]

FINDINGS: Right thyroid lobe

Measurements: 3.8 x 1.6 x 1.7 cm.  No nodules visualized.

Left thyroid lobe

Measurements: 3.4 x 1.4 x 1.4 cm.  No nodules visualized.

Isthmus

Thickness: 2.5 mm.  No nodules visualized.

Lymphadenopathy

None visualized.
IMPRESSION: Normal thyroid ultrasound for age.  No discrete abnormality.

## 2018-01-14 ENCOUNTER — Ambulatory Visit (INDEPENDENT_AMBULATORY_CARE_PROVIDER_SITE_OTHER): Payer: Medicare Other

## 2018-01-14 DIAGNOSIS — E538 Deficiency of other specified B group vitamins: Secondary | ICD-10-CM | POA: Diagnosis not present

## 2018-01-14 MED ORDER — CYANOCOBALAMIN 1000 MCG/ML IJ SOLN
1000.0000 ug | Freq: Once | INTRAMUSCULAR | Status: AC
  Administered 2018-01-14: 1000 ug via INTRAMUSCULAR

## 2018-01-14 NOTE — Progress Notes (Signed)
Per orders of Dr. Damita Dunnings, injection of Vit B 12 given by Ozzie Hoyle. Patient tolerated injection well.

## 2018-02-10 ENCOUNTER — Other Ambulatory Visit: Payer: Self-pay | Admitting: Family Medicine

## 2018-02-18 ENCOUNTER — Ambulatory Visit (INDEPENDENT_AMBULATORY_CARE_PROVIDER_SITE_OTHER): Payer: Medicare Other | Admitting: *Deleted

## 2018-02-18 DIAGNOSIS — E538 Deficiency of other specified B group vitamins: Secondary | ICD-10-CM | POA: Diagnosis not present

## 2018-02-18 MED ORDER — CYANOCOBALAMIN 1000 MCG/ML IJ SOLN
1000.0000 ug | Freq: Once | INTRAMUSCULAR | Status: AC
  Administered 2018-02-18: 1000 ug via INTRAMUSCULAR

## 2018-03-13 ENCOUNTER — Ambulatory Visit (INDEPENDENT_AMBULATORY_CARE_PROVIDER_SITE_OTHER): Payer: Medicare Other

## 2018-03-13 ENCOUNTER — Ambulatory Visit: Payer: Medicare Other

## 2018-03-13 VITALS — BP 118/76 | HR 65 | Temp 98.0°F | Ht 72.5 in | Wt 184.5 lb

## 2018-03-13 DIAGNOSIS — Z0000 Encounter for general adult medical examination without abnormal findings: Secondary | ICD-10-CM | POA: Diagnosis not present

## 2018-03-13 DIAGNOSIS — E785 Hyperlipidemia, unspecified: Secondary | ICD-10-CM | POA: Diagnosis not present

## 2018-03-13 DIAGNOSIS — Z125 Encounter for screening for malignant neoplasm of prostate: Secondary | ICD-10-CM

## 2018-03-13 DIAGNOSIS — I10 Essential (primary) hypertension: Secondary | ICD-10-CM | POA: Diagnosis not present

## 2018-03-13 DIAGNOSIS — D518 Other vitamin B12 deficiency anemias: Secondary | ICD-10-CM

## 2018-03-13 DIAGNOSIS — Z23 Encounter for immunization: Secondary | ICD-10-CM

## 2018-03-13 LAB — CBC WITH DIFFERENTIAL/PLATELET
Basophils Absolute: 0 10*3/uL (ref 0.0–0.1)
Basophils Relative: 0.9 % (ref 0.0–3.0)
EOS PCT: 4.4 % (ref 0.0–5.0)
Eosinophils Absolute: 0.2 10*3/uL (ref 0.0–0.7)
HCT: 36.3 % — ABNORMAL LOW (ref 39.0–52.0)
Hemoglobin: 12.1 g/dL — ABNORMAL LOW (ref 13.0–17.0)
LYMPHS ABS: 0.8 10*3/uL (ref 0.7–4.0)
LYMPHS PCT: 17.2 % (ref 12.0–46.0)
MCHC: 33.3 g/dL (ref 30.0–36.0)
MCV: 84.6 fl (ref 78.0–100.0)
MONOS PCT: 13.1 % — AB (ref 3.0–12.0)
Monocytes Absolute: 0.6 10*3/uL (ref 0.1–1.0)
NEUTROS ABS: 2.9 10*3/uL (ref 1.4–7.7)
Neutrophils Relative %: 64.4 % (ref 43.0–77.0)
PLATELETS: 221 10*3/uL (ref 150.0–400.0)
RBC: 4.29 Mil/uL (ref 4.22–5.81)
RDW: 15.5 % (ref 11.5–15.5)
WBC: 4.4 10*3/uL (ref 4.0–10.5)

## 2018-03-13 LAB — PSA: PSA: 1.75 ng/mL (ref 0.10–4.00)

## 2018-03-13 LAB — COMPREHENSIVE METABOLIC PANEL
ALBUMIN: 4.3 g/dL (ref 3.5–5.2)
ALK PHOS: 54 U/L (ref 39–117)
ALT: 12 U/L (ref 0–53)
AST: 16 U/L (ref 0–37)
BUN: 13 mg/dL (ref 6–23)
CO2: 26 mEq/L (ref 19–32)
Calcium: 9 mg/dL (ref 8.4–10.5)
Chloride: 106 mEq/L (ref 96–112)
Creatinine, Ser: 1.02 mg/dL (ref 0.40–1.50)
GFR: 77.14 mL/min (ref >60.00)
Glucose, Bld: 114 mg/dL — ABNORMAL HIGH (ref 70–99)
POTASSIUM: 4.7 meq/L (ref 3.5–5.1)
Sodium: 140 mEq/L (ref 135–145)
TOTAL PROTEIN: 6.8 g/dL (ref 6.0–8.3)
Total Bilirubin: 0.7 mg/dL (ref 0.2–1.2)

## 2018-03-13 LAB — LIPID PANEL
CHOLESTEROL: 119 mg/dL (ref 0–200)
HDL: 40.3 mg/dL (ref >39.00)
LDL Cholesterol: 43 mg/dL (ref 0–99)
NonHDL: 78.52
Total CHOL/HDL Ratio: 3
Triglycerides: 177 mg/dL — ABNORMAL HIGH (ref 0.0–149.0)
VLDL: 35.4 mg/dL (ref 0.0–40.0)

## 2018-03-13 LAB — VITAMIN B12: Vitamin B-12: 338 pg/mL (ref 211–911)

## 2018-03-13 NOTE — Patient Instructions (Signed)
Paul Atkinson , Thank you for taking time to come for your Medicare Wellness Visit. I appreciate your ongoing commitment to your health goals. Please review the following plan we discussed and let me know if I can assist you in the future.   These are the goals we discussed: Goals    . Eat more fruits and vegetables     Starting 03/13/2018, I will continue to eat at least 4-5 servings of vegetables daily.        This is a list of the screening recommended for you and due dates:  Health Maintenance  Topic Date Due  . DTaP/Tdap/Td vaccine (1 - Tdap) 08/29/2020*  . Tetanus Vaccine  08/29/2020  . Colon Cancer Screening  08/10/2021  . Flu Shot  Completed  .  Hepatitis C: One time screening is recommended by Center for Disease Control  (CDC) for  adults born from [birth year] through 1966.   Completed  . Pneumonia vaccines  Completed  *Topic was postponed. The date shown is not the original due date.   Preventive Care for Adults  A healthy lifestyle and preventive care can promote health and wellness. Preventive health guidelines for adults include the following key practices.  . A routine yearly physical is a good way to check with your health care provider about your health and preventive screening. It is a chance to share any concerns and updates on your health and to receive a thorough exam.  . Visit your dentist for a routine exam and preventive care every 6 months. Brush your teeth twice a day and floss once a day. Good oral hygiene prevents tooth decay and gum disease.  . The frequency of eye exams is based on your age, health, family medical history, use  of contact lenses, and other factors. Follow your health care provider's recommendations for frequency of eye exams.  . Eat a healthy diet. Foods like vegetables, fruits, whole grains, low-fat dairy products, and lean protein foods contain the nutrients you need without too many calories. Decrease your intake of foods high in solid fats,  added sugars, and salt. Eat the right amount of calories for you. Get information about a proper diet from your health care provider, if necessary.  . Regular physical exercise is one of the most important things you can do for your health. Most adults should get at least 150 minutes of moderate-intensity exercise (any activity that increases your heart rate and causes you to sweat) each week. In addition, most adults need muscle-strengthening exercises on 2 or more days a week.  Silver Sneakers may be a benefit available to you. To determine eligibility, you may visit the website: www.silversneakers.com or contact program at 8651358511 Mon-Fri between 8AM-8PM.   . Maintain a healthy weight. The body mass index (BMI) is a screening tool to identify possible weight problems. It provides an estimate of body fat based on height and weight. Your health care provider can find your BMI and can help you achieve or maintain a healthy weight.   For adults 20 years and older: ? A BMI below 18.5 is considered underweight. ? A BMI of 18.5 to 24.9 is normal. ? A BMI of 25 to 29.9 is considered overweight. ? A BMI of 30 and above is considered obese.   . Maintain normal blood lipids and cholesterol levels by exercising and minimizing your intake of saturated fat. Eat a balanced diet with plenty of fruit and vegetables. Blood tests for lipids and cholesterol should begin  at age 21 and be repeated every 5 years. If your lipid or cholesterol levels are high, you are over 50, or you are at high risk for heart disease, you may need your cholesterol levels checked more frequently. Ongoing high lipid and cholesterol levels should be treated with medicines if diet and exercise are not working.  . If you smoke, find out from your health care provider how to quit. If you do not use tobacco, please do not start.  . If you choose to drink alcohol, please do not consume more than 2 drinks per day. One drink is  considered to be 12 ounces (355 mL) of beer, 5 ounces (148 mL) of wine, or 1.5 ounces (44 mL) of liquor.  . If you are 78-55 years old, ask your health care provider if you should take aspirin to prevent strokes.  . Use sunscreen. Apply sunscreen liberally and repeatedly throughout the day. You should seek shade when your shadow is shorter than you. Protect yourself by wearing long sleeves, pants, a wide-brimmed hat, and sunglasses year round, whenever you are outdoors.  . Once a month, do a whole body skin exam, using a mirror to look at the skin on your back. Tell your health care provider of new moles, moles that have irregular borders, moles that are larger than a pencil eraser, or moles that have changed in shape or color.

## 2018-03-16 NOTE — Progress Notes (Signed)
PCP notes:   Health maintenance:  Flu vaccine - administered  Abnormal screenings:   Hearing - failed  Hearing Screening   125Hz  250Hz  500Hz  1000Hz  2000Hz  3000Hz  4000Hz  6000Hz  8000Hz   Right ear:   40 0 40  0    Left ear:   0 40 40  0     Patient concerns:   None  Nurse concerns:  None  Next PCP appt:   03/19/18 @ 1030

## 2018-03-16 NOTE — Progress Notes (Signed)
Subjective:   Paul Atkinson is a 67 y.o. male who presents for Medicare Annual/Subsequent preventive examination.  Review of Systems:  N/A Cardiac Risk Factors include: advanced age (>34mn, >>55women);dyslipidemia;hypertension;male gender     Objective:    Vitals: BP 118/76 (BP Location: Right Arm, Patient Position: Sitting, Cuff Size: Normal)   Pulse 65   Temp 98 F (36.7 C) (Oral)   Ht 6' 0.5" (1.842 m) Comment: shoes  Wt 184 lb 8 oz (83.7 kg)   SpO2 97%   BMI 24.68 kg/m   Body mass index is 24.68 kg/m.  Advanced Directives 03/13/2018 03/12/2017  Does Patient Have a Medical Advance Directive? Yes Yes  Type of [place] will  Does patient want to make changes to medical advance directive? - Yes (MAU/Ambulatory/Procedural Areas - Information given)  Copy of [place] Chart? No - copy requested No - copy requested    Tobacco Social History   Tobacco Use  Smoking Status Never Smoker  Smokeless Tobacco Never Used     Counseling given: No   Clinical Intake:  Pre-visit preparation completed: Yes  Pain Score: 4      Nutritional Status: BMI of 19-24  Normal Nutritional Risks: None Diabetes: No  How often do you need to have someone help you when you read instructions, pamphlets, or other written materials from your doctor or pharmacy?: 1 - Never What is the last grade level you completed in school?: Bachelor degree  Interpreter Needed?: No  Comments: pt is a widower and lives alone Information entered by :: LPinson, LPN  Past Medical History:  Diagnosis Date  . Anemia    iron def  . Bile salt-induced diarrhea   . [place]    status post ileocecectomy 1989  . Disorder of inner ear   . Hearing loss   . Hx of duodenal ulcer   . Hx SBO   . Hypertension   . Malabsorption   . Psoriasis   . Ulcer   . Vitamin B 12 deficiency    secondary  to ileal resection   Past Surgical History:  Procedure Laterality Date  . COLONOSCOPY  07/2016   patent anastomosis, no polyps, rpt 5 yrs (Henrene Pastor  . ILEOCECETOMY  1989   peritonitis with SBO from crohn's  . MOLE REMOVAL     back  . MOUTH SURGERY    . SEPTOPLASTY  05/2004   2nd to dev. septum (Dr. JMyna Hidalgo   Family History  Problem Relation Age of Onset  . Heart disease Mother        CHF, arrhythmias  . Kidney disease Mother        kidney failure  . Hypertension Mother   . Kidney disease Father        renal failure  . CAD Father 825      MI stents  . Diabetes Father 80       on insulin  . Hyperlipidemia Father   . Cancer Neg Hx   . Colon cancer Neg Hx    Social History   Socioeconomic History  . Marital status: Widowed    Spouse name: Not on file  . Number of children: 2  . Years of education: Not on file  . Highest education level: Not on file  Occupational History  . Occupation: AGENT-retired    Employer: [employer]    Comment: Insurance  Social Needs  .  Financial resource strain: Not on file  . Food insecurity:    Worry: Not on file    Inability: Not on file  . Transportation needs:    Medical: Not on file    Non-medical: Not on file  Tobacco Use  . Smoking status: Never Smoker  . Smokeless tobacco: Never Used  Substance and Sexual Activity  . Alcohol use: No    Alcohol/week: 0.0 standard drinks    Comment: very rarely  . Drug use: No  . Sexual activity: Yes  Lifestyle  . Physical activity:    Days per week: Not on file    Minutes per session: Not on file  . Stress: Not on file  Relationships  . Social connections:    Talks on phone: Not on file    Gets together: Not on file    Attends religious service: Not on file    Active member of club or organization: Not on file    Attends meetings of clubs or organizations: Not on file    Relationship status: Not on file  Other Topics Concern  . Not on file  Social History Narrative    occasional caffeine   Widower - wife [name] passed away from metastatic cancer 2019   Occupation: retired, Medical illustrator   Activity: no regular exercise but stays active   Diet: good water, fruits/vegetables some    Outpatient Encounter Medications as of 03/13/2018  Medication Sig  . amLODipine (NORVASC) 5 MG tablet TAKE 1 TABLET BY MOUTH EVERY DAY  . colestipol (COLESTID) 1 g tablet Take 4 tablets (4 g total) by mouth daily.  . cyanocobalamin (,VITAMIN B-12,) 1000 MCG/ML injection Inject 1,000 mcg into the muscle every 30 (thirty) days. Chronic therapy  . fluticasone (FLONASE) 50 MCG/ACT nasal spray Place 1 spray into both nostrils daily.    Facility-Administered Encounter Medications as of 03/13/2018  Medication  . 0.9 %  sodium chloride infusion    Activities of Daily Living In your present state of health, do you have any difficulty performing the following activities: 03/13/2018  Hearing? Y  Vision? N  Difficulty concentrating or making decisions? N  Walking or climbing stairs? N  Dressing or bathing? N  Doing errands, shopping? N  Preparing Food and eating ? N  Using the Toilet? N  In the past six months, have you accidently leaked urine? N  Do you have problems with loss of bowel control? N  Managing your Medications? N  Managing your Finances? N  Housekeeping or managing your Housekeeping? N  Some recent data might be hidden    Patient Care Team: Ria Bush, MD as PCP - General (Family Medicine)   Assessment:   This is a routine wellness examination for [facility].   Hearing Screening   125Hz  250Hz  500Hz  1000Hz  2000Hz  3000Hz  4000Hz  6000Hz  8000Hz   Right ear:   40 0 40  0    Left ear:   0 40 40  0      Visual Acuity Screening   Right eye Left eye Both eyes  Without correction: 20/25-1 20/25 20/20-2  With correction:       Exercise Activities and Dietary recommendations Current Exercise Habits: The patient does not participate in regular exercise at  present(gardening, yard work), Exercise limited by: None identified  Goals    . Eat more fruits and vegetables     Starting 03/13/2018, I will continue to eat at least 4-5 servings of vegetables daily.  Fall Risk Fall Risk  03/13/2018 03/12/2017 10/04/2015  Falls in the past year? No No No   Depression Screen PHQ 2/9 Scores 03/13/2018 03/12/2017 12/12/2016 11/07/2016  PHQ - 2 Score 0 0 2 2  PHQ- 9 Score 0 1 2 11     Cognitive Function MMSE - Mini Mental State Exam 03/13/2018 03/12/2017  Orientation to time 5 5  Orientation to Place 5 5  Registration 3 3  Attention/ Calculation 0 0  Recall 3 3  Language- name 2 objects 0 0  Language- repeat 1 1  Language- follow 3 step command 3 3  Language- read & follow direction 0 0  Write a sentence 0 0  Copy design 0 0  Total score 20 20     PLEASE NOTE: A Mini-Cog screen was completed. Maximum score is 20. A value of 0 denotes this part of Folstein MMSE was not completed or the patient failed this part of the Mini-Cog screening.   Mini-Cog Screening Orientation to Time - Max 5 pts Orientation to Place - Max 5 pts Registration - Max 3 pts Recall - Max 3 pts Language Repeat - Max 1 pts Language Follow 3 Step Command - Max 3 pts     Immunization History  Administered Date(s) Administered  . H1N1 07/01/2008  . Influenza Split 03/29/2011, 04/25/2012  . Influenza Whole 03/26/2006, 03/28/2007, 03/10/2008, 05/26/2009, 03/23/2010  . Influenza, Seasonal, Injecte, Preservative Fre 04/26/2014  . Influenza,inj,Quad PF,6+ Mos 04/27/2015, 04/24/2016, 03/12/2017, 03/13/2018  . Influenza-Unspecified 03/25/2014  . Pneumococcal Conjugate-13 10/04/2015  . Pneumococcal Polysaccharide-23 03/12/2017  . Td 11/07/2005, 08/30/2010  . Zoster 08/30/2010   Screening Tests Health Maintenance  Topic Date Due  . DTaP/Tdap/Td (1 - Tdap) 08/29/2020 (Originally 08/31/2010)  . TETANUS/TDAP  08/29/2020  . COLONOSCOPY  08/10/2021  . INFLUENZA VACCINE   Completed  . Hepatitis C Screening  Completed  . PNA vac Low Risk Adult  Completed      Plan:     I have personally reviewed, addressed, and noted the following in the patient's chart:  A. Medical and social history B. Use of alcohol, tobacco or illicit drugs  C. Current medications and supplements D. Functional ability and status E.  Nutritional status F.  Physical activity G. Advance directives H. List of other physicians I.  Hospitalizations, surgeries, and ER visits in previous 12 months J.  [place] to include hearing, vision, cognitive, depression L. Referrals and appointments - none  In addition, I have reviewed and discussed with patient certain preventive protocols, quality metrics, and best practice recommendations. A written personalized care plan for preventive services as well as general preventive health recommendations were provided to patient.  See attached scanned questionnaire for additional information.   Signed,   Lindell Noe, MHA, BS, LPN Health Coach

## 2018-03-19 ENCOUNTER — Encounter: Payer: Self-pay | Admitting: Family Medicine

## 2018-03-19 ENCOUNTER — Ambulatory Visit (INDEPENDENT_AMBULATORY_CARE_PROVIDER_SITE_OTHER): Payer: Medicare Other | Admitting: Family Medicine

## 2018-03-19 VITALS — BP 120/78 | HR 81 | Temp 98.2°F | Ht 72.5 in | Wt 183.8 lb

## 2018-03-19 DIAGNOSIS — Z0000 Encounter for general adult medical examination without abnormal findings: Secondary | ICD-10-CM

## 2018-03-19 DIAGNOSIS — K8020 Calculus of gallbladder without cholecystitis without obstruction: Secondary | ICD-10-CM

## 2018-03-19 DIAGNOSIS — L409 Psoriasis, unspecified: Secondary | ICD-10-CM | POA: Diagnosis not present

## 2018-03-19 DIAGNOSIS — E538 Deficiency of other specified B group vitamins: Secondary | ICD-10-CM | POA: Diagnosis not present

## 2018-03-19 DIAGNOSIS — I10 Essential (primary) hypertension: Secondary | ICD-10-CM

## 2018-03-19 DIAGNOSIS — R7989 Other specified abnormal findings of blood chemistry: Secondary | ICD-10-CM

## 2018-03-19 DIAGNOSIS — K9089 Other intestinal malabsorption: Secondary | ICD-10-CM

## 2018-03-19 DIAGNOSIS — K5080 Crohn's disease of both small and large intestine without complications: Secondary | ICD-10-CM

## 2018-03-19 DIAGNOSIS — E785 Hyperlipidemia, unspecified: Secondary | ICD-10-CM

## 2018-03-19 DIAGNOSIS — Z7189 Other specified counseling: Secondary | ICD-10-CM | POA: Diagnosis not present

## 2018-03-19 MED ORDER — B-12 1000 MCG SL SUBL: 1.0000 | SUBLINGUAL_TABLET | Freq: Every day | SUBLINGUAL | Status: DC

## 2018-03-19 MED ORDER — DOCUSATE SODIUM 100 MG PO CAPS: 100.0000 mg | ORAL_CAPSULE | Freq: Every day | ORAL | 0 refills | Status: AC

## 2018-03-19 MED ORDER — AMLODIPINE BESYLATE 5 MG PO TABS: 5.0000 mg | ORAL_TABLET | Freq: Every day | ORAL | 3 refills | Status: DC

## 2018-03-19 NOTE — Assessment & Plan Note (Signed)
Chronic, followed by derm. Has UVB light box at home.

## 2018-03-19 NOTE — Assessment & Plan Note (Signed)
Stable period.

## 2018-03-19 NOTE — Assessment & Plan Note (Addendum)
Advanced directives: has at home. HCPOA is daughter. Will bring copy.

## 2018-03-19 NOTE — Assessment & Plan Note (Signed)
On colestipol.

## 2018-03-19 NOTE — Assessment & Plan Note (Signed)
Chronic, stable. Continue current regimen. 

## 2018-03-19 NOTE — Progress Notes (Signed)
BP 120/78 (BP Location: Left Arm, Patient Position: Sitting, Cuff Size: Normal)   Pulse 81   Temp 98.2 F (36.8 C) (Oral)   Ht 6' 0.5" (1.842 m)   Wt 183 lb 12 oz (83.3 kg)   SpO2 95%   BMI 24.58 kg/m    CC: CPE Subjective:    Patient ID: Paul Atkinson, male    DOB: 01-13-1951, 67 y.o.   MRN: [id number]  HPI: Paul Atkinson is a 67 y.o. male presenting on 03/19/2018 for Annual Exam (Pt 2.)   Lost wife earlier this year 07/2017 due to colon cancer.   Saw Paul Atkinson last week for medicare wellness visit. Note reviewed.    H/o crohn's ileocolitis s/p ilocecectomy remotely - receives b12 shots monthly. Persistently low levels despite this.  Bile salt induced diarrhea - on colestipol.  Has seen rheumatologist - referred by derm for further eval of chronic psoriasis. Tested negative for psoriatic arthritis. Intermittent lower back pain managed with minimal tylenol and advil.  Ganglion cyst R dorsal wrist  Preventative: Colonoscopy - 07/2016, stable.patent anastomosis. Rec rpt 5 years (Dr. Henrene Pastor) Prostate cancer screening - minor sxs, attributed to enlarged prostate.Nocturia x1.Previously agreed to stop screening for now as long as PSA remains stable.  Flu shot yearly Pneumovax 02/2017, prevnar 09/2015  Td 2012  zostavax 2012 shingrix - discussed Advanced directives: has at home. HCPOA is daughter. Will bring copy.  Discussed seat belt use. Discussed sunscreen use. No changing moles on skin. Sees derm yearly.  Non smoker  Alcohol - none Dentist Q6 mo Eye exam - due  Occasional caffeine  Lives with wife, no pets  Occupation: retired, Medical illustrator, part-time handyman  Activity: no regular exercise but stays active  Diet: good water, fruits/vegetables some   Relevant past medical, surgical, family and social history reviewed and updated as indicated. Interim medical history since our last visit reviewed. Allergies and medications reviewed and updated. Outpatient  Medications Prior to Visit  Medication Sig Dispense Refill  . colestipol (COLESTID) 1 g tablet Take 4 tablets (4 g total) by mouth daily. 120 tablet 3  . cyanocobalamin (,VITAMIN B-12,) 1000 MCG/ML injection Inject 1,000 mcg into the muscle every 30 (thirty) days. Chronic therapy    . fluticasone (FLONASE) 50 MCG/ACT nasal spray Place 1 spray into both nostrils daily.     Marland Kitchen amLODipine (NORVASC) 5 MG tablet TAKE 1 TABLET BY MOUTH EVERY DAY 30 tablet 1   Facility-Administered Medications Prior to Visit  Medication Dose Route Frequency Provider Last Rate Last Dose  . 0.9 %  sodium chloride infusion  500 mL Intravenous Continuous Irene Shipper, MD         Per HPI unless specifically indicated in ROS section below Review of Systems  Constitutional: Negative for activity change, appetite change, chills, fatigue, fever and unexpected weight change.  HENT: Negative for hearing loss.   Eyes: Negative for visual disturbance.  Respiratory: Negative for cough, chest tightness, shortness of breath and wheezing.   Cardiovascular: Negative for chest pain, palpitations and leg swelling.  Gastrointestinal: Negative for abdominal distention, abdominal pain, blood in stool, constipation, diarrhea, nausea and vomiting.  Genitourinary: Negative for difficulty urinating and hematuria.  Musculoskeletal: Negative for arthralgias, myalgias and neck pain.  Skin: Negative for rash.  Neurological: Negative for dizziness, seizures, syncope and headaches.  Hematological: Negative for adenopathy. Does not bruise/bleed easily.  Psychiatric/Behavioral: Negative for dysphoric mood. The patient is not nervous/anxious.        Objective:  BP 120/78 (BP Location: Left Arm, Patient Position: Sitting, Cuff Size: Normal)   Pulse 81   Temp 98.2 F (36.8 C) (Oral)   Ht 6' 0.5" (1.842 m)   Wt 183 lb 12 oz (83.3 kg)   SpO2 95%   BMI 24.58 kg/m   Wt Readings from Last 3 Encounters:  03/19/18 183 lb 12 oz (83.3 kg)    03/13/18 184 lb 8 oz (83.7 kg)  11/13/17 181 lb (82.1 kg)    Physical Exam  Constitutional: He is oriented to person, place, and time. He appears well-developed and well-nourished. No distress.  HENT:  Head: Normocephalic and atraumatic.  Right Ear: Hearing, tympanic membrane, external ear and ear canal normal.  Left Ear: Hearing, tympanic membrane, external ear and ear canal normal.  Nose: Nose normal.  Mouth/Throat: Uvula is midline, oropharynx is clear and moist and mucous membranes are normal. No oropharyngeal exudate, posterior oropharyngeal edema or posterior oropharyngeal erythema.  Eyes: Pupils are equal, round, and reactive to light. Conjunctivae and EOM are normal. No scleral icterus.  Neck: Normal range of motion. Neck supple. Carotid bruit is not present. No thyromegaly present.  Cardiovascular: Normal rate, regular rhythm, normal heart sounds and intact distal pulses.  No murmur heard. Pulses:      Radial pulses are 2+ on the right side, and 2+ on the left side.  Pulmonary/Chest: Effort normal and breath sounds normal. No respiratory distress. He has no wheezes. He has no rales.  Abdominal: Soft. Bowel sounds are normal. He exhibits no distension and no mass. There is no tenderness. There is no rebound and no guarding.  Musculoskeletal: Normal range of motion. He exhibits no edema.  Lymphadenopathy:    He has no cervical adenopathy.  Neurological: He is alert and oriented to person, place, and time.  CN grossly intact, station and gait intact  Skin: Skin is warm and dry. No rash noted.  Psychiatric: He has a normal mood and affect. His behavior is normal. Judgment and thought content normal.  Nursing note and vitals reviewed.  Results for orders placed or performed in visit on 03/13/18  CBC with Differential/Platelet  Result Value Ref Range   WBC 4.4 4.0 - 10.5 K/uL   RBC 4.29 4.22 - 5.81 Mil/uL   Hemoglobin 12.1 (L) 13.0 - 17.0 g/dL   HCT 36.3 (L) 39.0 - 52.0 %    MCV 84.6 78.0 - 100.0 fl   MCHC 33.3 30.0 - 36.0 g/dL   RDW 15.5 11.5 - 15.5 %   Platelets 221.0 150.0 - 400.0 K/uL   Neutrophils Relative % 64.4 43.0 - 77.0 %   Lymphocytes Relative 17.2 12.0 - 46.0 %   Monocytes Relative 13.1 (H) 3.0 - 12.0 %   Eosinophils Relative 4.4 0.0 - 5.0 %   Basophils Relative 0.9 0.0 - 3.0 %   Neutro Abs 2.9 1.4 - 7.7 K/uL   Lymphs Abs 0.8 0.7 - 4.0 K/uL   Monocytes Absolute 0.6 0.1 - 1.0 K/uL   Eosinophils Absolute 0.2 0.0 - 0.7 K/uL   Basophils Absolute 0.0 0.0 - 0.1 K/uL  PSA  Result Value Ref Range   PSA 1.75 0.10 - 4.00 ng/mL  Vitamin B12  Result Value Ref Range   Vitamin B-12 338 211 - 911 pg/mL  Comprehensive metabolic panel  Result Value Ref Range   Sodium 140 135 - 145 mEq/L   Potassium 4.7 3.5 - 5.1 mEq/L   Chloride 106 96 - 112 mEq/L   CO2 26  19 - 32 mEq/L   Glucose, Bld 114 (H) 70 - 99 mg/dL   BUN 13 6 - 23 mg/dL   Creatinine, Ser 1.02 0.40 - 1.50 mg/dL   Total Bilirubin 0.7 0.2 - 1.2 mg/dL   Alkaline Phosphatase 54 39 - 117 U/L   AST 16 0 - 37 U/L   ALT 12 0 - 53 U/L   Total Protein 6.8 6.0 - 8.3 g/dL   Albumin 4.3 3.5 - 5.2 g/dL   Calcium 9.0 8.4 - 10.5 mg/dL   GFR 77.14 >60.00 mL/min  Lipid Panel  Result Value Ref Range   Cholesterol 119 0 - 200 mg/dL   Triglycerides 177.0 (H) 0.0 - 149.0 mg/dL   HDL 40.30 >39.00 mg/dL   VLDL 35.4 0.0 - 40.0 mg/dL   LDL Cholesterol 43 0 - 99 mg/dL   Total CHOL/HDL Ratio 3    NonHDL 78.52       Assessment & Plan:   Problem List Items Addressed This Visit    Psoriasis    Chronic, followed by derm. Has UVB light box at home.       Low vitamin B12 level    Discussed this - monthly B12 shots. Add SL B12 oral supplement. Reassess at f/u visit.       HYPERTENSION, BENIGN ESSENTIAL    Chronic, stable. Continue current regimen.       Relevant Medications   amLODipine (NORVASC) 5 MG tablet   Healthcare maintenance - Primary    Preventative protocols reviewed and updated unless pt  declined. Discussed healthy diet and lifestyle.       Dyslipidemia    Chronic, stable on colestipol.  The ASCVD Risk score Mikey Bussing DC Jr., et al., 2013) failed to calculate for the following reasons:   The valid total cholesterol range is 130 to 320 mg/dL       Crohn's ileocolitis s/p remote ileostomy    Stable period.       Cholelithiasis    Incidental finding. Will continue to monitor.       Bile salt-induced diarrhea    On colestipol.       Advanced care planning/counseling discussion    Advanced directives: has at home. HCPOA is daughter. Will bring copy.           Meds ordered this encounter  Medications  . Cyanocobalamin (B-12) 1000 MCG SUBL    Sig: Place 1 tablet under the tongue daily.    Dispense:  30 each  . docusate sodium (COLACE) 100 MG capsule    Sig: Take 1 capsule (100 mg total) by mouth daily.    Dispense:  10 capsule    Refill:  0  . amLODipine (NORVASC) 5 MG tablet    Sig: Take 1 tablet (5 mg total) by mouth daily.    Dispense:  90 tablet    Refill:  3   No orders of the defined types were placed in this encounter.   Follow up plan: Return in about 1 year (around 03/20/2019) for annual exam, prior fasting for blood work, medicare wellness visit.  Ria Bush, MD

## 2018-03-19 NOTE — Assessment & Plan Note (Signed)
Discussed this - monthly B12 shots. Add SL B12 oral supplement. Reassess at f/u visit.

## 2018-03-19 NOTE — Assessment & Plan Note (Signed)
Preventative protocols reviewed and updated unless pt declined. Discussed healthy diet and lifestyle.  

## 2018-03-19 NOTE — Assessment & Plan Note (Signed)
Chronic, stable on colestipol.  The ASCVD Risk score Mikey Bussing DC Jr., et al., 2013) failed to calculate for the following reasons:   The valid total cholesterol range is 130 to 320 mg/dL

## 2018-03-19 NOTE — Patient Instructions (Addendum)
If interested, check with pharmacy about new 2 shot shingles series (shingrix).  Schedule eye exam as you're due.  Bring [place] copy of your advanced directive to update your chart.  Good to see you today. Call us with questions.  Return as needed or in 1 year for next physical.   Health Maintenance, Male A healthy lifestyle and preventive care is important for your health and wellness. Ask your health care provider about what schedule of regular examinations is right for you. What should I know about weight and diet? Eat a Healthy Diet  Eat plenty of vegetables, fruits, whole grains, low-fat dairy products, and lean protein.  Do not eat a lot of foods high in solid fats, added sugars, or salt.  Maintain a Healthy Weight Regular exercise can help you achieve or maintain a healthy weight. You should:  Do at least 150 minutes of exercise each week. The exercise should increase your heart rate and make you sweat (moderate-intensity exercise).  Do strength-training exercises at least twice a week.  Watch Your Levels of Cholesterol and Blood Lipids  Have your blood tested for lipids and cholesterol every 5 years starting at 68 years of age. If you are at high risk for heart disease, you should start having your blood tested when you are 68 years old. You may need to have your cholesterol levels checked more often if: ? Your lipid or cholesterol levels are high. ? You are older than 68 years of age. ? You are at high risk for heart disease.  What should I know about cancer screening? Many types of cancers can be detected early and may often be prevented. Lung Cancer  You should be screened every year for lung cancer if: ? You are a current smoker who has smoked for at least 30 years. ? You are a former smoker who has quit within the past 15 years.  Talk to your health care provider about your screening options, when you should start screening, and how often you should be  screened.  Colorectal Cancer  Routine colorectal cancer screening usually begins at 68 years of age and should be repeated every 5-10 years until you are 68 years old. You may need to be screened more often if early forms of precancerous polyps or small growths are found. Your health care provider may recommend screening at an earlier age if you have risk factors for colon cancer.  Your health care provider may recommend using home test kits to check for hidden blood in the stool.  A small camera at the end of a tube can be used to examine your colon (sigmoidoscopy or colonoscopy). This checks for the earliest forms of colorectal cancer.  Prostate and Testicular Cancer  Depending on your age and overall health, your health care provider may do certain tests to screen for prostate and testicular cancer.  Talk to your health care provider about any symptoms or concerns you have about testicular or prostate cancer.  Skin Cancer  Check your skin from head to toe regularly.  Tell your health care provider about any new moles or changes in moles, especially if: ? There is a change in a mole's size, shape, or color. ? You have a mole that is larger than a pencil eraser.  Always use sunscreen. Apply sunscreen liberally and repeat throughout the day.  Protect yourself by wearing long sleeves, pants, a wide-brimmed hat, and sunglasses when outside.  What should I know about heart disease, diabetes, and  high blood pressure?  If you are 72-64 years of age, have your blood pressure checked every 3-5 years. If you are 57 years of age or older, have your blood pressure checked every year. You should have your blood pressure measured twice-once when you are at a hospital or clinic, and once when you are not at a hospital or clinic. Record the average of the two measurements. To check your blood pressure when you are not at a hospital or clinic, you can use: ? An automated blood pressure machine at a  pharmacy. ? A home blood pressure monitor.  Talk to your health care provider about your target blood pressure.  If you are between 69-27 years old, ask your health care provider if you should take aspirin to prevent heart disease.  Have regular diabetes screenings by checking your fasting blood sugar level. ? If you are at a normal weight and have a low risk for diabetes, have this test once every three years after the age of 51. ? If you are overweight and have a high risk for diabetes, consider being tested at a younger age or more often.  A one-time screening for abdominal aortic aneurysm (AAA) by ultrasound is recommended for men aged 10-75 years who are current or former smokers. What should I know about preventing infection? Hepatitis B If you have a higher risk for hepatitis B, you should be screened for this virus. Talk with your health care provider to find out if you are at risk for hepatitis B infection. Hepatitis C Blood testing is recommended for:  Everyone born from [birth year] through 1966.  Anyone with known risk factors for hepatitis C.  Sexually Transmitted Diseases (STDs)  You should be screened each year for STDs including gonorrhea and chlamydia if: ? You are sexually active and are younger than 68 years of age. ? You are older than 68 years of age and your health care provider tells you that you are at risk for this type of infection. ? Your sexual activity has changed since you were last screened and you are at an increased risk for chlamydia or gonorrhea. Ask your health care provider if you are at risk.  Talk with your health care provider about whether you are at high risk of being infected with HIV. Your health care provider may recommend a prescription medicine to help prevent HIV infection.  What else can I do?  Schedule regular health, dental, and eye exams.  Stay current with your vaccines (immunizations).  Do not use any tobacco products, such as  cigarettes, chewing tobacco, and e-cigarettes. If you need help quitting, ask your health care provider.  Limit alcohol intake to no more than 2 drinks per day. One drink equals 12 ounces of beer, 5 ounces of wine, or 1 ounces of hard liquor.  Do not use street drugs.  Do not share needles.  Ask your health care provider for help if you need support or information about quitting drugs.  Tell your health care provider if you often feel depressed.  Tell your health care provider if you have ever been abused or do not feel safe at home. This information is not intended to replace advice given to you by your health care provider. Make sure you discuss any questions you have with your health care provider. Document Released: 12/08/2007 Document Revised: 02/08/2016 Document Reviewed: 03/15/2015 Elsevier Interactive Patient Education  [employer].

## 2018-03-19 NOTE — Assessment & Plan Note (Signed)
Incidental finding. Will continue to monitor.

## 2018-03-25 ENCOUNTER — Ambulatory Visit: Payer: Medicare Other

## 2018-03-25 NOTE — Progress Notes (Signed)
I reviewed health advisor's note, was available for consultation, and agree with documentation and plan.  

## 2018-04-02 ENCOUNTER — Ambulatory Visit (INDEPENDENT_AMBULATORY_CARE_PROVIDER_SITE_OTHER): Payer: Medicare Other

## 2018-04-02 DIAGNOSIS — E538 Deficiency of other specified B group vitamins: Secondary | ICD-10-CM

## 2018-04-02 MED ORDER — CYANOCOBALAMIN 1000 MCG/ML IJ SOLN
1000.0000 ug | Freq: Once | INTRAMUSCULAR | Status: AC
Start: 2018-04-02 — End: 2018-04-02
  Administered 2018-04-02: 1000 ug via INTRAMUSCULAR

## 2018-04-02 NOTE — Progress Notes (Signed)
Per orders of Dr. Danise Mina, injection of Vit B 12 given by Ozzie Hoyle. Patient tolerated injection well.

## 2018-05-08 ENCOUNTER — Ambulatory Visit (INDEPENDENT_AMBULATORY_CARE_PROVIDER_SITE_OTHER): Payer: Medicare Other

## 2018-05-08 DIAGNOSIS — E538 Deficiency of other specified B group vitamins: Secondary | ICD-10-CM | POA: Diagnosis not present

## 2018-05-08 MED ORDER — CYANOCOBALAMIN 1000 MCG/ML IJ SOLN
1000.0000 ug | Freq: Once | INTRAMUSCULAR | Status: AC
  Administered 2018-05-08: 1000 ug via INTRAMUSCULAR

## 2018-05-08 NOTE — Progress Notes (Signed)
Pt given B12 1061mg/ml 159min left deltoid. Pt tolerated well.

## 2018-06-12 ENCOUNTER — Ambulatory Visit (INDEPENDENT_AMBULATORY_CARE_PROVIDER_SITE_OTHER): Payer: Medicare Other | Admitting: Emergency Medicine

## 2018-06-12 DIAGNOSIS — E538 Deficiency of other specified B group vitamins: Secondary | ICD-10-CM

## 2018-06-12 MED ORDER — CYANOCOBALAMIN 1000 MCG/ML IJ SOLN
1000.0000 ug | Freq: Once | INTRAMUSCULAR | Status: AC
  Administered 2018-06-12: 1000 ug via INTRAMUSCULAR

## 2018-06-12 NOTE — Progress Notes (Signed)
Per orders of Dr.Gutierrez, injection of b12 given by Elmon Kirschner. Patient tolerated injection well. Patient received in right deltoid.

## 2018-06-22 ENCOUNTER — Other Ambulatory Visit: Payer: Self-pay | Admitting: Internal Medicine

## 2018-07-22 ENCOUNTER — Ambulatory Visit (INDEPENDENT_AMBULATORY_CARE_PROVIDER_SITE_OTHER): Payer: Medicare Other

## 2018-07-22 DIAGNOSIS — E538 Deficiency of other specified B group vitamins: Secondary | ICD-10-CM | POA: Diagnosis not present

## 2018-07-22 MED ORDER — CYANOCOBALAMIN 1000 MCG/ML IJ SOLN
1000.0000 ug | Freq: Once | INTRAMUSCULAR | Status: AC
  Administered 2018-07-22: 1000 ug via INTRAMUSCULAR

## 2018-07-22 NOTE — Progress Notes (Signed)
Pt given monthly B12 in left deltoid. Tolerated well.

## 2018-08-26 ENCOUNTER — Ambulatory Visit (INDEPENDENT_AMBULATORY_CARE_PROVIDER_SITE_OTHER): Payer: Medicare Other

## 2018-08-26 DIAGNOSIS — E538 Deficiency of other specified B group vitamins: Secondary | ICD-10-CM

## 2018-08-26 MED ORDER — CYANOCOBALAMIN 1000 MCG/ML IJ SOLN
1000.0000 ug | Freq: Once | INTRAMUSCULAR | Status: AC
Start: 2018-08-26 — End: 2018-08-26
  Administered 2018-08-26: 1000 ug via INTRAMUSCULAR

## 2018-08-26 NOTE — Progress Notes (Signed)
Per orders of Dr. Ria Bush, injection of B12 given by Lurlean Nanny. Patient tolerated injection well.

## 2018-09-30 ENCOUNTER — Other Ambulatory Visit: Payer: Self-pay

## 2018-09-30 ENCOUNTER — Ambulatory Visit (INDEPENDENT_AMBULATORY_CARE_PROVIDER_SITE_OTHER): Payer: Medicare Other | Admitting: *Deleted

## 2018-09-30 DIAGNOSIS — E538 Deficiency of other specified B group vitamins: Secondary | ICD-10-CM | POA: Diagnosis not present

## 2018-09-30 MED ORDER — CYANOCOBALAMIN 1000 MCG/ML IJ SOLN
1000.0000 ug | Freq: Once | INTRAMUSCULAR | Status: AC
  Administered 2018-09-30: 1000 ug via INTRAMUSCULAR

## 2018-09-30 NOTE — Progress Notes (Signed)
Per orders of Dr. Danise Mina, injection of Vitamin B-12 given by Emelia Salisbury. Patient tolerated injection well.

## 2018-10-28 ENCOUNTER — Encounter: Payer: Self-pay | Admitting: General Surgery

## 2018-10-29 ENCOUNTER — Other Ambulatory Visit: Payer: Self-pay

## 2018-10-29 ENCOUNTER — Encounter: Payer: Self-pay | Admitting: Internal Medicine

## 2018-10-29 ENCOUNTER — Ambulatory Visit (INDEPENDENT_AMBULATORY_CARE_PROVIDER_SITE_OTHER): Payer: Medicare Other | Admitting: Internal Medicine

## 2018-10-29 VITALS — Ht 72.0 in | Wt 175.0 lb

## 2018-10-29 DIAGNOSIS — K9089 Other intestinal malabsorption: Secondary | ICD-10-CM

## 2018-10-29 DIAGNOSIS — K5000 Crohn's disease of small intestine without complications: Secondary | ICD-10-CM

## 2018-10-29 DIAGNOSIS — E538 Deficiency of other specified B group vitamins: Secondary | ICD-10-CM

## 2018-10-29 MED ORDER — COLESTIPOL HCL 1 G PO TABS: 4.0000 g | ORAL_TABLET | Freq: Every day | ORAL | 11 refills | Status: DC

## 2018-10-29 NOTE — Addendum Note (Signed)
Addended by: Candie Mile on: 10/29/2018 09:44 AM   Modules accepted: Orders

## 2018-10-29 NOTE — Patient Instructions (Addendum)
If you are age 69 or older, your body mass index should be between 23-30. Your Body mass index is 23.73 kg/m. If this is out of the aforementioned range listed, please consider follow up with your Primary Care Provider.  If you are age 13 or younger, your body mass index should be between 19-25. Your Body mass index is 23.73 kg/m. If this is out of the aformentioned range listed, please consider follow up with your Primary Care Provider.   We have sent the following medications to your pharmacy for you to pick up at your convenience: Colestid   Continue B12 replacement   Routine office follow-up 1 year  Surveillance colonoscopy around 2023   Thank you for choosing me and [facility].   Scarlette Shorts, MD

## 2018-10-29 NOTE — Progress Notes (Signed)
HISTORY OF PRESENT ILLNESS:  Paul Atkinson is a 68 y.o. male with a history of Crohn's ileocolitis for which she is status post remote ileocecectomy at [facility] greater than 20 years ago.  He has a history of postoperative bile salt induced diarrhea for which she is on Colestid and B12 deficiency for which she is on replacement.  He presents today for his annual follow-up.  His last office visit was Nov 13, 2017 at which time he was asymptomatic.  Patient tells me that he has been well since his last visit.  No change in his clinical status.  GI review of systems is negative.  Recent blood work including hemoglobin was normal.  He does request medication refill.  He is on no chronic medications for Crohn's disease, but rather active surveillance without evidence of disease.  His last colonoscopy was performed August 10, 2016.  Examination was normal.  REVIEW OF SYSTEMS:  All non-GI ROS negative unless otherwise stated in the HPI    Past Medical History:  Diagnosis Date  . Anemia    iron def  . Bile salt-induced diarrhea   . Crohn disease ([place])    status post ileocecectomy 1989  . Disorder of inner ear   . Hearing loss   . Hx of duodenal ulcer   . Hx SBO   . Hypertension   . Malabsorption   . Psoriasis   . Ulcer   . Vitamin B 12 deficiency    secondary to ileal resection    Past Surgical History:  Procedure Laterality Date  . COLONOSCOPY  07/2016   patent anastomosis, no polyps, rpt 5 yrs Henrene Pastor)  . ILEOCECETOMY  1989   peritonitis with SBO from crohn's  . MOLE REMOVAL     back  . MOUTH SURGERY    . SEPTOPLASTY  05/2004   due to deviated septum (Dr. Myna Hidalgo)    Social History [name]  reports that he has never smoked. He has never used smokeless tobacco. He reports that he does not drink alcohol or use drugs.  family history includes CAD (age of onset: 46) in his father; Diabetes (age of onset: 22) in his father; Heart disease in his mother; Hyperlipidemia in his  father; Hypertension in his mother; Kidney disease in his father and mother.  Allergies  Allergen Reactions  . Penicillins     REACTION: RASH in throat       PHYSICAL EXAMINATION: No physical exam with telehealth visit   ASSESSMENT:  1.  Crohn's ileocolitis status post remote ileocecectomy.  On no Crohn's specific medications without evidence of disease.  Last colonoscopy 2018 2.  Bile salt induced diarrhea.  Managed with Colestid 3.  B12 deficiency.  On replacement therapy with his PCP   PLAN:  1.  REFILL PRESCRIPTION of Colestid.  1 year's worth of refills.  The patient requests [facility] at [place]. [place] 2.  Continue B12 replacement 3.  Routine office follow-up 1 year 4.  Surveillance colonoscopy around 2023 This WebEx A/V telemedicine visit was initiated by and consented for by the patient.  He was in his home and I was in my office during the encounter.  He understands her may be an associated professional charge.

## 2018-11-18 ENCOUNTER — Ambulatory Visit (INDEPENDENT_AMBULATORY_CARE_PROVIDER_SITE_OTHER): Payer: Medicare Other

## 2018-11-18 DIAGNOSIS — E538 Deficiency of other specified B group vitamins: Secondary | ICD-10-CM | POA: Diagnosis not present

## 2018-11-18 MED ORDER — CYANOCOBALAMIN 1000 MCG/ML IJ SOLN
1000.0000 ug | Freq: Once | INTRAMUSCULAR | Status: AC
  Administered 2018-11-18: 1000 ug via INTRAMUSCULAR

## 2018-11-18 NOTE — Progress Notes (Signed)
Per orders of Dr. Danise Mina injection of Vitamin B12 given by Diamond Nickel, RN.  Administered IM to right deltoid.   Patient tolerated injection well.

## 2018-12-31 ENCOUNTER — Ambulatory Visit (INDEPENDENT_AMBULATORY_CARE_PROVIDER_SITE_OTHER): Payer: Medicare Other

## 2018-12-31 ENCOUNTER — Other Ambulatory Visit: Payer: Self-pay

## 2018-12-31 DIAGNOSIS — E538 Deficiency of other specified B group vitamins: Secondary | ICD-10-CM

## 2018-12-31 MED ORDER — CYANOCOBALAMIN 1000 MCG/ML IJ SOLN
1000.0000 ug | Freq: Once | INTRAMUSCULAR | Status: AC
  Administered 2018-12-31: 09:00:00 1000 ug via INTRAMUSCULAR

## 2018-12-31 NOTE — Progress Notes (Signed)
Per orders of Dr. Danise Mina, injection of vit P82 given by Brenton Grills. Patient tolerated injection well.

## 2019-01-09 ENCOUNTER — Other Ambulatory Visit: Payer: Self-pay | Admitting: Family Medicine

## 2019-02-03 ENCOUNTER — Other Ambulatory Visit: Payer: Self-pay

## 2019-02-03 ENCOUNTER — Ambulatory Visit (INDEPENDENT_AMBULATORY_CARE_PROVIDER_SITE_OTHER): Payer: Medicare Other

## 2019-02-03 DIAGNOSIS — E538 Deficiency of other specified B group vitamins: Secondary | ICD-10-CM

## 2019-02-03 MED ORDER — CYANOCOBALAMIN 1000 MCG/ML IJ SOLN
1000.0000 ug | Freq: Once | INTRAMUSCULAR | Status: AC
  Administered 2019-02-03: 09:00:00 1000 ug via INTRAMUSCULAR

## 2019-02-03 NOTE — Progress Notes (Signed)
Per orders of Dr. Danise Mina, injection of B12 given by Kris Mouton. Patient tolerated injection well.

## 2019-03-11 ENCOUNTER — Ambulatory Visit (INDEPENDENT_AMBULATORY_CARE_PROVIDER_SITE_OTHER): Payer: Medicare Other

## 2019-03-11 DIAGNOSIS — Z23 Encounter for immunization: Secondary | ICD-10-CM | POA: Diagnosis not present

## 2019-03-11 DIAGNOSIS — E538 Deficiency of other specified B group vitamins: Secondary | ICD-10-CM

## 2019-03-11 MED ORDER — CYANOCOBALAMIN 1000 MCG/ML IJ SOLN
1000.0000 ug | Freq: Once | INTRAMUSCULAR | Status: AC
  Administered 2019-03-11: 10:00:00 1000 ug via INTRAMUSCULAR

## 2019-03-11 NOTE — Progress Notes (Signed)
Per orders of Dr. Danise Mina, injection of B12 and high dose Flu given by Lurlean Nanny.  Patient tolerated injection well.

## 2019-03-19 ENCOUNTER — Other Ambulatory Visit: Payer: Self-pay | Admitting: Family Medicine

## 2019-03-19 DIAGNOSIS — I10 Essential (primary) hypertension: Secondary | ICD-10-CM

## 2019-03-19 DIAGNOSIS — E785 Hyperlipidemia, unspecified: Secondary | ICD-10-CM

## 2019-03-19 DIAGNOSIS — E538 Deficiency of other specified B group vitamins: Secondary | ICD-10-CM

## 2019-03-19 DIAGNOSIS — D518 Other vitamin B12 deficiency anemias: Secondary | ICD-10-CM

## 2019-03-19 DIAGNOSIS — Z125 Encounter for screening for malignant neoplasm of prostate: Secondary | ICD-10-CM

## 2019-03-19 DIAGNOSIS — K9089 Other intestinal malabsorption: Secondary | ICD-10-CM

## 2019-03-20 ENCOUNTER — Ambulatory Visit: Payer: Medicare Other

## 2019-03-20 ENCOUNTER — Ambulatory Visit (INDEPENDENT_AMBULATORY_CARE_PROVIDER_SITE_OTHER): Payer: Medicare Other

## 2019-03-20 ENCOUNTER — Other Ambulatory Visit (INDEPENDENT_AMBULATORY_CARE_PROVIDER_SITE_OTHER): Payer: Medicare Other

## 2019-03-20 VITALS — Wt 176.0 lb

## 2019-03-20 DIAGNOSIS — K9089 Other intestinal malabsorption: Secondary | ICD-10-CM | POA: Diagnosis not present

## 2019-03-20 DIAGNOSIS — D518 Other vitamin B12 deficiency anemias: Secondary | ICD-10-CM

## 2019-03-20 DIAGNOSIS — E538 Deficiency of other specified B group vitamins: Secondary | ICD-10-CM | POA: Diagnosis not present

## 2019-03-20 DIAGNOSIS — E785 Hyperlipidemia, unspecified: Secondary | ICD-10-CM | POA: Diagnosis not present

## 2019-03-20 DIAGNOSIS — Z125 Encounter for screening for malignant neoplasm of prostate: Secondary | ICD-10-CM | POA: Diagnosis not present

## 2019-03-20 DIAGNOSIS — I10 Essential (primary) hypertension: Secondary | ICD-10-CM

## 2019-03-20 DIAGNOSIS — Z0000 Encounter for general adult medical examination without abnormal findings: Secondary | ICD-10-CM | POA: Diagnosis not present

## 2019-03-20 LAB — COMPREHENSIVE METABOLIC PANEL
ALT: 15 U/L (ref 0–53)
AST: 18 U/L (ref 0–37)
Albumin: 4.1 g/dL (ref 3.5–5.2)
Alkaline Phosphatase: 56 U/L (ref 39–117)
BUN: 10 mg/dL (ref 6–23)
CO2: 27 mEq/L (ref 19–32)
Calcium: 8.9 mg/dL (ref 8.4–10.5)
Chloride: 105 mEq/L (ref 96–112)
Creatinine, Ser: 1.02 mg/dL (ref 0.40–1.50)
GFR: 72.36 mL/min (ref >60.00)
Glucose, Bld: 106 mg/dL — ABNORMAL HIGH (ref 70–99)
Potassium: 4.1 mEq/L (ref 3.5–5.1)
Sodium: 140 mEq/L (ref 135–145)
Total Bilirubin: 1 mg/dL (ref 0.2–1.2)
Total Protein: 6.4 g/dL (ref 6.0–8.3)

## 2019-03-20 LAB — LIPID PANEL
Cholesterol: 109 mg/dL (ref 0–200)
HDL: 40.6 mg/dL (ref >39.00)
LDL Cholesterol: 34 mg/dL (ref 0–99)
NonHDL: 68.45
Total CHOL/HDL Ratio: 3
Triglycerides: 173 mg/dL — ABNORMAL HIGH (ref 0.0–149.0)
VLDL: 34.6 mg/dL (ref 0.0–40.0)

## 2019-03-20 LAB — CBC WITH DIFFERENTIAL/PLATELET
Basophils Absolute: 0 10*3/uL (ref 0.0–0.1)
Basophils Relative: 0.8 % (ref 0.0–3.0)
Eosinophils Absolute: 0.3 10*3/uL (ref 0.0–0.7)
Eosinophils Relative: 4.5 % (ref 0.0–5.0)
HCT: 37.9 % — ABNORMAL LOW (ref 39.0–52.0)
Hemoglobin: 13 g/dL (ref 13.0–17.0)
Lymphocytes Relative: 17.4 % (ref 12.0–46.0)
Lymphs Abs: 1 10*3/uL (ref 0.7–4.0)
MCHC: 34.2 g/dL (ref 30.0–36.0)
MCV: 89.8 fl (ref 78.0–100.0)
Monocytes Absolute: 0.7 10*3/uL (ref 0.1–1.0)
Monocytes Relative: 11.8 % (ref 3.0–12.0)
Neutro Abs: 3.6 10*3/uL (ref 1.4–7.7)
Neutrophils Relative %: 65.5 % (ref 43.0–77.0)
Platelets: 215 10*3/uL (ref 150.0–400.0)
RBC: 4.22 Mil/uL (ref 4.22–5.81)
RDW: 15.3 % (ref 11.5–15.5)
WBC: 5.6 10*3/uL (ref 4.0–10.5)

## 2019-03-20 LAB — MICROALBUMIN / CREATININE URINE RATIO
Creatinine,U: 136.3 mg/dL
Microalb Creat Ratio: 0.5 mg/g (ref 0.0–30.0)
Microalb, Ur: <0.7 mg/dL (ref 0.0–1.9)

## 2019-03-20 LAB — PSA, MEDICARE: PSA: 1.56 ng/ml (ref 0.10–4.00)

## 2019-03-20 LAB — VITAMIN B12: Vitamin B-12: 402 pg/mL (ref 211–911)

## 2019-03-20 NOTE — Patient Instructions (Signed)
[name] , Thank you for taking time to come for your Medicare Wellness Visit. I appreciate your ongoing commitment to your health goals. Please review the following plan we discussed and let me know if I can assist you in the future.   Screening recommendations/referrals: Colonoscopy: up to date, completed 08/10/2016 Recommended yearly ophthalmology/optometry visit for glaucoma screening and checkup Recommended yearly dental visit for hygiene and checkup  Vaccinations: Influenza vaccine: up to date, completed 03/11/2019 Pneumococcal vaccine: series completed Tdap vaccine: up to date, completed 08/30/2010 Shingles vaccine: Will talk with provider about receiving this vaccine at upcoming physical    Advanced directives: Please bring a copy of your POA (Power of [place]) and/or Living Will to your next appointment.   Conditions/risks identified: hypertension, hyperlipidemia  Next appointment: 03/23/2019 @ 8:30 am   Preventive Care 69 Years and Older, Male Preventive care refers to lifestyle choices and visits with your health care provider that can promote health and wellness. What does preventive care include?  A yearly physical exam. This is also called an annual well check.  Dental exams once or twice a year.  Routine eye exams. Ask your health care provider how often you should have your eyes checked.  Personal lifestyle choices, including:  Daily care of your teeth and gums.  Regular physical activity.  Eating a healthy diet.  Avoiding tobacco and drug use.  Limiting alcohol use.  Practicing safe sex.  Taking low doses of aspirin every day.  Taking vitamin and mineral supplements as recommended by your health care provider. What happens during an annual well check? The services and screenings done by your health care provider during your annual well check will depend on your age, overall health, lifestyle risk factors, and family history of disease. Counseling   Your health care provider may ask you questions about your:  Alcohol use.  Tobacco use.  Drug use.  Emotional well-being.  Home and relationship well-being.  Sexual activity.  Eating habits.  History of falls.  Memory and ability to understand (cognition).  Work and work Statistician. Screening  You may have the following tests or measurements:  Height, weight, and BMI.  Blood pressure.  Lipid and cholesterol levels. These may be checked every 5 years, or more frequently if you are over 64 years old.  Skin check.  Lung cancer screening. You may have this screening every year starting at age 73 if you have a 30-pack-year history of smoking and currently smoke or have quit within the past 15 years.  Fecal occult blood test (FOBT) of the stool. You may have this test every year starting at age 55.  Flexible sigmoidoscopy or colonoscopy. You may have a sigmoidoscopy every 5 years or a colonoscopy every 10 years starting at age 22.  Prostate cancer screening. Recommendations will vary depending on your family history and other risks.  Hepatitis C blood test.  Hepatitis B blood test.  Sexually transmitted disease (STD) testing.  Diabetes screening. This is done by checking your blood sugar (glucose) after you have not eaten for a while (fasting). You may have this done every 1-3 years.  Abdominal aortic aneurysm (AAA) screening. You may need this if you are a current or former smoker.  Osteoporosis. You may be screened starting at age 28 if you are at high risk. Talk with your health care provider about your test results, treatment options, and if necessary, the need for more tests. Vaccines  Your health care provider may recommend certain vaccines, such  as:  Influenza vaccine. This is recommended every year.  Tetanus, diphtheria, and acellular pertussis (Tdap, Td) vaccine. You may need a Td booster every 10 years.  Zoster vaccine. You may need this after age 25.   Pneumococcal 13-valent conjugate (PCV13) vaccine. One dose is recommended after age 53.  Pneumococcal polysaccharide (PPSV23) vaccine. One dose is recommended after age 42. Talk to your health care provider about which screenings and vaccines you need and how often you need them. This information is not intended to replace advice given to you by your health care provider. Make sure you discuss any questions you have with your health care provider. Document Released: 07/08/2015 Document Revised: 02/29/2016 Document Reviewed: 04/12/2015 Elsevier Interactive Patient Education  2017 [place] Prevention in the Home Falls can cause injuries. They can happen to people of all ages. There are many things you can do to make your home safe and to help prevent falls. What can I do on the outside of my home?  Regularly fix the edges of walkways and driveways and fix any cracks.  Remove anything that might make you trip as you walk through a door, such as a raised step or threshold.  Trim any bushes or trees on the path to your home.  Use bright outdoor lighting.  Clear any walking paths of anything that might make someone trip, such as rocks or tools.  Regularly check to see if handrails are loose or broken. Make sure that both sides of any steps have handrails.  Any raised decks and porches should have guardrails on the edges.  Have any leaves, snow, or ice cleared regularly.  Use sand or salt on walking paths during winter.  Clean up any spills in your garage right away. This includes oil or grease spills. What can I do in the bathroom?  Use night lights.  Install grab bars by the toilet and in the tub and shower. Do not use towel bars as grab bars.  Use non-skid mats or decals in the tub or shower.  If you need to sit down in the shower, use a plastic, non-slip stool.  Keep the floor dry. Clean up any water that spills on the floor as soon as it happens.  Remove soap  buildup in the tub or shower regularly.  Attach bath mats securely with double-sided non-slip rug tape.  Do not have throw rugs and other things on the floor that can make you trip. What can I do in the bedroom?  Use night lights.  Make sure that you have a light by your bed that is easy to reach.  Do not use any sheets or blankets that are too big for your bed. They should not hang down onto the floor.  Have a firm chair that has side arms. You can use this for support while you get dressed.  Do not have throw rugs and other things on the floor that can make you trip. What can I do in the kitchen?  Clean up any spills right away.  Avoid walking on wet floors.  Keep items that you use a lot in easy-to-reach places.  If you need to reach something above you, use a strong step stool that has a grab bar.  Keep electrical cords out of the way.  Do not use floor polish or wax that makes floors slippery. If you must use wax, use non-skid floor wax.  Do not have throw rugs and other things on the  floor that can make you trip. What can I do with my stairs?  Do not leave any items on the stairs.  Make sure that there are handrails on both sides of the stairs and use them. Fix handrails that are broken or loose. Make sure that handrails are as long as the stairways.  Check any carpeting to make sure that it is firmly attached to the stairs. Fix any carpet that is loose or worn.  Avoid having throw rugs at the top or bottom of the stairs. If you do have throw rugs, attach them to the floor with carpet tape.  Make sure that you have a light switch at the top of the stairs and the bottom of the stairs. If you do not have them, ask someone to add them for you. What else can I do to help prevent falls?  Wear shoes that:  Do not have high heels.  Have rubber bottoms.  Are comfortable and fit you well.  Are closed at the toe. Do not wear sandals.  If you use a stepladder:  Make  sure that it is fully opened. Do not climb a closed stepladder.  Make sure that both sides of the stepladder are locked into place.  Ask someone to hold it for you, if possible.  Clearly mark and make sure that you can see:  Any grab bars or handrails.  First and last steps.  Where the edge of each step is.  Use tools that help you move around (mobility aids) if they are needed. These include:  Canes.  Walkers.  Scooters.  Crutches.  Turn on the lights when you go into a dark area. Replace any light bulbs as soon as they burn out.  Set up your furniture so you have a clear path. Avoid moving your furniture around.  If any of your floors are uneven, fix them.  If there are any pets around you, be aware of where they are.  Review your medicines with your doctor. Some medicines can make you feel dizzy. This can increase your chance of falling. Ask your doctor what other things that you can do to help prevent falls. This information is not intended to replace advice given to you by your health care provider. Make sure you discuss any questions you have with your health care provider. Document Released: 04/07/2009 Document Revised: 11/17/2015 Document Reviewed: 07/16/2014 Elsevier Interactive Patient Education  2017 [employer].

## 2019-03-20 NOTE — Progress Notes (Signed)
Subjective:   Paul Atkinson is a 68 y.o. male who presents for Medicare Annual/Subsequent preventive examination.  Review of Systems:    This visit is being conducted through telemedicine via telephone at the nurse health advisor's home address due to the COVID-19 pandemic. This patient has given me verbal consent via doximity to conduct this visit, patient states they are participating from their home address. Some vital signs may be absent or patient reported.    Patient identification: identified by name, DOB, and current address  Cardiac Risk Factors include: advanced age (>41mn, >>89women);hypertension;dyslipidemia;male gender;sedentary lifestyle     Objective:    Vitals: Wt 176 lb (79.8 kg)   BMI 23.87 kg/m   Body mass index is 23.87 kg/m.  Advanced Directives 03/20/2019 03/13/2018 03/12/2017  Does Patient Have a Medical Advance Directive? Yes Yes Yes  Type of AParamedicof [place] will [place]Living will [place]cre[place]Living will  Does patient want to make changes to medical advance directive? - - Yes (MAU/Ambulatory/Procedural Areas - Information given)  Copy of [place] Chart? No - copy requested No - copy requested No - copy requested    Tobacco Social History   Tobacco Use  Smoking Status Never Smoker  Smokeless Tobacco Never Used     Counseling given: Not Answered   Clinical Intake:  Pre-visit preparation completed: Yes  Pain : No/denies pain     Nutritional Risks: None Diabetes: No  How often do you need to have someone help you when you read instructions, pamphlets, or other written materials from your doctor or pharmacy?: 1 - Never What is the last grade level you completed in school?: Bachelors  Interpreter Needed?: No  Information entered by :: CJohnson, LPN  Past Medical History:  Diagnosis Date  . Anemia    iron def  . Bile salt-induced diarrhea    . Crohn disease (HWallace    status post ileocecectomy 1989  . Disorder of inner ear   . Hearing loss   . Hx of duodenal ulcer   . Hx SBO   . Hypertension   . Malabsorption   . Psoriasis   . Ulcer   . Vitamin B 12 deficiency    secondary to ileal resection   Past Surgical History:  Procedure Laterality Date  . COLONOSCOPY  07/2016   patent anastomosis, no polyps, rpt 5 yrs (Paul Atkinson  . ILEOCECETOMY  1989   peritonitis with SBO from crohn's  . MOLE REMOVAL     back  . MOUTH SURGERY    . SEPTOPLASTY  05/2004   due to deviated septum (Paul Atkinson   Family History  Problem Relation Age of Onset  . Heart disease Mother        CHF, arrhythmias  . Kidney disease Mother        kidney failure  . Hypertension Mother   . Kidney disease Father        renal failure  . CAD Father 812      MI stents  . Diabetes Father 80       on insulin  . Hyperlipidemia Father   . Cancer Neg Hx   . Colon cancer Neg Hx    Social History   Socioeconomic History  . Marital status: Widowed    Spouse name: Not on file  . Number of children: 2  . Years of education: Not on file  . Highest education level: Not  on file  Occupational History  . Occupation: AGENT-retired    Employer: [employer]    Comment: Insurance  Social Needs  . Financial resource strain: Not hard at all  . Food insecurity    Worry: Never true    Inability: Never true  . Transportation needs    Medical: No    Non-medical: No  Tobacco Use  . Smoking status: Never Smoker  . Smokeless tobacco: Never Used  Substance and Sexual Activity  . Alcohol use: No    Alcohol/week: 0.0 standard drinks    Comment: very rarely  . Drug use: No  . Sexual activity: Yes  Lifestyle  . Physical activity    Days per week: 0 days    Minutes per session: 0 min  . Stress: Not at all  Relationships  . Social Herbalist on phone: Not on file    Gets together: Not on file    Attends religious service: Not on file     Active member of club or organization: Not on file    Attends meetings of clubs or organizations: Not on file    Relationship status: Not on file  Other Topics Concern  . Not on file  Social History Narrative   occasional caffeine   Widower - wife Paul Atkinson passed away from metastatic cancer 2019   Occupation: retired, Medical illustrator   Activity: no regular exercise but stays active   Diet: good water, fruits/vegetables some    Outpatient Encounter Medications as of 03/20/2019  Medication Sig  . amLODipine (NORVASC) 5 MG tablet TAKE 1 TABLET BY MOUTH EVERY DAY  . colestipol (COLESTID) 1 g tablet Take 4 tablets (4 g total) by mouth daily.  . cyanocobalamin (,VITAMIN B-12,) 1000 MCG/ML injection Inject 1,000 mcg into the muscle every 30 (thirty) days. Chronic therapy  . docusate sodium (COLACE) 100 MG capsule Take 1 capsule (100 mg total) by mouth daily. (Patient taking differently: Take 100 mg by mouth daily as needed. )  . fluticasone (FLONASE) 50 MCG/ACT nasal spray Place 1 spray into both nostrils daily.   . Cyanocobalamin (B-12) 1000 MCG SUBL Place 1 tablet under the tongue daily. (Patient not taking: Reported on 03/20/2019)   Facility-Administered Encounter Medications as of 03/20/2019  Medication  . 0.9 %  sodium chloride infusion    Activities of Daily Living In your present state of health, do you have any difficulty performing the following activities: 03/20/2019  Hearing? Y  Comment Patient states his hearing "sucks"  Vision? N  Difficulty concentrating or making decisions? N  Walking or climbing stairs? N  Dressing or bathing? N  Doing errands, shopping? N  Preparing Food and eating ? N  Using the Toilet? N  In the past six months, have you accidently leaked urine? N  Do you have problems with loss of bowel control? N  Managing your Medications? N  Managing your Finances? N  Housekeeping or managing your Housekeeping? N  Some recent data might be hidden    Patient  Care Team: Paul Bush, MD as PCP - General (Family Medicine)   Assessment:   This is a routine wellness examination for Paul Atkinson.  Exercise Activities and Dietary recommendations Current Exercise Habits: The patient does not participate in regular exercise at present, Exercise limited by: None identified  Goals    . Eat more fruits and vegetables     Starting 03/13/2018, I will continue to eat at least 4-5 servings of vegetables daily.     Marland Kitchen  Patient Stated     03/20/2019, Patient will try to exercise more daily        Fall Risk Fall Risk  03/20/2019 03/13/2018 03/12/2017 10/04/2015  Falls in the past year? 0 No No No  Number falls in past yr: 0 - - -  Risk for fall due to : Medication side effect - - -  Follow up Falls evaluation completed;Falls prevention discussed - - -   Is the patient's home free of loose throw rugs in walkways, pet beds, electrical cords, etc?   yes      Grab bars in the bathroom? yes      Handrails on the stairs?   yes      Adequate lighting?   yes  Timed Get Up and Go Performed: n/a  Depression Screen PHQ 2/9 Scores 03/20/2019 03/13/2018 03/12/2017 12/12/2016  PHQ - 2 Score 0 0 0 2  PHQ- 9 Score 0 0 1 2    Cognitive Function MMSE - Mini Mental State Exam 03/20/2019 03/13/2018 03/12/2017  Orientation to time 5 5 5   Orientation to Place 5 5 5   Registration 3 3 3   Attention/ Calculation 5 0 0  Recall 3 3 3   Language- name 2 objects - 0 0  Language- repeat 1 1 1   Language- follow 3 step command - 3 3  Language- read & follow direction - 0 0  Write a sentence - 0 0  Copy design - 0 0  Total score - 20 20     Mini Cog  Mini-Cog screen was completed. Maximum score is 22. A value of 0 denotes this part of the MMSE was not completed or the patient failed this part of the Mini-Cog screening.    Immunization History  Administered Date(s) Administered  . Fluad Quad(high Dose 65+) 03/11/2019  . H1N1 07/01/2008  . Influenza Split 03/29/2011, 04/25/2012   . Influenza Whole 03/26/2006, 03/28/2007, 03/10/2008, 05/26/2009, 03/23/2010  . Influenza, Seasonal, Injecte, Preservative Fre 04/26/2014  . Influenza,inj,Quad PF,6+ Mos 04/27/2015, 04/24/2016, 03/12/2017, 03/13/2018  . Influenza-Unspecified 03/25/2014  . Pneumococcal Conjugate-13 10/04/2015  . Pneumococcal Polysaccharide-23 03/12/2017  . Td 11/07/2005, 08/30/2010  . Zoster 08/30/2010    Qualifies for Shingles Vaccine? Yes   Screening Tests Health Maintenance  Topic Date Due  . DTaP/Tdap/Td (1 - Tdap) 08/29/2020 (Originally 12/22/1969)  . TETANUS/TDAP  08/29/2020  . COLONOSCOPY  08/10/2021  . INFLUENZA VACCINE  Completed  . Hepatitis C Screening  Completed  . PNA vac Low Risk Adult  Completed   Cancer Screenings: Lung: Low Dose CT Chest recommended if Age 63-80 years, 30 pack-year currently smoking OR have quit w/in 15years. Patient does not qualify. Colorectal: completed 08/10/2016  Additional Screenings:  Hepatitis C Screening: 09/27/2015      Plan:    Patient will try to start exercising more.   I have personally reviewed and noted the following in the patient's chart:   . Medical and social history . Use of alcohol, tobacco or illicit drugs  . Current medications and supplements . Functional ability and status . Nutritional status . Physical activity . Advanced directives . List of other physicians . Hospitalizations, surgeries, and ER visits in previous 12 months . Vitals . Screenings to include cognitive, depression, and falls . Referrals and appointments  In addition, I have reviewed and discussed with patient certain preventive protocols, quality metrics, and best practice recommendations. A written personalized care plan for preventive services as well as general preventive health recommendations were provided to  patient.     Andrez Grime, LPN  [id number]

## 2019-03-20 NOTE — Progress Notes (Signed)
PCP notes: none  Health Maintenance: Patient wants to talk about receiving the Shingrix vaccine.     Abnormal Screenings: none    Patient concerns: none    Nurse concerns: none    Next PCP appt.: 03/23/2019 @ 8:30 am

## 2019-03-23 ENCOUNTER — Other Ambulatory Visit: Payer: Self-pay

## 2019-03-23 ENCOUNTER — Encounter: Payer: Self-pay | Admitting: Family Medicine

## 2019-03-23 ENCOUNTER — Ambulatory Visit (INDEPENDENT_AMBULATORY_CARE_PROVIDER_SITE_OTHER): Payer: Medicare Other | Admitting: Family Medicine

## 2019-03-23 VITALS — BP 138/82 | HR 76 | Temp 97.3°F | Ht 71.5 in | Wt 180.2 lb

## 2019-03-23 DIAGNOSIS — E538 Deficiency of other specified B group vitamins: Secondary | ICD-10-CM

## 2019-03-23 DIAGNOSIS — Z0000 Encounter for general adult medical examination without abnormal findings: Secondary | ICD-10-CM | POA: Diagnosis not present

## 2019-03-23 DIAGNOSIS — K9089 Other intestinal malabsorption: Secondary | ICD-10-CM

## 2019-03-23 DIAGNOSIS — K5080 Crohn's disease of both small and large intestine without complications: Secondary | ICD-10-CM | POA: Diagnosis not present

## 2019-03-23 DIAGNOSIS — D649 Anemia, unspecified: Secondary | ICD-10-CM

## 2019-03-23 DIAGNOSIS — E785 Hyperlipidemia, unspecified: Secondary | ICD-10-CM

## 2019-03-23 DIAGNOSIS — I10 Essential (primary) hypertension: Secondary | ICD-10-CM

## 2019-03-23 DIAGNOSIS — L409 Psoriasis, unspecified: Secondary | ICD-10-CM

## 2019-03-23 DIAGNOSIS — Z7189 Other specified counseling: Secondary | ICD-10-CM

## 2019-03-23 DIAGNOSIS — R7989 Other specified abnormal findings of blood chemistry: Secondary | ICD-10-CM

## 2019-03-23 MED ORDER — AMLODIPINE BESYLATE 5 MG PO TABS: 5.0000 mg | ORAL_TABLET | Freq: Every day | ORAL | 3 refills | Status: DC

## 2019-03-23 NOTE — Assessment & Plan Note (Signed)
Continue b12 shot. He has chronically donated blood, latest high volume RBC donations. Will stop donating for the time being, reassess CBC next labs.

## 2019-03-23 NOTE — Assessment & Plan Note (Signed)
Chronic, largely trig elevated. Continue colestipol for h/o bile salt-induced diarrhea.  The ASCVD Risk score Paul Bussing DC Jr., et al., 2013) failed to calculate for the following reasons:   The valid total cholesterol range is 130 to 320 mg/dL

## 2019-03-23 NOTE — Assessment & Plan Note (Signed)
Chronic, followed by derm. UVB light box at home.

## 2019-03-23 NOTE — Progress Notes (Signed)
This visit was conducted in person.  BP 138/82   Pulse 76   Temp (!) 97.3 F (36.3 C)   Ht 5' 11.5" (1.816 m)   Wt 180 lb 4 oz (81.8 kg)   SpO2 97%   BMI 24.79 kg/m    CC: CPE Subjective:    Patient ID: Paul Atkinson, male    DOB: 04/12/1951, 68 y.o.   MRN: [id number]  HPI: Paul Atkinson is a 68 y.o. male presenting on 03/23/2019 for Annual Exam (part 2)   Saw health advisor last week for medicare wellness visit. Note reviewed.    Hearing Screening   125Hz  250Hz  500Hz  1000Hz  2000Hz  3000Hz  4000Hz  6000Hz  8000Hz   Right ear:   40 40 0  0    Left ear:   20 20 20   40      Visual Acuity Screening   Right eye Left eye Both eyes  Without correction: 20/30 20/50 20/30   With correction:         Clinical Support from 03/20/2019 in [place] at [facility] Total Score  0      Fall Risk  03/20/2019 03/13/2018 03/12/2017 10/04/2015  Falls in the past year? 0 No No No  Number falls in past yr: 0 - - -  Risk for fall due to : Medication side effect - - -  Follow up Falls evaluation completed;Falls prevention discussed - - -     Got bitten by fire ants recently.   H/o crohn's ileocolitis s/p ilocecectomy remotely - receives b12 shots monthly. Persistently low levels despite this.  Bile salt induced diarrhea - on colestipol. Has seen rheumatologist - referred by derm for further eval of chronic psoriasis. Tested negative for psoriatic arthritis. Intermittent lower back pain managed with minimal tylenol and advil.   Preventative: Colonoscopy -07/2016, stable.patent anastomosis.Rec rpt 5 years (Dr. Henrene Pastor) Prostate cancer screening - minor sxs, attributed to enlarged prostate.Nocturia x1.Previously agreed to stop screening for nowas long as PSA remains stable.  Flu shot yearly Pneumovax 02/2017, prevnar 09/2015  Td 2012  zostavax 2012  shingrix - discussed  Advanced directives: has at home.HCPOA is daughter. Will bring copy. New packet provided today.  Seat  belt use discussed.  Sunscreen use discussed. No changing moles on skin. Sees derm yearly.  Non smoker  Alcohol - none Dentist Q6 mo  Eye exam - yearly. H/o iritis remotely  Bowel - no constipation  Bladder - no incontinence   Occasional caffeine  Lives alone (widower 2019, lost wife to colon cancer), no pets  Occupation: retired, International aid/development worker  Activity: no regular exercise but stays active  Diet: good water, fruits/vegetables some     Relevant past medical, surgical, family and social history reviewed and updated as indicated. Interim medical history since our last visit reviewed. Allergies and medications reviewed and updated. Outpatient Medications Prior to Visit  Medication Sig Dispense Refill  . colestipol (COLESTID) 1 g tablet Take 4 tablets (4 g total) by mouth daily. 120 tablet 11  . cyanocobalamin (,VITAMIN B-12,) 1000 MCG/ML injection Inject 1,000 mcg into the muscle every 30 (thirty) days. Chronic therapy    . docusate sodium (COLACE) 100 MG capsule Take 1 capsule (100 mg total) by mouth daily. (Patient taking differently: Take 100 mg by mouth daily as needed. ) 10 capsule 0  . fluticasone (FLONASE) 50 MCG/ACT nasal spray Place 1 spray into both nostrils daily.     Marland Kitchen amLODipine (NORVASC) 5 MG tablet TAKE 1  TABLET BY MOUTH EVERY DAY 90 tablet 0  . Cyanocobalamin (B-12) 1000 MCG SUBL Place 1 tablet under the tongue daily. (Patient not taking: Reported on 03/20/2019) 30 each    Facility-Administered Medications Prior to Visit  Medication Dose Route Frequency Provider Last Rate Last Dose  . 0.9 %  sodium chloride infusion  500 mL Intravenous Continuous Irene Shipper, MD         Per HPI unless specifically indicated in ROS section below Review of Systems  Constitutional: Negative for activity change, appetite change, chills, fatigue, fever and unexpected weight change.  HENT: Negative for hearing loss.   Eyes: Negative for visual disturbance.   Respiratory: Negative for cough, chest tightness, shortness of breath and wheezing.   Cardiovascular: Negative for chest pain, palpitations and leg swelling.  Gastrointestinal: Negative for abdominal distention, abdominal pain, blood in stool, constipation, diarrhea, nausea and vomiting.  Genitourinary: Negative for difficulty urinating and hematuria.  Musculoskeletal: Negative for arthralgias, myalgias and neck pain.  Skin: Negative for rash.  Neurological: Negative for dizziness, seizures, syncope and headaches.  Hematological: Negative for adenopathy. Does not bruise/bleed easily.  Psychiatric/Behavioral: Negative for dysphoric mood. The patient is not nervous/anxious.    Objective:    BP 138/82   Pulse 76   Temp (!) 97.3 F (36.3 C)   Ht 5' 11.5" (1.816 m)   Wt 180 lb 4 oz (81.8 kg)   SpO2 97%   BMI 24.79 kg/m   Wt Readings from Last 3 Encounters:  03/23/19 180 lb 4 oz (81.8 kg)  03/20/19 176 lb (79.8 kg)  10/29/18 175 lb (79.4 kg)    Physical Exam Vitals signs and nursing note reviewed.  Constitutional:      General: He is not in acute distress.    Appearance: Normal appearance. He is well-developed. He is not ill-appearing.  HENT:     Head: Normocephalic and atraumatic.     Right Ear: Hearing, tympanic membrane, ear canal and external ear normal.     Left Ear: Hearing, tympanic membrane, ear canal and external ear normal.     Nose: Nose normal.     Mouth/Throat:     Mouth: Mucous membranes are moist.     Pharynx: Uvula midline. No oropharyngeal exudate or posterior oropharyngeal erythema.  Eyes:     General: No scleral icterus.    Extraocular Movements: Extraocular movements intact.     Conjunctiva/sclera: Conjunctivae normal.     Pupils: Pupils are equal, round, and reactive to light.  Neck:     Musculoskeletal: Normal range of motion and neck supple.     Vascular: No carotid bruit.  Cardiovascular:     Rate and Rhythm: Normal rate and regular rhythm.      Pulses: Normal pulses.          Radial pulses are 2+ on the right side and 2+ on the left side.     Heart sounds: Normal heart sounds. No murmur.  Pulmonary:     Effort: Pulmonary effort is normal. No respiratory distress.     Breath sounds: Normal breath sounds. No wheezing, rhonchi or rales.  Abdominal:     General: Abdomen is flat. Bowel sounds are normal. There is no distension.     Palpations: Abdomen is soft. There is no mass.     Tenderness: There is no abdominal tenderness. There is no guarding or rebound.     Hernia: No hernia is present.  Genitourinary:    Comments: DRE - declined Musculoskeletal:  Normal range of motion.     Right lower leg: No edema.     Left lower leg: No edema.  Lymphadenopathy:     Cervical: No cervical adenopathy.  Skin:    General: Skin is warm and dry.     Findings: No rash.  Neurological:     General: No focal deficit present.     Mental Status: He is alert and oriented to person, place, and time.     Comments: CN grossly intact, station and gait intact  Psychiatric:        Mood and Affect: Mood normal.        Behavior: Behavior normal.        Thought Content: Thought content normal.        Judgment: Judgment normal.       Results for orders placed or performed in visit on 03/20/19  PSA, Medicare  Result Value Ref Range   PSA 1.56 0.10 - 4.00 ng/ml  Microalbumin / creatinine urine ratio  Result Value Ref Range   Microalb, Ur <0.7 0.0 - 1.9 mg/dL   Creatinine,U 136.3 mg/dL   Microalb Creat Ratio 0.5 0.0 - 30.0 mg/g  Vitamin B12  Result Value Ref Range   Vitamin B-12 402 211 - 911 pg/mL  CBC with Differential/Platelet  Result Value Ref Range   WBC 5.6 4.0 - 10.5 K/uL   RBC 4.22 4.22 - 5.81 Mil/uL   Hemoglobin 13.0 13.0 - 17.0 g/dL   HCT 37.9 (L) 39.0 - 52.0 %   MCV 89.8 78.0 - 100.0 fl   MCHC 34.2 30.0 - 36.0 g/dL   RDW 15.3 11.5 - 15.5 %   Platelets 215.0 150.0 - 400.0 K/uL   Neutrophils Relative % 65.5 43.0 - 77.0 %    Lymphocytes Relative 17.4 12.0 - 46.0 %   Monocytes Relative 11.8 3.0 - 12.0 %   Eosinophils Relative 4.5 0.0 - 5.0 %   Basophils Relative 0.8 0.0 - 3.0 %   Neutro Abs 3.6 1.4 - 7.7 K/uL   Lymphs Abs 1.0 0.7 - 4.0 K/uL   Monocytes Absolute 0.7 0.1 - 1.0 K/uL   Eosinophils Absolute 0.3 0.0 - 0.7 K/uL   Basophils Absolute 0.0 0.0 - 0.1 K/uL  Comprehensive metabolic panel  Result Value Ref Range   Sodium 140 135 - 145 mEq/L   Potassium 4.1 3.5 - 5.1 mEq/L   Chloride 105 96 - 112 mEq/L   CO2 27 19 - 32 mEq/L   Glucose, Bld 106 (H) 70 - 99 mg/dL   BUN 10 6 - 23 mg/dL   Creatinine, Ser 1.02 0.40 - 1.50 mg/dL   Total Bilirubin 1.0 0.2 - 1.2 mg/dL   Alkaline Phosphatase 56 39 - 117 U/L   AST 18 0 - 37 U/L   ALT 15 0 - 53 U/L   Total Protein 6.4 6.0 - 8.3 g/dL   Albumin 4.1 3.5 - 5.2 g/dL   Calcium 8.9 8.4 - 10.5 mg/dL   GFR 72.36 >60.00 mL/min  Lipid panel  Result Value Ref Range   Cholesterol 109 0 - 200 mg/dL   Triglycerides 173.0 (H) 0.0 - 149.0 mg/dL   HDL 40.60 >39.00 mg/dL   VLDL 34.6 0.0 - 40.0 mg/dL   LDL Cholesterol 34 0 - 99 mg/dL   Total CHOL/HDL Ratio 3    NonHDL 68.45    Assessment & Plan:   Problem List Items Addressed This Visit    Psoriasis    Chronic, followed  by derm. UVB light box at home.       Low vitamin B12 level    Continue monthly b12 shots.       HYPERTENSION, BENIGN ESSENTIAL    Chronic, stable. Continue current regimen.       Relevant Medications   amLODipine (NORVASC) 5 MG tablet   Healthcare maintenance    Preventative protocols reviewed and updated unless pt declined. Discussed healthy diet and lifestyle.       Dyslipidemia    Chronic, largely trig elevated. Continue colestipol for h/o bile salt-induced diarrhea.  The ASCVD Risk score Mikey Bussing DC Jr., et al., 2013) failed to calculate for the following reasons:   The valid total cholesterol range is 130 to 320 mg/dL       Crohn's ileocolitis s/p remote ileostomy    Stable period.        Bile salt-induced diarrhea    Continue colestipol.       Anemia    Continue b12 shot. He has chronically donated blood, latest high volume RBC donations. Will stop donating for the time being, reassess CBC next labs.      Advanced care planning/counseling discussion    Advanced directives: has at home.HCPOA is daughter. Will bring copy. Packet provided today.           Meds ordered this encounter  Medications  . amLODipine (NORVASC) 5 MG tablet    Sig: Take 1 tablet (5 mg total) by mouth daily.    Dispense:  90 tablet    Refill:  3   No orders of the defined types were placed in this encounter.   Follow up plan: Return in about 1 year (around 03/22/2020) for medicare wellness visit, annual exam, prior fasting for blood work.  Ria Bush, MD

## 2019-03-23 NOTE — Assessment & Plan Note (Signed)
Continue monthly b12 shots.

## 2019-03-23 NOTE — Assessment & Plan Note (Signed)
Stable period.

## 2019-03-23 NOTE — Assessment & Plan Note (Addendum)
Advanced directives: has at home.HCPOA is daughter. Will bring copy. Packet provided today.

## 2019-03-23 NOTE — Assessment & Plan Note (Signed)
Preventative protocols reviewed and updated unless pt declined. Discussed healthy diet and lifestyle.  

## 2019-03-23 NOTE — Assessment & Plan Note (Signed)
Continue colestipol.

## 2019-03-23 NOTE — Assessment & Plan Note (Signed)
Chronic, stable. Continue current regimen. 

## 2019-03-23 NOTE — Patient Instructions (Addendum)
Schedule vision exam.  If interested, check with pharmacy about new 2 shot shingles series (shingrix).  Bring [place] copy of your living will/advanced directives. Packet provided today.  You are doing well today. Continue current medicines. Return as needed or in 1 year for next wellness visit/physical.  Health Maintenance After Age 69 After age 63, you are at a higher risk for certain long-term diseases and infections as well as injuries from falls. Falls are a major cause of broken bones and head injuries in people who are older than age 31. Getting regular preventive care can help to keep you healthy and well. Preventive care includes getting regular testing and making lifestyle changes as recommended by your health care provider. Talk with your health care provider about:  Which screenings and tests you should have. A screening is a test that checks for a disease when you have no symptoms.  A diet and exercise plan that is right for you. What should I know about screenings and tests to prevent falls? Screening and testing are the best ways to find a health problem early. Early diagnosis and treatment give you the best chance of managing medical conditions that are common after age 69. Certain conditions and lifestyle choices may make you more likely to have a fall. Your health care provider may recommend:  Regular vision checks. Poor vision and conditions such as cataracts can make you more likely to have a fall. If you wear glasses, make sure to get your prescription updated if your vision changes.  Medicine review. Work with your health care provider to regularly review all of the medicines you are taking, including over-the-counter medicines. Ask your health care provider about any side effects that may make you more likely to have a fall. Tell your health care provider if any medicines that you take make you feel dizzy or sleepy.  Osteoporosis screening. Osteoporosis is a condition that  causes the bones to get weaker. This can make the bones weak and cause them to break more easily.  Blood pressure screening. Blood pressure changes and medicines to control blood pressure can make you feel dizzy.  Strength and balance checks. Your health care provider may recommend certain tests to check your strength and balance while standing, walking, or changing positions.  Foot health exam. Foot pain and numbness, as well as not wearing proper footwear, can make you more likely to have a fall.  Depression screening. You may be more likely to have a fall if you have a fear of falling, feel emotionally low, or feel unable to do activities that you used to do.  Alcohol use screening. Using too much alcohol can affect your balance and may make you more likely to have a fall. What actions can I take to lower my risk of falls? General instructions  Talk with your health care provider about your risks for falling. Tell your health care provider if: ? You fall. Be sure to tell your health care provider about all falls, even ones that seem minor. ? You feel dizzy, sleepy, or off-balance.  Take over-the-counter and prescription medicines only as told by your health care provider. These include any supplements.  Eat a healthy diet and maintain a healthy weight. A healthy diet includes low-fat dairy products, low-fat (lean) meats, and fiber from whole grains, beans, and lots of fruits and vegetables. Home safety  Remove any tripping hazards, such as rugs, cords, and clutter.  Install safety equipment such as grab bars in bathrooms  and safety rails on stairs.  Keep rooms and walkways well-lit. Activity   Follow a regular exercise program to stay fit. This will help you maintain your balance. Ask your health care provider what types of exercise are appropriate for you.  If you need a cane or walker, use it as recommended by your health care provider.  Wear supportive shoes that have nonskid  soles. Lifestyle  Do not drink alcohol if your health care provider tells you not to drink.  If you drink alcohol, limit how much you have: ? 0-1 drink a day for women. ? 0-2 drinks a day for men.  Be aware of how much alcohol is in your drink. In the [place] one drink equals one typical bottle of beer (12 oz), one-half glass of wine (5 oz), or one shot of hard liquor (1 oz).  Do not use any products that contain nicotine or tobacco, such as cigarettes and e-cigarettes. If you need help quitting, ask your health care provider. Summary  Having a healthy lifestyle and getting preventive care can help to protect your health and wellness after age 40.  Screening and testing are the best way to find a health problem early and help you avoid having a fall. Early diagnosis and treatment give you the best chance for managing medical conditions that are more common for people who are older than age 38.  Falls are a major cause of broken bones and head injuries in people who are older than age 65. Take precautions to prevent a fall at home.  Work with your health care provider to learn what changes you can make to improve your health and wellness and to prevent falls. This information is not intended to replace advice given to you by your health care provider. Make sure you discuss any questions you have with your health care provider. Document Released: 04/24/2017 Document Revised: 10/02/2018 Document Reviewed: 04/24/2017 Elsevier Patient Education  2020 [employer].

## 2019-04-02 DIAGNOSIS — G5793 Unspecified mononeuropathy of bilateral lower limbs: Secondary | ICD-10-CM | POA: Insufficient documentation

## 2019-04-15 ENCOUNTER — Telehealth: Payer: Self-pay | Admitting: Internal Medicine

## 2019-04-15 NOTE — Telephone Encounter (Signed)
Pt feels like he has a blockage in his gut somewhere. Would prefer not to come into office if there is something that he can take that will help.

## 2019-04-15 NOTE — Telephone Encounter (Signed)
Spoke with pt and he is aware.

## 2019-04-15 NOTE — Telephone Encounter (Signed)
Pt with history of bowel resection years ago and crohn's that he states has been doing well. He was at the beach a [place] days ago and said he started having the sensation that nothing was moving. He has passed no gas and had no BM and usually constipation is not an issue for him. He is not hungry but yesterday morning he ate a little breakfast and he felt bloated. Last night he vomited and it was everything he had eaten for breakfast. He reports he has no fever. He did not take his colestipol last night and states he is not in any distress. Discussed with him he may need an xray to see if he does have a blockage. Pt again said he was not in distress and will just stay on a liquid diet to see if things straighten out. Let him know this would be sent to Dr. Henrene Pastor for further recommendations. Please advise.

## 2019-04-15 NOTE — Telephone Encounter (Signed)
1.  Agree with holding colestipol for now 2.  He is at risk for partial bowel obstruction due to prior history of surgery.  Thus, if he has recurrent symptoms of abdominal discomfort with vomiting, he should be evaluated at the emergency room for x-rays to rule out obstruction. 3.  Keep [place] posted if he has any additional problems or questions

## 2019-05-12 ENCOUNTER — Ambulatory Visit (INDEPENDENT_AMBULATORY_CARE_PROVIDER_SITE_OTHER): Payer: Medicare Other

## 2019-05-12 DIAGNOSIS — E538 Deficiency of other specified B group vitamins: Secondary | ICD-10-CM | POA: Diagnosis not present

## 2019-05-12 MED ORDER — CYANOCOBALAMIN 1000 MCG/ML IJ SOLN
1000.0000 ug | Freq: Once | INTRAMUSCULAR | Status: AC
  Administered 2019-05-12: 10:00:00 1000 ug via INTRAMUSCULAR

## 2019-05-12 NOTE — Progress Notes (Signed)
Per orders of Dr. Ria Bush, injection of B12 given by Lurlean Nanny.  Patient tolerated injection well.

## 2019-06-30 ENCOUNTER — Other Ambulatory Visit: Payer: Self-pay

## 2019-06-30 ENCOUNTER — Ambulatory Visit (INDEPENDENT_AMBULATORY_CARE_PROVIDER_SITE_OTHER): Payer: Medicare PPO

## 2019-06-30 DIAGNOSIS — E538 Deficiency of other specified B group vitamins: Secondary | ICD-10-CM | POA: Diagnosis not present

## 2019-06-30 MED ORDER — CYANOCOBALAMIN 1000 MCG/ML IJ SOLN
1000.0000 ug | Freq: Once | INTRAMUSCULAR | Status: AC
  Administered 2019-06-30: 15:00:00 1000 ug via INTRAMUSCULAR

## 2019-06-30 NOTE — Progress Notes (Signed)
Pt received B12 injection in Right Deltoid. Tolerated well.

## 2019-07-22 ENCOUNTER — Telehealth: Payer: Self-pay | Admitting: Internal Medicine

## 2019-07-22 NOTE — Telephone Encounter (Signed)
Patient called in reference to his medication Colestipol has a few questions due to shortage.

## 2019-07-24 MED ORDER — COLESTIPOL HCL 1 G PO TABS: 4.0000 g | ORAL_TABLET | Freq: Every day | ORAL | 3 refills | Status: DC

## 2019-07-24 NOTE — Telephone Encounter (Signed)
Sent 90 day supply of Colestipol to [facility].  Patient aware

## 2019-08-03 ENCOUNTER — Ambulatory Visit: Payer: Medicare PPO

## 2019-08-18 ENCOUNTER — Other Ambulatory Visit: Payer: Self-pay

## 2019-08-18 ENCOUNTER — Ambulatory Visit (INDEPENDENT_AMBULATORY_CARE_PROVIDER_SITE_OTHER): Payer: Medicare PPO | Admitting: *Deleted

## 2019-08-18 DIAGNOSIS — E538 Deficiency of other specified B group vitamins: Secondary | ICD-10-CM | POA: Diagnosis not present

## 2019-08-18 MED ORDER — CYANOCOBALAMIN 1000 MCG/ML IJ SOLN
1000.0000 ug | Freq: Once | INTRAMUSCULAR | Status: AC
  Administered 2019-08-18: 1000 ug via INTRAMUSCULAR

## 2019-08-18 NOTE — Progress Notes (Signed)
Per orders of Dr. Einar Pheasant in Dr. Danise Mina absence , injection of Vit B12 given by Virl Cagey. Patient tolerated injection well.

## 2019-08-19 ENCOUNTER — Ambulatory Visit: Payer: Medicare PPO

## 2019-08-30 DIAGNOSIS — H903 Sensorineural hearing loss, bilateral: Secondary | ICD-10-CM | POA: Diagnosis not present

## 2019-10-01 DIAGNOSIS — G5793 Unspecified mononeuropathy of bilateral lower limbs: Secondary | ICD-10-CM | POA: Diagnosis not present

## 2019-10-01 DIAGNOSIS — L409 Psoriasis, unspecified: Secondary | ICD-10-CM | POA: Diagnosis not present

## 2019-10-01 DIAGNOSIS — M19041 Primary osteoarthritis, right hand: Secondary | ICD-10-CM | POA: Diagnosis not present

## 2019-10-01 DIAGNOSIS — M2042 Other hammer toe(s) (acquired), left foot: Secondary | ICD-10-CM | POA: Diagnosis not present

## 2019-10-01 DIAGNOSIS — M2041 Other hammer toe(s) (acquired), right foot: Secondary | ICD-10-CM | POA: Diagnosis not present

## 2019-12-08 ENCOUNTER — Ambulatory Visit (INDEPENDENT_AMBULATORY_CARE_PROVIDER_SITE_OTHER): Payer: Medicare PPO

## 2019-12-08 DIAGNOSIS — E538 Deficiency of other specified B group vitamins: Secondary | ICD-10-CM | POA: Diagnosis not present

## 2019-12-08 MED ORDER — CYANOCOBALAMIN 1000 MCG/ML IJ SOLN
1000.0000 ug | Freq: Once | INTRAMUSCULAR | Status: AC
  Administered 2019-12-08: 1000 ug via INTRAMUSCULAR

## 2019-12-08 NOTE — Progress Notes (Signed)
Per orders of Dr. Danise Mina, injection of B12 given by Randall An. Patient tolerated injection well.

## 2020-03-08 ENCOUNTER — Other Ambulatory Visit: Payer: Self-pay

## 2020-03-08 ENCOUNTER — Ambulatory Visit (INDEPENDENT_AMBULATORY_CARE_PROVIDER_SITE_OTHER): Payer: Medicare PPO

## 2020-03-08 DIAGNOSIS — E538 Deficiency of other specified B group vitamins: Secondary | ICD-10-CM

## 2020-03-08 DIAGNOSIS — Z23 Encounter for immunization: Secondary | ICD-10-CM | POA: Diagnosis not present

## 2020-03-08 MED ORDER — CYANOCOBALAMIN 1000 MCG/ML IJ SOLN
1000.0000 ug | Freq: Once | INTRAMUSCULAR | Status: AC
  Administered 2020-03-08: 1000 ug via INTRAMUSCULAR

## 2020-03-08 NOTE — Progress Notes (Signed)
Per orders of Dr. Danise Mina, injection of B12 given by Randall An. Patient tolerated injection well.

## 2020-03-22 ENCOUNTER — Telehealth: Payer: Self-pay | Admitting: Family Medicine

## 2020-03-22 NOTE — Telephone Encounter (Signed)
Pt called stating he was getting his booster covid and they wanted to know when his  Pneumonia and whooping cough vaccine was given

## 2020-03-22 NOTE — Telephone Encounter (Signed)
Spoke with pt with answers to his questions.  Pt expresses his thanks.

## 2020-03-29 ENCOUNTER — Encounter: Payer: Self-pay | Admitting: Dermatology

## 2020-03-29 ENCOUNTER — Other Ambulatory Visit: Payer: Self-pay

## 2020-03-29 ENCOUNTER — Ambulatory Visit (INDEPENDENT_AMBULATORY_CARE_PROVIDER_SITE_OTHER): Payer: Medicare PPO | Admitting: Dermatology

## 2020-03-29 DIAGNOSIS — L578 Other skin changes due to chronic exposure to nonionizing radiation: Secondary | ICD-10-CM | POA: Diagnosis not present

## 2020-03-29 DIAGNOSIS — D1800 Hemangioma unspecified site: Secondary | ICD-10-CM | POA: Diagnosis not present

## 2020-03-29 DIAGNOSIS — Z1283 Encounter for screening for malignant neoplasm of skin: Secondary | ICD-10-CM | POA: Diagnosis not present

## 2020-03-29 DIAGNOSIS — L409 Psoriasis, unspecified: Secondary | ICD-10-CM

## 2020-03-29 DIAGNOSIS — D229 Melanocytic nevi, unspecified: Secondary | ICD-10-CM

## 2020-03-29 DIAGNOSIS — Z85828 Personal history of other malignant neoplasm of skin: Secondary | ICD-10-CM

## 2020-03-29 DIAGNOSIS — L821 Other seborrheic keratosis: Secondary | ICD-10-CM | POA: Diagnosis not present

## 2020-03-29 DIAGNOSIS — L814 Other melanin hyperpigmentation: Secondary | ICD-10-CM | POA: Diagnosis not present

## 2020-03-29 NOTE — Patient Instructions (Signed)

## 2020-03-29 NOTE — Progress Notes (Signed)
Follow-Up Visit   Subjective  Paul Atkinson is a 69 y.o. male who presents for the following: Skin Problem (Pt presents for a new mole on his back he recently noticed, he would like his back checked today ). Pt with long history of Psoriasis on his hands, pt has a at home photo therapy he has used in the past. Pt with a history of pain he has sent to see a Rheumatologist in the past.  Patient here for full body skin exam and skin cancer screening.  The following portions of the chart were reviewed this encounter and updated as appropriate:  Tobacco  Allergies  Meds  Problems  Med Hx  Surg Hx  Fam Hx      Review of Systems:  No other skin or systemic complaints except as noted in HPI or Assessment and Plan.  Objective  Well appearing patient in no apparent distress; mood and affect are within normal limits.  A full examination was performed including scalp, head, eyes, ears, nose, lips, neck, chest, axillae, abdomen, back, buttocks, bilateral upper extremities, bilateral lower extremities, hands, feet, fingers, toes, fingernails, and toenails. All findings within normal limits unless otherwise noted below.  Objective  Mid Back: Stuck-on, waxy, tan-brown papules and plaques -- Discussed benign etiology and prognosis.   Objective  Back, chest: Well healed scar with no evidence of recurrence.   Objective  hands: Well-demarcated erythematous papules/plaques with silvery scale, guttate pink scaly papules.    Assessment & Plan  Seborrheic keratosis Mid Back  Benign-appearing.  Observation.  Call clinic for new or changing lesions.  Recommend daily use of broad spectrum spf 30+ sunscreen to sun-exposed areas.    History of basal cell carcinoma (BCC) Back, chest  Clear. Observe for recurrence. Call clinic for new or changing lesions.  Recommend regular skin exams, daily broad-spectrum spf 30+ sunscreen use, and photoprotection.     Psoriasis hands  Chronic,  flared Denies new or changing joint pain  Restart home phototherapy, pt already has at home   Sample of Lexette foam given use twice a day as needed flares. Avoid applying to face, groin, and axilla. Use as directed. Risk of skin atrophy with long-term use reviewed.   Start otc Vaseline use qhs   Pt will call if he need a rx of halobetasol foam sent to [facility]. If too expensive, can substitute Clobetasol foam. Pt has GoodRx coupon card.   We may consider R[place]ford Nail if no better in the future   Topical steroids (such as triamcinolone, fluocinolone, fluocinonide, mometasone, clobetasol, halobetasol, betamethasone, hydrocortisone) can cause thinning and lightening of the skin if they are used for too long in the same area. Your physician has selected the right strength medicine for your problem and area affected on the body. Please use your medication only as directed by your physician to prevent side effects.       Lentigines - Scattered tan macules - Discussed due to sun exposure - Benign, observe - Call for any changes  Seborrheic Keratoses - Stuck-on, waxy, tan-brown papules and plaques  - Discussed benign etiology and prognosis. - Observe - Call for any changes  Melanocytic Nevi - Tan-brown and/or pink-flesh-colored symmetric macules and papules - Benign appearing on exam today - Observation - Call clinic for new or changing moles - Recommend daily use of broad spectrum spf 30+ sunscreen to sun-exposed areas.   Hemangiomas - Red papules - Discussed benign nature - Observe - Call for any changes  Actinic  Damage - diffuse scaly erythematous macules with underlying dyspigmentation - Recommend daily broad spectrum sunscreen SPF 30+ to sun-exposed areas, reapply every 2 hours as needed.  - Call for new or changing lesions.  Skin cancer screening performed today.  Return in about 1 year (around 03/29/2021) for TBSE .  I, Marye Round, CMA, am acting as scribe for Forest Gleason, MD .  Documentation: I have reviewed the above documentation for accuracy and completeness, and I agree with the above.  Forest Gleason, MD

## 2020-04-17 ENCOUNTER — Encounter: Payer: Self-pay | Admitting: Dermatology

## 2020-04-24 ENCOUNTER — Other Ambulatory Visit: Payer: Self-pay | Admitting: Family Medicine

## 2020-04-24 DIAGNOSIS — Z125 Encounter for screening for malignant neoplasm of prostate: Secondary | ICD-10-CM

## 2020-04-24 DIAGNOSIS — E785 Hyperlipidemia, unspecified: Secondary | ICD-10-CM

## 2020-04-24 DIAGNOSIS — I10 Essential (primary) hypertension: Secondary | ICD-10-CM

## 2020-04-24 DIAGNOSIS — E538 Deficiency of other specified B group vitamins: Secondary | ICD-10-CM

## 2020-04-24 DIAGNOSIS — K5080 Crohn's disease of both small and large intestine without complications: Secondary | ICD-10-CM

## 2020-04-24 DIAGNOSIS — D649 Anemia, unspecified: Secondary | ICD-10-CM

## 2020-04-26 ENCOUNTER — Other Ambulatory Visit (INDEPENDENT_AMBULATORY_CARE_PROVIDER_SITE_OTHER): Payer: Medicare PPO

## 2020-04-26 ENCOUNTER — Other Ambulatory Visit: Payer: Self-pay

## 2020-04-26 DIAGNOSIS — D649 Anemia, unspecified: Secondary | ICD-10-CM | POA: Diagnosis not present

## 2020-04-26 DIAGNOSIS — E785 Hyperlipidemia, unspecified: Secondary | ICD-10-CM

## 2020-04-26 DIAGNOSIS — I10 Essential (primary) hypertension: Secondary | ICD-10-CM

## 2020-04-26 DIAGNOSIS — Z125 Encounter for screening for malignant neoplasm of prostate: Secondary | ICD-10-CM | POA: Diagnosis not present

## 2020-04-26 DIAGNOSIS — K5080 Crohn's disease of both small and large intestine without complications: Secondary | ICD-10-CM

## 2020-04-26 DIAGNOSIS — E538 Deficiency of other specified B group vitamins: Secondary | ICD-10-CM

## 2020-04-26 LAB — COMPREHENSIVE METABOLIC PANEL
ALT: 12 U/L (ref 0–53)
AST: 17 U/L (ref 0–37)
Albumin: 4.4 g/dL (ref 3.5–5.2)
Alkaline Phosphatase: 70 U/L (ref 39–117)
BUN: 15 mg/dL (ref 6–23)
CO2: 30 mEq/L (ref 19–32)
Calcium: 9.2 mg/dL (ref 8.4–10.5)
Chloride: 101 mEq/L (ref 96–112)
Creatinine, Ser: 1.04 mg/dL (ref 0.40–1.50)
GFR: 72.84 mL/min (ref >60.00)
Glucose, Bld: 101 mg/dL — ABNORMAL HIGH (ref 70–99)
Potassium: 4.2 mEq/L (ref 3.5–5.1)
Sodium: 137 mEq/L (ref 135–145)
Total Bilirubin: 1.4 mg/dL — ABNORMAL HIGH (ref 0.2–1.2)
Total Protein: 7 g/dL (ref 6.0–8.3)

## 2020-04-26 LAB — LIPID PANEL
Cholesterol: 128 mg/dL (ref 0–200)
HDL: 44.7 mg/dL (ref >39.00)
LDL Cholesterol: 48 mg/dL (ref 0–99)
NonHDL: 83.18
Total CHOL/HDL Ratio: 3
Triglycerides: 177 mg/dL — ABNORMAL HIGH (ref 0.0–149.0)
VLDL: 35.4 mg/dL (ref 0.0–40.0)

## 2020-04-26 LAB — VITAMIN B12: Vitamin B-12: 246 pg/mL (ref 211–911)

## 2020-04-26 LAB — CBC WITH DIFFERENTIAL/PLATELET
Basophils Absolute: 0 10*3/uL (ref 0.0–0.1)
Basophils Relative: 0.9 % (ref 0.0–3.0)
Eosinophils Absolute: 0.3 10*3/uL (ref 0.0–0.7)
Eosinophils Relative: 5 % (ref 0.0–5.0)
HCT: 43.4 % (ref 39.0–52.0)
Hemoglobin: 15.2 g/dL (ref 13.0–17.0)
Lymphocytes Relative: 20.7 % (ref 12.0–46.0)
Lymphs Abs: 1.1 10*3/uL (ref 0.7–4.0)
MCHC: 35.1 g/dL (ref 30.0–36.0)
MCV: 91.3 fl (ref 78.0–100.0)
Monocytes Absolute: 0.7 10*3/uL (ref 0.1–1.0)
Monocytes Relative: 12.9 % — ABNORMAL HIGH (ref 3.0–12.0)
Neutro Abs: 3.1 10*3/uL (ref 1.4–7.7)
Neutrophils Relative %: 60.5 % (ref 43.0–77.0)
Platelets: 246 10*3/uL (ref 150.0–400.0)
RBC: 4.75 Mil/uL (ref 4.22–5.81)
RDW: 12.8 % (ref 11.5–15.5)
WBC: 5.1 10*3/uL (ref 4.0–10.5)

## 2020-04-26 LAB — MICROALBUMIN / CREATININE URINE RATIO
Creatinine,U: 123.3 mg/dL
Microalb Creat Ratio: 0.6 mg/g (ref 0.0–30.0)
Microalb, Ur: <0.7 mg/dL (ref 0.0–1.9)

## 2020-04-26 LAB — PSA: PSA: 1.77 ng/mL (ref 0.10–4.00)

## 2020-05-03 ENCOUNTER — Ambulatory Visit (INDEPENDENT_AMBULATORY_CARE_PROVIDER_SITE_OTHER): Payer: Medicare PPO | Admitting: Family Medicine

## 2020-05-03 ENCOUNTER — Encounter: Payer: Self-pay | Admitting: Family Medicine

## 2020-05-03 ENCOUNTER — Other Ambulatory Visit: Payer: Self-pay

## 2020-05-03 VITALS — BP 120/76 | HR 75 | Temp 98.1°F | Ht 71.0 in | Wt 173.4 lb

## 2020-05-03 DIAGNOSIS — Z0000 Encounter for general adult medical examination without abnormal findings: Secondary | ICD-10-CM | POA: Diagnosis not present

## 2020-05-03 DIAGNOSIS — Z7189 Other specified counseling: Secondary | ICD-10-CM

## 2020-05-03 DIAGNOSIS — M79672 Pain in left foot: Secondary | ICD-10-CM

## 2020-05-03 DIAGNOSIS — L409 Psoriasis, unspecified: Secondary | ICD-10-CM

## 2020-05-03 DIAGNOSIS — M25511 Pain in right shoulder: Secondary | ICD-10-CM | POA: Insufficient documentation

## 2020-05-03 DIAGNOSIS — M79671 Pain in right foot: Secondary | ICD-10-CM

## 2020-05-03 DIAGNOSIS — G8929 Other chronic pain: Secondary | ICD-10-CM

## 2020-05-03 DIAGNOSIS — E785 Hyperlipidemia, unspecified: Secondary | ICD-10-CM

## 2020-05-03 DIAGNOSIS — M79673 Pain in unspecified foot: Secondary | ICD-10-CM | POA: Insufficient documentation

## 2020-05-03 DIAGNOSIS — M25512 Pain in left shoulder: Secondary | ICD-10-CM | POA: Insufficient documentation

## 2020-05-03 DIAGNOSIS — K5080 Crohn's disease of both small and large intestine without complications: Secondary | ICD-10-CM

## 2020-05-03 DIAGNOSIS — D649 Anemia, unspecified: Secondary | ICD-10-CM

## 2020-05-03 DIAGNOSIS — K9089 Other intestinal malabsorption: Secondary | ICD-10-CM

## 2020-05-03 DIAGNOSIS — E538 Deficiency of other specified B group vitamins: Secondary | ICD-10-CM

## 2020-05-03 DIAGNOSIS — I10 Essential (primary) hypertension: Secondary | ICD-10-CM

## 2020-05-03 MED ORDER — AMLODIPINE BESYLATE 5 MG PO TABS
5.0000 mg | ORAL_TABLET | Freq: Every day | ORAL | 3 refills | Status: DC
Start: 2020-05-03 — End: 2021-05-16

## 2020-05-03 NOTE — Assessment & Plan Note (Addendum)
Anemia has resolved, previously attributed to chronic blood donation - previously large amount donor. Considering donating platelets.

## 2020-05-03 NOTE — Patient Instructions (Addendum)
Bring [place] copy of advanced directive.  Schedule eye exam.  Try metatarsal pad to left shoe to see if any benefit.  You are doing well today  Return as needed or in 1 year for next physical.   Health Maintenance After Age 70 After age 9, you are at a higher risk for certain long-term diseases and infections as well as injuries from falls. Falls are a major cause of broken bones and head injuries in people who are older than age 43. Getting regular preventive care can help to keep you healthy and well. Preventive care includes getting regular testing and making lifestyle changes as recommended by your health care provider. Talk with your health care provider about:  Which screenings and tests you should have. A screening is a test that checks for a disease when you have no symptoms.  A diet and exercise plan that is right for you. What should I know about screenings and tests to prevent falls? Screening and testing are the best ways to find a health problem early. Early diagnosis and treatment give you the best chance of managing medical conditions that are common after age 68. Certain conditions and lifestyle choices may make you more likely to have a fall. Your health care provider may recommend:  Regular vision checks. Poor vision and conditions such as cataracts can make you more likely to have a fall. If you wear glasses, make sure to get your prescription updated if your vision changes.  Medicine review. Work with your health care provider to regularly review all of the medicines you are taking, including over-the-counter medicines. Ask your health care provider about any side effects that may make you more likely to have a fall. Tell your health care provider if any medicines that you take make you feel dizzy or sleepy.  Osteoporosis screening. Osteoporosis is a condition that causes the bones to get weaker. This can make the bones weak and cause them to break more easily.  Blood pressure  screening. Blood pressure changes and medicines to control blood pressure can make you feel dizzy.  Strength and balance checks. Your health care provider may recommend certain tests to check your strength and balance while standing, walking, or changing positions.  Foot health exam. Foot pain and numbness, as well as not wearing proper footwear, can make you more likely to have a fall.  Depression screening. You may be more likely to have a fall if you have a fear of falling, feel emotionally low, or feel unable to do activities that you used to do.  Alcohol use screening. Using too much alcohol can affect your balance and may make you more likely to have a fall. What actions can I take to lower my risk of falls? General instructions  Talk with your health care provider about your risks for falling. Tell your health care provider if: ? You fall. Be sure to tell your health care provider about all falls, even ones that seem minor. ? You feel dizzy, sleepy, or off-balance.  Take over-the-counter and prescription medicines only as told by your health care provider. These include any supplements.  Eat a healthy diet and maintain a healthy weight. A healthy diet includes low-fat dairy products, low-fat (lean) meats, and fiber from whole grains, beans, and lots of fruits and vegetables. Home safety  Remove any tripping hazards, such as rugs, cords, and clutter.  Install safety equipment such as grab bars in bathrooms and safety rails on stairs.  Keep rooms  and walkways well-lit. Activity   Follow a regular exercise program to stay fit. This will help you maintain your balance. Ask your health care provider what types of exercise are appropriate for you.  If you need a cane or walker, use it as recommended by your health care provider.  Wear supportive shoes that have nonskid soles. Lifestyle  Do not drink alcohol if your health care provider tells you not to drink.  If you drink  alcohol, limit how much you have: ? 0-1 drink a day for women. ? 0-2 drinks a day for men.  Be aware of how much alcohol is in your drink. In the [place] one drink equals one typical bottle of beer (12 oz), one-half glass of wine (5 oz), or one shot of hard liquor (1 oz).  Do not use any products that contain nicotine or tobacco, such as cigarettes and e-cigarettes. If you need help quitting, ask your health care provider. Summary  Having a healthy lifestyle and getting preventive care can help to protect your health and wellness after age 6.  Screening and testing are the best way to find a health problem early and help you avoid having a fall. Early diagnosis and treatment give you the best chance for managing medical conditions that are more common for people who are older than age 30.  Falls are a major cause of broken bones and head injuries in people who are older than age 3. Take precautions to prevent a fall at home.  Work with your health care provider to learn what changes you can make to improve your health and wellness and to prevent falls. This information is not intended to replace advice given to you by your health care provider. Make sure you discuss any questions you have with your health care provider. Document Revised: 10/02/2018 Document Reviewed: 04/24/2017 Elsevier Patient Education  2020 [employer].

## 2020-05-03 NOTE — Assessment & Plan Note (Signed)
Preventative protocols reviewed and updated unless pt declined. Discussed healthy diet and lifestyle.  

## 2020-05-03 NOTE — Assessment & Plan Note (Signed)
Chronic, followed by derm.  Finds UVB light box most effective (has one at home)

## 2020-05-03 NOTE — Assessment & Plan Note (Signed)
Advanced directives: has at home.HCPOA isdaughter.Will bring copy. New packet provided today.

## 2020-05-03 NOTE — Assessment & Plan Note (Signed)
Stable period on colestipol daily followed by GI

## 2020-05-03 NOTE — Assessment & Plan Note (Signed)
Regularly sees GI.

## 2020-05-03 NOTE — Assessment & Plan Note (Addendum)
Chronic, stable, only on colestipol  The ASCVD Risk score Mikey Bussing DC Jr., et al., 2013) failed to calculate for the following reasons:   The valid total cholesterol range is 130 to 320 mg/dL

## 2020-05-03 NOTE — Assessment & Plan Note (Signed)

## 2020-05-03 NOTE — Assessment & Plan Note (Signed)
Overall reassuring exam. ?osteoarthritis related. Will monitor for now

## 2020-05-03 NOTE — Progress Notes (Signed)
This visit was conducted in person.  BP 120/76 (BP Location: Left Arm, Patient Position: Sitting, Cuff Size: Normal)   Pulse 75   Temp 98.1 F (36.7 C) (Temporal)   Ht 5' 11"  (1.803 m)   Wt 173 lb 7 oz (78.7 kg)   SpO2 98%   BMI 24.19 kg/m   BP Readings from Last 3 Encounters:  05/03/20 120/76  03/23/19 138/82  03/19/18 120/78    CC: AMW Subjective:    Patient ID: Paul Atkinson, male    DOB: Oct 04, 1950, 69 y.o.   MRN: [id number]  HPI: Paul Atkinson is a 69 y.o. male presenting on 05/03/2020 for Medicare Wellness (Wants to discuss BP meds. )   Did not see health advisor this year.   Hearing Screening   125Hz  250Hz  500Hz  1000Hz  2000Hz  3000Hz  4000Hz  6000Hz  8000Hz   Right ear:           Left ear:           Comments: Wears bilateral hearing aids.  Wearing at today's OV.   Visual Acuity Screening   Right eye Left eye Both eyes  Without correction: 20/50 20/40 20/40   With correction:         Office Visit from 05/03/2020 in [place] at [facility] Total Score 0      Fall Risk  05/03/2020 03/20/2019 03/13/2018 03/12/2017 10/04/2015  Falls in the past year? 0 0 No No No  Number falls in past yr: - 0 - - -  Risk for fall due to : - Medication side effect - - -  Follow up - Falls evaluation completed;Falls prevention discussed - - -      Ran out of BP med last Wednesday. BP has been staying stable.  7 lb weight loss in the past year - eating at home more.   H/o crohn's ileocolitis s/p ilocecectomy remotely - receives b12 shots monthly. Persistently low levels despite this. Sees Dr Henrene Pastor yearly  Bile salt induced diarrhea - on colestipol.  Has seen rheumatologist - referred by derm for further eval of chronic psoriasis. Tested negative for psoriatic arthritis. Intermittent lower back pain managed with minimal tylenol and advil.   Left 2nd-4th toe discomfort intermittently - considering seeing orthopedist - doesn't want to see podiatrist.   Notes some  discomfort to bilateral shoulders worse with overhead movement.   Preventative: Colonoscopy -07/2016, stable.patent anastomosis.Rec rpt 5 years (Dr. Henrene Pastor) Prostate cancer screening - minor sxs, attributed to enlarged prostate.Nocturia x1.continue monitoring PSA Flu shot yearly Pneumovax 02/2017, prevnar 09/2015  Td 2012  [place] 06/2019, 07/2019, 02/2020 zostavax 2012  shingrix - completed 2021 Advanced directives: has at home.HCPOA isdaughter.Will bring copy. New packet provided today.  Seat belt use discussed.  Sunscreen use discussed. No changing moles on skin. Sees derm yearly.  Non smoker  Alcohol -none  Dentist Q6 mo  Eye exam - yearly. H/o iritis remotely - overdue for this (previusly saw Dr Tobe Sos) Bowel - no constipation - bile salt diarrhea managed with colestipol  Bladder - no incontinence   Occasional caffeine  Lives alone (widower 2019, lost wife to colon cancer), no pets  Occupation: retired, International aid/development worker  Activity: no regular exercise but stays active  Diet: good water, fruits/vegetables some     Relevant past medical, surgical, family and social history reviewed and updated as indicated. Interim medical history since our last visit reviewed. Allergies and medications reviewed and updated. Outpatient Medications Prior to Visit  Medication  Sig Dispense Refill  . colestipol (COLESTID) 1 g tablet Take 4 tablets (4 g total) by mouth daily. 360 tablet 3  . cyanocobalamin (,VITAMIN B-12,) 1000 MCG/ML injection Inject 1,000 mcg into the muscle every 30 (thirty) days. Chronic therapy    . docusate sodium (COLACE) 100 MG capsule Take 1 capsule (100 mg total) by mouth daily. (Patient taking differently: Take 100 mg by mouth daily as needed. ) 10 capsule 0  . fluticasone (FLONASE) 50 MCG/ACT nasal spray Place 1 spray into both nostrils daily.     Marland Kitchen amLODipine (NORVASC) 5 MG tablet Take 1 tablet (5 mg total) by mouth daily. 90 tablet 3    Facility-Administered Medications Prior to Visit  Medication Dose Route Frequency Provider Last Rate Last Admin  . 0.9 %  sodium chloride infusion  500 mL Intravenous Continuous Irene Shipper, MD         Per HPI unless specifically indicated in ROS section below Review of Systems  Constitutional: Negative for activity change, appetite change, chills, fatigue, fever and unexpected weight change.  HENT: Negative for hearing loss.   Eyes: Negative for visual disturbance.  Respiratory: Negative for cough, chest tightness, shortness of breath and wheezing.   Cardiovascular: Negative for chest pain, palpitations and leg swelling.  Gastrointestinal: Negative for abdominal distention, abdominal pain, blood in stool, constipation, diarrhea, nausea and vomiting.  Genitourinary: Negative for difficulty urinating and hematuria.  Musculoskeletal: Negative for arthralgias, myalgias and neck pain.  Skin: Negative for rash.  Neurological: Negative for dizziness, seizures, syncope and headaches.  Hematological: Negative for adenopathy. Does not bruise/bleed easily.  Psychiatric/Behavioral: Negative for dysphoric mood. The patient is not nervous/anxious.    Objective:  BP 120/76 (BP Location: Left Arm, Patient Position: Sitting, Cuff Size: Normal)   Pulse 75   Temp 98.1 F (36.7 C) (Temporal)   Ht 5' 11"  (1.803 m)   Wt 173 lb 7 oz (78.7 kg)   SpO2 98%   BMI 24.19 kg/m   Wt Readings from Last 3 Encounters:  05/03/20 173 lb 7 oz (78.7 kg)  03/23/19 180 lb 4 oz (81.8 kg)  03/20/19 176 lb (79.8 kg)      Physical Exam Vitals and nursing note reviewed.  Constitutional:      General: He is not in acute distress.    Appearance: Normal appearance. He is well-developed. He is not ill-appearing.  HENT:     Head: Normocephalic and atraumatic.     Right Ear: Hearing, tympanic membrane, ear canal and external ear normal.     Left Ear: Hearing, tympanic membrane, ear canal and external ear normal.   Eyes:     General: No scleral icterus.    Extraocular Movements: Extraocular movements intact.     Conjunctiva/sclera: Conjunctivae normal.     Pupils: Pupils are equal, round, and reactive to light.  Neck:     Thyroid: No thyroid mass or thyromegaly.     Vascular: No carotid bruit.  Cardiovascular:     Rate and Rhythm: Normal rate and regular rhythm.     Pulses: Normal pulses.          Radial pulses are 2+ on the right side and 2+ on the left side.     Heart sounds: Normal heart sounds. No murmur heard.   Pulmonary:     Effort: Pulmonary effort is normal. No respiratory distress.     Breath sounds: Normal breath sounds. No wheezing, rhonchi or rales.  Abdominal:     General:  Abdomen is flat. Bowel sounds are normal. There is no distension.     Palpations: Abdomen is soft.     Tenderness: There is no abdominal tenderness. There is no guarding or rebound.  Musculoskeletal:        General: No tenderness. Normal range of motion.     Cervical back: Normal range of motion and neck supple.     Right lower leg: No edema.     Left lower leg: No edema.     Comments:  Bilateral shoulder exam: No deformity of shoulders on inspection. No pain with palpation of shoulder landmarks. FROM in abduction and forward flexion, discomfort reproduced with fully raising arms. No pain or weakness with testing SITS in ext/int rotation. No pain with empty can sign. Neg Speed test. No impingement. No pain with rotation of humeral head in [facility] joint.   L foot with mild collapse of metatarsal heads/arch with beginnings of hammer toe to 2nd toe  Lymphadenopathy:     Cervical: No cervical adenopathy.  Skin:    General: Skin is warm and dry.     Findings: No rash.  Neurological:     General: No focal deficit present.     Mental Status: He is alert and oriented to person, place, and time.     Comments:  CN grossly intact, station and gait intact Recall 3/3 Calculation 5/5 DLROW  Psychiatric:         Mood and Affect: Mood normal.        Behavior: Behavior normal.        Thought Content: Thought content normal.        Judgment: Judgment normal.       Results for orders placed or performed in visit on 04/26/20  PSA  Result Value Ref Range   PSA 1.77 0.10 - 4.00 ng/mL  Vitamin B12  Result Value Ref Range   Vitamin B-12 246 211 - 911 pg/mL  Microalbumin / creatinine urine ratio  Result Value Ref Range   Microalb, Ur <0.7 0.0 - 1.9 mg/dL   Creatinine,U 123.3 mg/dL   Microalb Creat Ratio 0.6 0.0 - 30.0 mg/g  CBC with Differential/Platelet  Result Value Ref Range   WBC 5.1 4.0 - 10.5 K/uL   RBC 4.75 4.22 - 5.81 Mil/uL   Hemoglobin 15.2 13.0 - 17.0 g/dL   HCT 43.4 39 - 52 %   MCV 91.3 78.0 - 100.0 fl   MCHC 35.1 30.0 - 36.0 g/dL   RDW 12.8 11.5 - 15.5 %   Platelets 246.0 150 - 400 K/uL   Neutrophils Relative % 60.5 43 - 77 %   Lymphocytes Relative 20.7 12 - 46 %   Monocytes Relative 12.9 (H) 3 - 12 %   Eosinophils Relative 5.0 0 - 5 %   Basophils Relative 0.9 0 - 3 %   Neutro Abs 3.1 1.4 - 7.7 K/uL   Lymphs Abs 1.1 0.7 - 4.0 K/uL   Monocytes Absolute 0.7 0.1 - 1.0 K/uL   Eosinophils Absolute 0.3 0.0 - 0.7 K/uL   Basophils Absolute 0.0 0.0 - 0.1 K/uL  Lipid panel  Result Value Ref Range   Cholesterol 128 0 - 200 mg/dL   Triglycerides 177.0 (H) 0 - 149 mg/dL   HDL 44.70 >39.00 mg/dL   VLDL 35.4 0.0 - 40.0 mg/dL   LDL Cholesterol 48 0 - 99 mg/dL   Total CHOL/HDL Ratio 3    NonHDL 83.18   Comprehensive metabolic panel  Result Value  Ref Range   Sodium 137 135 - 145 mEq/L   Potassium 4.2 3.5 - 5.1 mEq/L   Chloride 101 96 - 112 mEq/L   CO2 30 19 - 32 mEq/L   Glucose, Bld 101 (H) 70 - 99 mg/dL   BUN 15 6 - 23 mg/dL   Creatinine, Ser 1.04 0.40 - 1.50 mg/dL   Total Bilirubin 1.4 (H) 0.2 - 1.2 mg/dL   Alkaline Phosphatase 70 39 - 117 U/L   AST 17 0 - 37 U/L   ALT 12 0 - 53 U/L   Total Protein 7.0 6.0 - 8.3 g/dL   Albumin 4.4 3.5 - 5.2 g/dL   GFR 72.84 >60.00 mL/min    Calcium 9.2 8.4 - 10.5 mg/dL   Assessment & Plan:  This visit occurred during the SARS-CoV-2 public health emergency.  Safety protocols were in place, including screening questions prior to the visit, additional usage of staff PPE, and extensive cleaning of exam room while observing appropriate contact time as indicated for disinfecting solutions.   Problem List Items Addressed This Visit    Vitamin B12 deficiency    Continue monthly B12 shots. He has not been regular with monthly shots - will renew efforts.       Shoulder pain, bilateral    Overall reassuring exam. ?osteoarthritis related. Will monitor for now       Psoriasis    Chronic, followed by derm.  Finds UVB light box most effective (has one at home)      Medicare annual wellness visit, subsequent - Primary    I have personally reviewed the Medicare Annual Wellness questionnaire and have noted 1. The patient's medical and social history 2. Their use of alcohol, tobacco or illicit drugs 3. Their current medications and supplements 4. The patient's functional ability including ADL's, fall risks, home safety risks and hearing or visual impairment. Cognitive function has been assessed and addressed as indicated.  5. Diet and physical activity 6. Evidence for depression or mood disorders The patients weight, height, BMI have been recorded in the chart. I have made referrals, counseling and provided education to the patient based on review of the above and I have provided the pt with a written personalized care plan for preventive services. Provider list updated.. See scanned questionairre as needed for further documentation. Reviewed preventative protocols and updated unless pt declined.       HYPERTENSION, BENIGN ESSENTIAL    Chronic, overall stable on amlodipine 53m daily - continue.       Relevant Medications   amLODipine (NORVASC) 5 MG tablet   Healthcare maintenance    Preventative protocols reviewed and updated  unless pt declined. Discussed healthy diet and lifestyle.       Foot pain    L>R discomfort. Possibly related to mild collapse of transverse arch - suggested metatarsal pad and discussed placement - he will look for this over the counter.       Dyslipidemia    Chronic, stable, only on colestipol  The ASCVD Risk score (Mikey BussingDC Jr., et al., 2013) failed to calculate for the following reasons:   The valid total cholesterol range is 130 to 320 mg/dL       Crohn's ileocolitis s/p remote ileostomy    Regularly sees GI.       Bile salt-induced diarrhea    Stable period on colestipol daily followed by GI      Anemia    Anemia has resolved, previously attributed to chronic blood donation -  previously large amount donor. Considering donating platelets.       Advanced care planning/counseling discussion    Advanced directives: has at home.HCPOA isdaughter.Will bring copy. New packet provided today.           Meds ordered this encounter  Medications  . amLODipine (NORVASC) 5 MG tablet    Sig: Take 1 tablet (5 mg total) by mouth daily.    Dispense:  90 tablet    Refill:  3   No orders of the defined types were placed in this encounter.   Patient instructions: Bring [place] copy of advanced directive.  Schedule eye exam.  Try metatarsal pad to left shoe to see if any benefit.  You are doing well today  Return as needed or in 1 year for next physical.   Follow up plan: Return in about 1 year (around 05/03/2021), or if symptoms worsen or fail to improve, for annual exam, prior fasting for blood work.  Ria Bush, MD

## 2020-05-03 NOTE — Assessment & Plan Note (Signed)
Continue monthly B12 shots. He has not been regular with monthly shots - will renew efforts.

## 2020-05-03 NOTE — Assessment & Plan Note (Signed)
Chronic, overall stable on amlodipine 29m daily - continue.

## 2020-05-03 NOTE — Assessment & Plan Note (Signed)
L>R discomfort. Possibly related to mild collapse of transverse arch - suggested metatarsal pad and discussed placement - he will look for this over the counter.

## 2020-05-12 ENCOUNTER — Other Ambulatory Visit: Payer: Self-pay

## 2020-05-12 ENCOUNTER — Ambulatory Visit (INDEPENDENT_AMBULATORY_CARE_PROVIDER_SITE_OTHER): Payer: Medicare PPO

## 2020-05-12 DIAGNOSIS — E538 Deficiency of other specified B group vitamins: Secondary | ICD-10-CM

## 2020-05-12 MED ORDER — CYANOCOBALAMIN 1000 MCG/ML IJ SOLN
1000.0000 ug | Freq: Once | INTRAMUSCULAR | Status: AC
  Administered 2020-05-12: 1000 ug via INTRAMUSCULAR

## 2020-05-12 NOTE — Progress Notes (Addendum)
Per orders of Guiterrez, injection of B12, given by Aneta Mins, RN. Patient tolerated injection well in R Deltoid.    Agree.  Thanks.  Elsie Stain

## 2020-05-30 ENCOUNTER — Ambulatory Visit: Payer: Medicare PPO | Admitting: Internal Medicine

## 2020-05-30 ENCOUNTER — Encounter: Payer: Self-pay | Admitting: Internal Medicine

## 2020-05-30 VITALS — BP 120/70 | HR 87 | Ht 71.0 in | Wt 174.0 lb

## 2020-05-30 DIAGNOSIS — K9089 Other intestinal malabsorption: Secondary | ICD-10-CM | POA: Diagnosis not present

## 2020-05-30 DIAGNOSIS — K5000 Crohn's disease of small intestine without complications: Secondary | ICD-10-CM | POA: Diagnosis not present

## 2020-05-30 DIAGNOSIS — E538 Deficiency of other specified B group vitamins: Secondary | ICD-10-CM

## 2020-05-30 MED ORDER — COLESTIPOL HCL 1 G PO TABS
4.0000 g | ORAL_TABLET | Freq: Every day | ORAL | 3 refills | Status: DC
Start: 2020-05-30 — End: 2021-07-14

## 2020-05-30 NOTE — Patient Instructions (Signed)
We have sent the following medications to your pharmacy for you to pick up at your convenience:  Colestid  Please follow up in one year

## 2020-05-30 NOTE — Progress Notes (Signed)
HISTORY OF PRESENT ILLNESS:  Paul Atkinson is a 69 y.o. male with a history of Crohn's ileocolitis for which he is status post remote ileocecectomy at [facility] greater than 20 years ago.  He has a history of postoperative bile salt induced diarrhea for which he is on Colestid as well as B12 deficiency for which he is on replacement therapy.  He was last seen virtually Oct 29, 2018 for routine follow-up.  He was doing well at that time.  He presents today for clinical follow-up.  He continues on Colestid which controls his diarrhea nicely.  As well B12 replacement.  Recent blood work from April 26, 2020 reveals unremarkable comprehensive metabolic panel.  Normal CBC with hemoglobin 15.2.  Normal B12 level.  His GI review of systems is unremarkable.  He does tell me that his sisters died of rectal cancer.  Patient's last routine colonoscopy was February 2018.  He is due for his surveillance examination around February 2023.  He does request refill of his Colestid.  He has completed his Covid vaccination series  REVIEW OF SYSTEMS:  All non-GI ROS negative as otherwise stated in the HPI.  Past Medical History:  Diagnosis Date  . Anemia    iron def  . Basal cell carcinoma    back, chest   . Bile salt-induced diarrhea   . Crohn disease ([place])    status post ileocecectomy 1989  . Disorder of inner ear   . Hearing loss   . Hx of duodenal ulcer   . Hx SBO   . Hypertension   . Malabsorption   . Psoriasis   . Ulcer   . Vitamin B 12 deficiency    secondary to ileal resection    Past Surgical History:  Procedure Laterality Date  . COLONOSCOPY  07/2016   patent anastomosis, no polyps, rpt 5 yrs Henrene Pastor)  . ILEOCECETOMY  1989   peritonitis with SBO from crohn's  . MOLE REMOVAL     back  . MOUTH SURGERY    . SEPTOPLASTY  05/2004   due to deviated septum (Dr. Myna Hidalgo)    Social History [name]  reports that he has never smoked. He has never used smokeless tobacco. He reports that he  does not drink alcohol and does not use drugs.  family history includes CAD (age of onset: 39) in his father; Diabetes (age of onset: 36) in his father; Heart disease in his mother; Hyperlipidemia in his father; Hypertension in his mother; Kidney disease in his father and mother; Rectal cancer in his sister.  Allergies  Allergen Reactions  . Penicillins     REACTION: RASH in throat       PHYSICAL EXAMINATION: Vital signs: BP 120/70   Pulse 87   Ht 5' 11"  (1.803 m)   Wt 174 lb (78.9 kg)   SpO2 94%   BMI 24.27 kg/m   Constitutional: generally well-appearing, no acute distress Psychiatric: alert and oriented x3, cooperative Eyes: extraocular movements intact, anicteric, conjunctiva pink Mouth: Mask Neck: supple no lymphadenopathy Cardiovascular: heart regular rate and rhythm, no murmur Lungs: clear to auscultation bilaterally Abdomen: soft, nontender, nondistended, no obvious ascites, no peritoneal signs, normal bowel sounds, no organomegaly.  Previous surgical incisions are well-healed Rectal: Omitted Extremities: no clubbing, cyanosis, or lower extremity edema bilaterally Skin: no lesions on visible extremities Neuro: No focal deficits.  Cranial nerves intact  ASSESSMENT:  1.  History of Crohn's ileocolitis status post remote ileocecectomy.  Asymptomatic 2.  Bile salt  induced diarrhea controlled with Colestid 3.  B12 deficiency adequately addressed placement therapy 4.  Sister with rectal cancer 5.  Surveillance colonoscopy February 2018   PLAN:  1.  Continue Colestid.  Prescription refilled 2.  Continue B12 replacement 3.  Surveillance colonoscopy around February 2023 4.  Routine office follow-up 1 year.  Contact the office in the interim for any questions or problems

## 2020-07-06 ENCOUNTER — Telehealth: Payer: Self-pay

## 2020-07-06 NOTE — Telephone Encounter (Signed)
Patient called needing RFs on his home lightbox machine. Code provided from patient was [id number] and 75 treatments given. [employer] patient RF code was [id number].

## 2020-07-13 ENCOUNTER — Ambulatory Visit (INDEPENDENT_AMBULATORY_CARE_PROVIDER_SITE_OTHER): Payer: Medicare PPO

## 2020-07-13 ENCOUNTER — Ambulatory Visit: Payer: Medicare PPO

## 2020-07-13 DIAGNOSIS — E538 Deficiency of other specified B group vitamins: Secondary | ICD-10-CM | POA: Diagnosis not present

## 2020-07-13 MED ORDER — CYANOCOBALAMIN 1000 MCG/ML IJ SOLN
1000.0000 ug | Freq: Once | INTRAMUSCULAR | Status: AC
  Administered 2020-07-13: 1000 ug via INTRAMUSCULAR

## 2020-07-13 NOTE — Progress Notes (Signed)
Per orders of Dr. Danise Mina, injection of B12 in Left Deltoid given by Lurlean Nanny. Patient tolerated injection well.

## 2020-08-08 DIAGNOSIS — M216X1 Other acquired deformities of right foot: Secondary | ICD-10-CM | POA: Diagnosis not present

## 2020-08-08 DIAGNOSIS — R201 Hypoesthesia of skin: Secondary | ICD-10-CM | POA: Diagnosis not present

## 2020-08-17 ENCOUNTER — Ambulatory Visit (INDEPENDENT_AMBULATORY_CARE_PROVIDER_SITE_OTHER): Payer: Medicare PPO

## 2020-08-17 DIAGNOSIS — E538 Deficiency of other specified B group vitamins: Secondary | ICD-10-CM | POA: Diagnosis not present

## 2020-08-17 MED ORDER — CYANOCOBALAMIN 1000 MCG/ML IJ SOLN
1000.0000 ug | Freq: Once | INTRAMUSCULAR | Status: AC
  Administered 2020-08-17: 1000 ug via INTRAMUSCULAR

## 2020-08-17 NOTE — Progress Notes (Signed)
Per orders of Dr. Danise Mina, injection of B12 in Right Deltoid given by Lurlean Nanny.  Patient tolerated injection well.

## 2020-08-19 DIAGNOSIS — Z23 Encounter for immunization: Secondary | ICD-10-CM | POA: Diagnosis not present

## 2020-08-19 DIAGNOSIS — S61011A Laceration without foreign body of right thumb without damage to nail, initial encounter: Secondary | ICD-10-CM | POA: Diagnosis not present

## 2020-10-06 ENCOUNTER — Ambulatory Visit (INDEPENDENT_AMBULATORY_CARE_PROVIDER_SITE_OTHER): Payer: Medicare PPO

## 2020-10-06 ENCOUNTER — Other Ambulatory Visit: Payer: Self-pay

## 2020-10-06 DIAGNOSIS — E538 Deficiency of other specified B group vitamins: Secondary | ICD-10-CM | POA: Diagnosis not present

## 2020-10-06 MED ORDER — CYANOCOBALAMIN 1000 MCG/ML IJ SOLN
1000.0000 ug | Freq: Once | INTRAMUSCULAR | Status: AC
  Administered 2020-10-06: 1000 ug via INTRAMUSCULAR

## 2020-10-06 NOTE — Progress Notes (Signed)
Per orders of Dr. Danise Mina, injection of 1000 mcg/ml B12 given by Beatriz Stallion, CMA in Left Deltoid. Patient tolerated injection well.  Sending to Dr Damita Dunnings to sign off in Dr Gutierrez's absence.

## 2020-11-16 ENCOUNTER — Ambulatory Visit (INDEPENDENT_AMBULATORY_CARE_PROVIDER_SITE_OTHER): Payer: Medicare PPO

## 2020-11-16 ENCOUNTER — Other Ambulatory Visit: Payer: Self-pay

## 2020-11-16 DIAGNOSIS — E538 Deficiency of other specified B group vitamins: Secondary | ICD-10-CM | POA: Diagnosis not present

## 2020-11-16 MED ORDER — CYANOCOBALAMIN 1000 MCG/ML IJ SOLN
1000.0000 ug | Freq: Once | INTRAMUSCULAR | Status: AC
  Administered 2020-11-16: 1000 ug via INTRAMUSCULAR

## 2020-11-16 NOTE — Progress Notes (Signed)
Per orders of Dr. Danise Mina, injection of vit H99 given by Brenton Grills. Patient tolerated injection well.

## 2020-11-22 ENCOUNTER — Ambulatory Visit
Admission: RE | Admit: 2020-11-22 | Discharge: 2020-11-22 | Disposition: A | Discharge status: Home / Self Care | Payer: Medicare PPO | Source: Ambulatory Visit | Attending: Emergency Medicine | Admitting: Emergency Medicine

## 2020-11-22 ENCOUNTER — Other Ambulatory Visit: Payer: Self-pay

## 2020-11-22 VITALS — BP 143/82 | HR 73 | Temp 97.9°F | Resp 18

## 2020-11-22 DIAGNOSIS — H6691 Otitis media, unspecified, right ear: Secondary | ICD-10-CM | POA: Diagnosis not present

## 2020-11-22 MED ORDER — AZITHROMYCIN 250 MG PO TABS: 250.0000 mg | ORAL_TABLET | Freq: Every day | ORAL | 0 refills | Status: DC

## 2020-11-22 NOTE — Discharge Instructions (Signed)
Take the Zithromax as directed.  Take Tylenol or ibuprofen as needed for discomfort.  Follow up with your primary care provider if your symptoms are not improving.

## 2020-11-22 NOTE — ED Provider Notes (Signed)
[name]    CSN: [id number] Arrival date & time: 11/22/20  0856      History   Chief Complaint Chief Complaint  Patient presents with  . Appointment  . Ear Fullness    HPI Paul Atkinson is a 70 y.o. male.   Patient presents with right ear pain and fullness x2 weeks.  He states he has had ear problems since getting hearing aids last year.  He also reports remote history of eustachian tube dysfunction.  He denies fever, chills, ear drainage, pain with movement, sore throat, cough, shortness of breath, or other symptoms.  Treatment attempted at home with Flonase.  His medical history includes hypertension, Crohn's disease, small bowel obstruction, duodenal ulcer, adjustment disorder, anxiety, depression, insomnia.  The history is provided by the patient and medical records.    Past Medical History:  Diagnosis Date  . Anemia    iron def  . Basal cell carcinoma    back, chest   . Bile salt-induced diarrhea   . Crohn disease ([place])    status post ileocecectomy 1989  . Disorder of inner ear   . Hearing loss   . Hx of duodenal ulcer   . Hx SBO   . Hypertension   . Malabsorption   . Psoriasis   . Ulcer   . Vitamin B 12 deficiency    secondary to ileal resection    Patient Active Problem List   Diagnosis Date Noted  . Medicare annual wellness visit, subsequent 05/03/2020  . Foot pain 05/03/2020  . Shoulder pain, bilateral 05/03/2020  . Cholelithiasis 09/10/2017  . Vitamin B12 deficiency 03/18/2017  . GAD (generalized anxiety disorder) 11/07/2016  . Adjustment disorder with mixed anxiety and depressed mood 11/07/2016  . Insomnia 11/07/2016  . Advanced care planning/counseling discussion 10/04/2015  . ETD (eustachian tube dysfunction) 06/08/2015  . Healthcare maintenance 02/17/2013  . Psoriasis 02/17/2013  . Dyslipidemia 10/04/2009  . Bile salt-induced diarrhea 12/17/2007  . Crohn's ileocolitis s/p remote ileostomy 11/10/2007  . HYPERTENSION, BENIGN  ESSENTIAL 05/19/2007  . Anemia 05/25/2000    Past Surgical History:  Procedure Laterality Date  . COLONOSCOPY  07/2016   patent anastomosis, no polyps, rpt 5 yrs Henrene Pastor)  . ILEOCECETOMY  1989   peritonitis with SBO from crohn's  . MOLE REMOVAL     back  . MOUTH SURGERY    . SEPTOPLASTY  05/2004   due to deviated septum (Dr. Myna Hidalgo)       Home Medications    Prior to Admission medications   Medication Sig Start Date End Date Taking? Authorizing Provider  azithromycin (ZITHROMAX) 250 MG tablet Take 1 tablet (250 mg total) by mouth daily. Take first 2 tablets together, then 1 every day until finished. 11/22/20  Yes Sharion Balloon, NP  amLODipine (NORVASC) 5 MG tablet Take 1 tablet (5 mg total) by mouth daily. 05/03/20   Ria Bush, MD  colestipol (COLESTID) 1 g tablet Take 4 tablets (4 g total) by mouth daily. 05/30/20   Irene Shipper, MD  cyanocobalamin (,VITAMIN B-12,) 1000 MCG/ML injection Inject 1,000 mcg into the muscle every 30 (thirty) days. Chronic therapy    [provider]  docusate sodium (COLACE) 100 MG capsule Take 1 capsule (100 mg total) by mouth daily. Patient taking differently: Take 100 mg by mouth daily as needed.  03/19/18   Ria Bush, MD  fluticasone [facility]) 50 MCG/ACT nasal spray Place 1 spray into both nostrils daily.     [provider]    Family History Family History  Problem Relation Age of Onset  . Heart disease Mother        CHF, arrhythmias  . Kidney disease Mother        kidney failure  . Hypertension Mother   . Kidney disease Father        renal failure  . CAD Father 89       MI stents  . Diabetes Father 80       on insulin  . Hyperlipidemia Father   . Rectal cancer Sister   . Cancer Neg Hx   . Colon cancer Neg Hx   . Pancreatic cancer Neg Hx   . Esophageal cancer Neg Hx     Social History Social History   Tobacco Use  . Smoking status: Never Smoker  . Smokeless tobacco: Never Used  Vaping Use  .  Vaping Use: Never used  Substance Use Topics  . Alcohol use: No    Alcohol/week: 0.0 standard drinks    Comment: very rarely  . Drug use: No     Allergies   Penicillins   Review of Systems Review of Systems  Constitutional: Negative for chills and fever.  HENT: Positive for ear pain. Negative for ear discharge and sore throat.   Respiratory: Negative for cough and shortness of breath.   Cardiovascular: Negative for chest pain and palpitations.  Gastrointestinal: Negative for abdominal pain and vomiting.  Skin: Negative for color change and rash.  All other systems reviewed and are negative.    Physical Exam Triage Vital Signs ED Triage Vitals  Enc Vitals Group     BP      Pulse      Resp      Temp      Temp src      SpO2      Weight      Height      Head Circumference      Peak Flow      Pain Score      Pain Loc      Pain Edu?      Excl. in [place]?    No data found.  Updated Vital Signs BP (!) 143/82   Pulse 73   Temp 97.9 F (36.6 C)   Resp 18   SpO2 97%   Visual Acuity Right Eye Distance:   Left Eye Distance:   Bilateral Distance:    Right Eye Near:   Left Eye Near:    Bilateral Near:     Physical Exam Vitals and nursing note reviewed.  Constitutional:      General: He is not in acute distress.    Appearance: He is well-developed.  HENT:     Head: Normocephalic and atraumatic.     Right Ear: Ear canal normal. Tympanic membrane is erythematous and bulging.     Left Ear: Ear canal normal. Tympanic membrane is not erythematous.     Nose: Nose normal.     Mouth/Throat:     Mouth: Mucous membranes are moist.     Pharynx: Oropharynx is clear.  Eyes:     Conjunctiva/sclera: Conjunctivae normal.  Cardiovascular:     Rate and Rhythm: Normal rate and regular rhythm.     Heart sounds: Normal heart sounds.  Pulmonary:     Effort: Pulmonary effort is normal. No respiratory distress.     Breath sounds: Normal breath sounds.  Abdominal:      Palpations: Abdomen is  soft.     Tenderness: There is no abdominal tenderness.  Musculoskeletal:     Cervical back: Neck supple.  Skin:    General: Skin is warm and dry.  Neurological:     General: No focal deficit present.     Mental Status: He is alert and oriented to person, place, and time.  Psychiatric:        Mood and Affect: Mood normal.        Behavior: Behavior normal.      UC Treatments / Results  Labs (all labs ordered are listed, but only abnormal results are displayed) Labs Reviewed - No data to display  EKG   Radiology No results found.  Procedures Procedures (including critical care time)  Medications Ordered in UC Medications - No data to display  Initial Impression / Assessment and Plan / UC Course  I have reviewed the triage vital signs and the nursing notes.  Pertinent labs & imaging results that were available during my care of the patient were reviewed by me and considered in my medical decision making (see chart for details).   Right otitis media.  Patient is allergic to penicillin.  Treating with Zithromax.  Tylenol or ibuprofen as needed for discomfort.  Instructed patient to follow-up with his PCP if his symptoms are not improving.  He agrees to plan of care.   Final Clinical Impressions(s) / UC Diagnoses   Final diagnoses:  Right otitis media, unspecified otitis media type     Discharge Instructions     Take the Zithromax as directed.  Take Tylenol or ibuprofen as needed for discomfort.  Follow up with your primary care provider if your symptoms are not improving.        ED Prescriptions    Medication Sig Dispense Auth. Provider   azithromycin (ZITHROMAX) 250 MG tablet Take 1 tablet (250 mg total) by mouth daily. Take first 2 tablets together, then 1 every day until finished. 6 tablet Sharion Balloon, NP     PDMP not reviewed this encounter.   Sharion Balloon, NP 11/22/20 [phone]

## 2020-11-22 NOTE — ED Triage Notes (Signed)
Pt presents with complaints of right ear fullness. Reports he has newly gotten hearing aids. He had this happen before with relief with flonase. Pt reports these symptoms have been present x 2 weeks with no relief form flonase.

## 2020-12-26 ENCOUNTER — Telehealth: Payer: Self-pay | Admitting: Physician Assistant

## 2020-12-26 DIAGNOSIS — K5000 Crohn's disease of small intestine without complications: Secondary | ICD-10-CM

## 2020-12-26 DIAGNOSIS — R112 Nausea with vomiting, unspecified: Secondary | ICD-10-CM

## 2020-12-26 DIAGNOSIS — R1084 Generalized abdominal pain: Secondary | ICD-10-CM

## 2020-12-26 NOTE — Telephone Encounter (Signed)
Telephone Call  12/26/2020 9:52 AM  Patient called our on-call service on 4 th of July to discuss that over the past 2 to 3 days he has been experiencing a "bowel blockage".  Tells me this is happened twice before in his life with his history of Crohn's.  He was not passing gas or stool and was having some nausea and vomiting.  When this started the patient stopped eating anything at all and over the past 24 hours symptoms seem to have gotten better.  He is still not passed any gas or stool but has no further nausea or vomiting and his abdominal distention is decreased.  He tells me he is not in any pain currently and typically when he has had these episodes they resolve on their own not requiring hospitalization.  Tells me he would just feel better if a "a GI doctor" listen to his abdomen to make sure nothing is going to get worse.  Explained that the only way to do that on a holiday is for him to go to the urgent care or ER.  He declines.  He would like to see if he can get in for an appointment early this week.    Explained that I will leave a message for our nurses to try and get him in.  If he cannot be seen then possibly Dr. Henrene Pastor would feel okay ordering an abdominal x-ray for him.  That if symptoms worsen he needs to go to the ER.  Ellouise Newer, PA-C  [place]- can you call the patient in the morning 12/27/20 and see what we can do for him. Thanks-JLL

## 2020-12-26 NOTE — Telephone Encounter (Signed)
I'm in hospital this week.  Get plain films of the abdomen "N,V, abdominal pain, remote Chron's" AND have him see any provider or App in the office this week.  Thanks  JP

## 2020-12-27 ENCOUNTER — Other Ambulatory Visit: Payer: Self-pay

## 2020-12-27 ENCOUNTER — Ambulatory Visit (INDEPENDENT_AMBULATORY_CARE_PROVIDER_SITE_OTHER)
Admission: RE | Admit: 2020-12-27 | Discharge: 2020-12-27 | Disposition: A | Discharge status: Home / Self Care | Payer: Medicare PPO | Source: Ambulatory Visit | Attending: Internal Medicine | Admitting: Internal Medicine

## 2020-12-27 DIAGNOSIS — R112 Nausea with vomiting, unspecified: Secondary | ICD-10-CM | POA: Diagnosis not present

## 2020-12-27 DIAGNOSIS — R1084 Generalized abdominal pain: Secondary | ICD-10-CM

## 2020-12-27 DIAGNOSIS — R109 Unspecified abdominal pain: Secondary | ICD-10-CM | POA: Diagnosis not present

## 2020-12-27 DIAGNOSIS — K5000 Crohn's disease of small intestine without complications: Secondary | ICD-10-CM

## 2020-12-27 NOTE — Addendum Note (Signed)
Addended by: Yevette Edwards on: 12/27/2020 10:56 AM   Modules accepted: Orders

## 2020-12-27 NOTE — Telephone Encounter (Signed)
Spoke with patient in regards to recommendations. He is aware that we have ordered an x-ray and he can stop by the x-ray department in the basement of our office anytime before 5 pm today. He has been scheduled for a follow up with Carl Best, NP on Monday, 01/02/21 at 1:30 PM. Patient verbalized understanding and had no concerns at the end of the call.   X-ray order in epic.

## 2020-12-27 NOTE — Telephone Encounter (Signed)
Dr. Henrene Pastor, no provider appts available this week. Next available APP appt is Monday, 01/02/21. Please advise, thanks.

## 2020-12-28 ENCOUNTER — Ambulatory Visit (INDEPENDENT_AMBULATORY_CARE_PROVIDER_SITE_OTHER): Payer: Medicare PPO

## 2020-12-28 ENCOUNTER — Other Ambulatory Visit: Payer: Self-pay

## 2020-12-28 DIAGNOSIS — E538 Deficiency of other specified B group vitamins: Secondary | ICD-10-CM | POA: Diagnosis not present

## 2020-12-28 DIAGNOSIS — R112 Nausea with vomiting, unspecified: Secondary | ICD-10-CM

## 2020-12-28 DIAGNOSIS — K56600 Partial intestinal obstruction, unspecified as to cause: Secondary | ICD-10-CM

## 2020-12-28 DIAGNOSIS — K5000 Crohn's disease of small intestine without complications: Secondary | ICD-10-CM

## 2020-12-28 DIAGNOSIS — R1084 Generalized abdominal pain: Secondary | ICD-10-CM

## 2020-12-28 MED ORDER — CYANOCOBALAMIN 1000 MCG/ML IJ SOLN
1000.0000 ug | Freq: Once | INTRAMUSCULAR | Status: AC
  Administered 2020-12-28: 1000 ug via INTRAMUSCULAR

## 2020-12-28 NOTE — Progress Notes (Signed)
Per orders of Dr. Ria Bush, injection of B-12 given by Francella Solian in right deltoid. Patient tolerated injection well. Patient will make appointment for 1 month.

## 2020-12-30 ENCOUNTER — Other Ambulatory Visit: Payer: Medicare PPO

## 2021-01-02 ENCOUNTER — Other Ambulatory Visit (INDEPENDENT_AMBULATORY_CARE_PROVIDER_SITE_OTHER): Payer: Medicare PPO

## 2021-01-02 ENCOUNTER — Ambulatory Visit: Payer: Medicare PPO | Admitting: Nurse Practitioner

## 2021-01-02 ENCOUNTER — Encounter: Payer: Self-pay | Admitting: Nurse Practitioner

## 2021-01-02 VITALS — BP 114/70 | HR 84 | Ht 71.0 in | Wt 170.1 lb

## 2021-01-02 DIAGNOSIS — Z8719 Personal history of other diseases of the digestive system: Secondary | ICD-10-CM | POA: Diagnosis not present

## 2021-01-02 DIAGNOSIS — R112 Nausea with vomiting, unspecified: Secondary | ICD-10-CM

## 2021-01-02 LAB — CBC WITH DIFFERENTIAL/PLATELET
Basophils Absolute: 0 10*3/uL (ref 0.0–0.1)
Basophils Relative: 0.8 % (ref 0.0–3.0)
Eosinophils Absolute: 0.2 10*3/uL (ref 0.0–0.7)
Eosinophils Relative: 3.4 % (ref 0.0–5.0)
HCT: 42.1 % (ref 39.0–52.0)
Hemoglobin: 14.7 g/dL (ref 13.0–17.0)
Lymphocytes Relative: 17.7 % (ref 12.0–46.0)
Lymphs Abs: 0.8 10*3/uL (ref 0.7–4.0)
MCHC: 34.9 g/dL (ref 30.0–36.0)
MCV: 91.9 fl (ref 78.0–100.0)
Monocytes Absolute: 0.5 10*3/uL (ref 0.1–1.0)
Monocytes Relative: 10.9 % (ref 3.0–12.0)
Neutro Abs: 3.1 10*3/uL (ref 1.4–7.7)
Neutrophils Relative %: 67.2 % (ref 43.0–77.0)
Platelets: 250 10*3/uL (ref 150.0–400.0)
RBC: 4.58 Mil/uL (ref 4.22–5.81)
RDW: 13.2 % (ref 11.5–15.5)
WBC: 4.7 10*3/uL (ref 4.0–10.5)

## 2021-01-02 LAB — COMPREHENSIVE METABOLIC PANEL
ALT: 13 U/L (ref 0–53)
AST: 15 U/L (ref 0–37)
Albumin: 4.4 g/dL (ref 3.5–5.2)
Alkaline Phosphatase: 59 U/L (ref 39–117)
BUN: 10 mg/dL (ref 6–23)
CO2: 26 mEq/L (ref 19–32)
Calcium: 9.2 mg/dL (ref 8.4–10.5)
Chloride: 104 mEq/L (ref 96–112)
Creatinine, Ser: 0.98 mg/dL (ref 0.40–1.50)
GFR: 77.84 mL/min (ref >60.00)
Glucose, Bld: 102 mg/dL — ABNORMAL HIGH (ref 70–99)
Potassium: 4.2 mEq/L (ref 3.5–5.1)
Sodium: 139 mEq/L (ref 135–145)
Total Bilirubin: 0.9 mg/dL (ref 0.2–1.2)
Total Protein: 7.1 g/dL (ref 6.0–8.3)

## 2021-01-02 NOTE — Progress Notes (Signed)
01/02/2021 [name] [id number] [birth year]   Chief Complaint: N/V, abdominal pain   History of Present Illness: Paul Atkinson is a 70 year old male with a past medical history of hypertension, bile salt induced diarrhea, iron deficiency anemia, vitamin B12 deficiency, remote Crohn's disease s/p ileocecectomy 1989.   He celebrated his 70st birthday on 12/[birth year]/2020 with his family, he reported eating a moderate amount of barbecue.  The next day he saw his daughter and granddaughter and he ate pizza and birthday cake.  He then drove to [facility] to attend a sports car event and started to feel full which persisted on his drive home.  The next day on 7/2 he did not feel well and his appetite was decreased.  He developed nonbloody diarrhea followed by nausea, cold sweats and vomited 6-8 times.  No hematemesis.  His abdomen was distended, no gas or stool per the rectum for the next few days.  He went on a clear liquid diet for the next few days. He called our answering service on 12/26/2020 with concerns he may have a bowel obstruction. An abdominal x-ray was done 12/27/2020 which showed a dilated small bowel with multiple fluid levels with scattered colon gas suggestive of a developing bowel obstruction.  An abdominal/pelvic CT scan was ordered by Dr. Henrene Pastor.  The CT scan was scheduled on 12/30/2020 but was canceled as his insurance carrier did not provide authorization in time to proceed with the CT as scheduled.   He presents her office today for further follow-up and to reschedule an abdominal/pelvic CT scan.  He is feeling better.  No further nausea or vomiting.  No BM x 4 days so he stopped taking Colestipol 4 days ago as he was concerned this may be contributing to blockage.  He passed a fairly soft bowel movement yesterday evening and a small amount of softly formed stool this morning with gas per the rectum.  He is eating a low residue diet and is pushing fluids.  Reported losing 9 pounds over the  past week.  He stated the last time he had a similar episode of a bowel obstruction as approximately 10 years ago.  He questions if he has adhesions from his prior ileocolectomy surgery may result in a bowel obstruction.  His most recent colonoscopy was 08/10/2016 which showed a patent end to side ileocolonic anastomosis site with ulceration, the TI was normal.  A repeat colonoscopy 07/2021 was recommended.  Sister with history of rectal cancer.  No NSAID use.  Abdominal x-ray 12/27/2020: Dilated small bowel with multiple fluid levels though with scattered colon gas, findings could be secondary to partial or developing bowel obstruction. Consider CT for further evaluation.  Colonoscopy 08/10/2016:  - The examined portion of the ileum was normal. No active Crohn's disease. - Patent end-to-side ileo-colonic anastomosis, characterized by ulceration. - The examination was otherwise normal on direct and retroflexion views. No active Crohn's disease. - No specimens collected. - 5 year colonoscopy recall   Colonoscopy 02/08/2011: No active Crohn's  Ulcer at the anastomosis Normal colonic mucosa 5-year recall  Current Medications, Allergies, Past Medical History, Past Surgical History, Family History and Social History were reviewed in [employer] record.   Review of Systems:   Constitutional: Negative for fever, sweats, chills or weight loss.  Respiratory: Negative for shortness of breath.   Cardiovascular: Negative for chest pain, palpitations and leg swelling.  Gastrointestinal: See HPI.  Musculoskeletal: Negative for back pain or muscle  aches.  Neurological: Negative for dizziness, headaches or paresthesias.    Physical Exam: BP 114/70 (BP Location: Left Arm, Patient Position: Sitting, Cuff Size: Normal)   Pulse 84   Ht 5' 11"  (1.803 m)   Wt 170 lb 2 oz (77.2 kg)   SpO2 97%   BMI 23.73 kg/m   General: Well 70 year old male in no acute distress. Head:  Normocephalic and atraumatic. Eyes: No scleral icterus. Conjunctiva pink . Ears: Normal auditory acuity. Mouth: Dentition intact. No ulcers or lesions.  Lungs: Clear throughout to auscultation. Heart: Regular rate and rhythm, no murmur. Abdomen: Soft, nondistended.  Mild generalized tenderness without rebound or guarding.  No masses or hepatomegaly.  Hypoactive bowel sounds x 4 quadrants.  Rectal: Deferred. Musculoskeletal: Symmetrical with no gross deformities. Extremities: No edema. Neurological: Alert oriented x 4. No focal deficits.  Psychological: Alert and cooperative. Normal mood and affect  Assessment and Recommendations:  54.  70 year old male with a remote history of Crohn's disease s/p  ileocecectomy at [facility] with N/V, generalized abdominal pain and diarrhea.  Abdominal x-ray 12/27/2020 showed a dilated small bowel with multiple fluid levels concerning for a developing obstruction (likely has adhesions from past abdominal surgeries.   An abdominal/pelvic CT was ordered but insurance authorization was not received in time for the scheduled appointment on 12/30/2020.  No further nausea or vomiting.  He passed a semiformed stool yesterday evening and earlier this morning. -Reschedule abdominal/pelvic CT with contrast as previously ordered by Dr. Henrene Pastor -CBC, CMP -Push fluids, low residue diet, avoid eating heavy larger meals -Patient to present to the ED if he develops severe abdominal pain -Further recommendations to be determined after CTAP and lab results reviewed  2.  Colon cancer screening.  No polyps per colonoscopy 07/2016.  Positive family history of rectal cancer. -Next colonoscopy due 07/2021  Today's encounter was 25 minutes which included precharting, chart review, history/exam, result review, face-to-face time used for dietary counseling, formulating a treatment plan and documentation.

## 2021-01-02 NOTE — Patient Instructions (Addendum)
If you are age 71 or older, your body mass index should be between 23-30. Your Body mass index is 23.73 kg/m. If this is out of the aforementioned range listed, please consider follow up with your Primary Care Provider.  Please call [facility] to schedule your CT scan. Located in: [facility] Address: [address], [place], [place] [address] Phone: [phone]  LABS:  Lab work has been ordered for you today. Our lab is located in the basement. Press "B" on the elevator. The lab is located at the first door on the left as you exit the elevator.  HEALTHCARE LAWS AND MY CHART RESULTS: Due to recent changes in healthcare laws, you may see the results of your imaging and laboratory studies on MyChart before your provider has had a chance to review them.   We understand that in some cases there may be results that are confusing or concerning to you. Not all laboratory results come back in the same time frame and the provider may be waiting for multiple results in order to interpret others.  Please give [place] 48 hours in order for your provider to thoroughly review all the results before contacting the office for clarification of your results.   RECOMMENDATIONS: Call and schedule the CT scan. Increase fluid intake. Go to the emergency room if severe abdominal pain occurs.  It was great seeing you today! Thank you for entrusting me with your care and choosing [facility].  Noralyn Pick, CRNP

## 2021-01-03 ENCOUNTER — Ambulatory Visit
Admission: RE | Admit: 2021-01-03 | Discharge: 2021-01-03 | Disposition: A | Discharge status: Home / Self Care | Payer: Medicare PPO | Source: Ambulatory Visit | Attending: Internal Medicine | Admitting: Internal Medicine

## 2021-01-03 ENCOUNTER — Other Ambulatory Visit: Payer: Self-pay

## 2021-01-03 DIAGNOSIS — K8020 Calculus of gallbladder without cholecystitis without obstruction: Secondary | ICD-10-CM | POA: Diagnosis not present

## 2021-01-03 DIAGNOSIS — N400 Enlarged prostate without lower urinary tract symptoms: Secondary | ICD-10-CM | POA: Diagnosis not present

## 2021-01-03 DIAGNOSIS — K5000 Crohn's disease of small intestine without complications: Secondary | ICD-10-CM

## 2021-01-03 DIAGNOSIS — R1084 Generalized abdominal pain: Secondary | ICD-10-CM

## 2021-01-03 DIAGNOSIS — R112 Nausea with vomiting, unspecified: Secondary | ICD-10-CM

## 2021-01-03 DIAGNOSIS — K50918 Crohn's disease, unspecified, with other complication: Secondary | ICD-10-CM | POA: Diagnosis not present

## 2021-01-03 MED ORDER — IOPAMIDOL (ISOVUE-300) INJECTION 61%
100.0000 mL | Freq: Once | INTRAVENOUS | Status: AC | PRN
  Administered 2021-01-03: 100 mL via INTRAVENOUS

## 2021-01-03 NOTE — Progress Notes (Signed)
Assessment and plans noted. Thank you for seeing Paul Atkinson.

## 2021-01-23 ENCOUNTER — Ambulatory Visit (AMBULATORY_SURGERY_CENTER): Payer: Self-pay | Admitting: *Deleted

## 2021-01-23 ENCOUNTER — Other Ambulatory Visit: Payer: Self-pay

## 2021-01-23 VITALS — Ht 71.0 in | Wt 173.0 lb

## 2021-01-23 DIAGNOSIS — Z8719 Personal history of other diseases of the digestive system: Secondary | ICD-10-CM

## 2021-01-23 DIAGNOSIS — Z800 Family history of malignant neoplasm of digestive organs: Secondary | ICD-10-CM

## 2021-01-23 MED ORDER — NA SULFATE-K SULFATE-MG SULF 17.5-3.13-1.6 GM/177ML PO SOLN: 1.0000 | Freq: Once | ORAL | 0 refills | Status: AC

## 2021-01-23 NOTE — Progress Notes (Signed)

## 2021-01-27 ENCOUNTER — Encounter: Payer: Self-pay | Admitting: Internal Medicine

## 2021-01-27 ENCOUNTER — Ambulatory Visit (AMBULATORY_SURGERY_CENTER): Payer: Medicare PPO | Admitting: Internal Medicine

## 2021-01-27 ENCOUNTER — Other Ambulatory Visit: Payer: Self-pay

## 2021-01-27 VITALS — BP 117/62 | HR 63 | Temp 97.0°F | Resp 12 | Ht 71.0 in | Wt 173.0 lb

## 2021-01-27 DIAGNOSIS — Z8719 Personal history of other diseases of the digestive system: Secondary | ICD-10-CM | POA: Diagnosis not present

## 2021-01-27 DIAGNOSIS — K50018 Crohn's disease of small intestine with other complication: Secondary | ICD-10-CM

## 2021-01-27 DIAGNOSIS — K9089 Other intestinal malabsorption: Secondary | ICD-10-CM | POA: Diagnosis not present

## 2021-01-27 DIAGNOSIS — K5090 Crohn's disease, unspecified, without complications: Secondary | ICD-10-CM | POA: Diagnosis not present

## 2021-01-27 DIAGNOSIS — K56600 Partial intestinal obstruction, unspecified as to cause: Secondary | ICD-10-CM | POA: Diagnosis not present

## 2021-01-27 DIAGNOSIS — K9189 Other postprocedural complications and disorders of digestive system: Secondary | ICD-10-CM | POA: Diagnosis not present

## 2021-01-27 HISTORY — PX: COLONOSCOPY: SHX174

## 2021-01-27 MED ORDER — SODIUM CHLORIDE 0.9 % IV SOLN: 500.0000 mL | Freq: Once | INTRAVENOUS | Status: DC

## 2021-01-27 NOTE — Patient Instructions (Signed)
Resume previous diet and medications. Awaiting pathology results. Routine office follow up in 1 year.  YOU HAD AN ENDOSCOPIC PROCEDURE TODAY AT [facility]:   Refer to the procedure report that was given to you for any specific questions about what was found during the examination.  If the procedure report does not answer your questions, please call your gastroenterologist to clarify.  If you requested that your care partner not be given the details of your procedure findings, then the procedure report has been included in a sealed envelope for you to review at your convenience later.  YOU SHOULD EXPECT: Some feelings of bloating in the abdomen. Passage of more gas than usual.  Walking can help get rid of the air that was put into your GI tract during the procedure and reduce the bloating. If you had a lower endoscopy (such as a colonoscopy or flexible sigmoidoscopy) you may notice spotting of blood in your stool or on the toilet paper. If you underwent a bowel prep for your procedure, you may not have a normal bowel movement for a few days.  Please Note:  You might notice some irritation and congestion in your nose or some drainage.  This is from the oxygen used during your procedure.  There is no need for concern and it should clear up in a day or so.  SYMPTOMS TO REPORT IMMEDIATELY:  Following lower endoscopy (colonoscopy or flexible sigmoidoscopy):  Excessive amounts of blood in the stool  Significant tenderness or worsening of abdominal pains  Swelling of the abdomen that is new, acute  Fever of 100F or higher   For urgent or emergent issues, a gastroenterologist can be reached at any hour by calling [phone]. Do not use MyChart messaging for urgent concerns.    DIET:  We do recommend a small meal at first, but then you may proceed to your regular diet.  Drink plenty of fluids but you should avoid alcoholic beverages for 24 hours.  ACTIVITY:  You should plan to  take it easy for the rest of today and you should NOT DRIVE or use heavy machinery until tomorrow (because of the sedation medicines used during the test).    FOLLOW UP: Our staff will call the number listed on your records 48-72 hours following your procedure to check on you and address any questions or concerns that you may have regarding the information given to you following your procedure. If we do not reach you, we will leave a message.  We will attempt to reach you two times.  During this call, we will ask if you have developed any symptoms of COVID 19. If you develop any symptoms (ie: fever, flu-like symptoms, shortness of breath, cough etc.) before then, please call [phone].  If you test positive for Covid 19 in the 2 weeks post procedure, please call and report this information to [place].    If any biopsies were taken you will be contacted by phone or by letter within the next 1-3 weeks.  Please call us at [phone] if you have not heard about the biopsies in 3 weeks.    SIGNATURES/CONFIDENTIALITY: You and/or your care partner have signed paperwork which will be entered into your electronic medical record.  These signatures attest to the fact that that the information above on your After Visit Summary has been reviewed and is understood.  Full responsibility of the confidentiality of this discharge information lies with you and/or your care-partner.

## 2021-01-27 NOTE — Progress Notes (Signed)
Pt's states no medical or surgical changes since previsit or office visit.   VS taken by DT

## 2021-01-27 NOTE — Op Note (Signed)
[facility] Patient Name: Paul Atkinson Procedure Date: 01/27/2021 11:37 AM MRN: [id number] Endoscopist: Docia Chuck. Henrene Pastor , MD Age: 70 Referring MD:  Date of Birth: [birth year] Gender: Male Account #: [id number] Procedure:                Colonoscopy with biopsies Indications:              High risk colon cancer surveillance: Crohn's                            ileocolitis remotely status post ileocecectomy.                            Recently presented with abdominal pain and                            obstructive symptoms. CT scan questioned active                            disease in the new ileum. Last colonoscopy February                            2018 Medicines:                Monitored Anesthesia Care Procedure:                Pre-Anesthesia Assessment:                           - Prior to the procedure, a History and Physical                            was performed, and patient medications and                            allergies were reviewed. The patient's tolerance of                            previous anesthesia was also reviewed. The risks                            and benefits of the procedure and the sedation                            options and risks were discussed with the patient.                            All questions were answered, and informed consent                            was obtained. Prior Anticoagulants: The patient has                            taken no previous anticoagulant or antiplatelet  agents. ASA Grade Assessment: II - A patient with                            mild systemic disease. After reviewing the risks                            and benefits, the patient was deemed in                            satisfactory condition to undergo the procedure.                           After obtaining informed consent, the colonoscope                            was passed under direct vision. Throughout the                             procedure, the patient's blood pressure, pulse, and                            oxygen saturations were monitored continuously. The                            CF [id number] #[id number] was introduced through the anus                            and advanced to the the ileocolonic anastomosis.                            The terminal ileum and the rectum were                            photographed. The quality of the bowel preparation                            was excellent. The colonoscopy was performed                            without difficulty. The patient tolerated the                            procedure well. The bowel preparation used was                            SUPREP/tablet via split dose instruction. Scope In: 11:51:28 AM Scope Out: 12:17:59 PM Scope Withdrawal Time: 0 hours 9 minutes 25 seconds  Total Procedure Duration: 0 hours 26 minutes 31 seconds  Findings:                 The colonoscope was advanced to the ileocecal                            anastomosis. The anastomosis was patent but would  not easily permit the passage of the adult                            colonoscope. There was superficial ulceration at                            the anastomosis. However, views into the neoileum                            did not demonstrate ulceration or active                            inflammation. Biopsies of the ulcerated area were                            taken. The entire examined colon appeared normal on                            direct and retroflexion views. Complications:            No immediate complications. Estimated blood loss:                            None. Estimated Blood Loss:     Estimated blood loss: none. Impression:               1. Superficial ulceration at the ileocolonic                            anastomosis                           2. Noncritical narrowing of the anastomosis                           3.  Visualized needle ileum was unremarkable                           4. Normal residual colon                           5. I believe his recent issues were secondary to                            adhesions, not active Crohn's disease. Recommendation:           - Repeat colonoscopy in 5 years for surveillance.                           - Patient has a contact number available for                            emergencies. The signs and symptoms of potential                            delayed complications were discussed with the  patient. Return to normal activities tomorrow.                            Written discharge instructions were provided to the                            patient.                           - Resume previous diet.                           - Continue present medications.                           - Await pathology results.                           -Routine office follow-up 1 year Derika Eckles N. Henrene Pastor, MD 01/27/2021 12:25:59 PM This report has been signed electronically.

## 2021-01-27 NOTE — Progress Notes (Signed)
A/ox3, pleased with MAC, report to RN 

## 2021-01-31 ENCOUNTER — Telehealth: Payer: Self-pay

## 2021-01-31 NOTE — Telephone Encounter (Signed)
  Follow up Call-  Call back number 01/27/2021  Post procedure Call Back phone  # [phone]  Permission to leave phone message Yes  Some recent data might be hidden     Patient questions:  Do you have a fever, pain , or abdominal swelling? No. Pain Score  0 *  Have you tolerated food without any problems? Yes.    Have you been able to return to your normal activities? Yes.    Do you have any questions about your discharge instructions: Diet   No. Medications  No. Follow up visit  No.  Do you have questions or concerns about your Care? No.  Actions: * If pain score is 4 or above: No action needed, pain <4.  Have you developed a fever since your procedure? no  2.   Have you had an respiratory symptoms (SOB or cough) since your procedure? no  3.   Have you tested positive for COVID 19 since your procedure no  4.   Have you had any family members/close contacts diagnosed with the COVID 19 since your procedure?  no   If yes to any of these questions please route to Joylene John, RN and Joella Prince, RN

## 2021-02-01 ENCOUNTER — Ambulatory Visit (INDEPENDENT_AMBULATORY_CARE_PROVIDER_SITE_OTHER): Payer: Medicare PPO | Admitting: *Deleted

## 2021-02-01 ENCOUNTER — Other Ambulatory Visit: Payer: Self-pay

## 2021-02-01 DIAGNOSIS — E538 Deficiency of other specified B group vitamins: Secondary | ICD-10-CM | POA: Diagnosis not present

## 2021-02-01 MED ORDER — CYANOCOBALAMIN 1000 MCG/ML IJ SOLN
1000.0000 ug | Freq: Once | INTRAMUSCULAR | Status: AC
  Administered 2021-02-01: 1000 ug via INTRAMUSCULAR

## 2021-02-01 NOTE — Progress Notes (Signed)
Per orders of Dr. Danise Mina, injection of B12 given by Earleen Reaper M in Left Deltoid. Patient tolerated injection well.

## 2021-02-03 ENCOUNTER — Encounter: Payer: Self-pay | Admitting: Internal Medicine

## 2021-02-06 NOTE — Telephone Encounter (Signed)
Paul Atkinson please help this patient per his request.  Thanks

## 2021-02-22 ENCOUNTER — Encounter: Payer: Self-pay | Admitting: Family Medicine

## 2021-03-22 ENCOUNTER — Ambulatory Visit (INDEPENDENT_AMBULATORY_CARE_PROVIDER_SITE_OTHER): Payer: Medicare PPO

## 2021-03-22 ENCOUNTER — Other Ambulatory Visit: Payer: Self-pay

## 2021-03-22 DIAGNOSIS — E538 Deficiency of other specified B group vitamins: Secondary | ICD-10-CM | POA: Diagnosis not present

## 2021-03-22 MED ORDER — CYANOCOBALAMIN 1000 MCG/ML IJ SOLN
1000.0000 ug | Freq: Once | INTRAMUSCULAR | Status: AC
  Administered 2021-03-22: 1000 ug via INTRAMUSCULAR

## 2021-03-22 NOTE — Progress Notes (Signed)
Per orders of Allie Bossier, in the absence of Dr. Danise Mina, monthly injection of B12, given by Loreen Freud. Patient tolerated injection well.

## 2021-03-30 ENCOUNTER — Ambulatory Visit: Payer: Medicare PPO | Admitting: Dermatology

## 2021-03-30 ENCOUNTER — Other Ambulatory Visit: Payer: Self-pay

## 2021-03-30 DIAGNOSIS — L821 Other seborrheic keratosis: Secondary | ICD-10-CM

## 2021-03-30 DIAGNOSIS — Z1283 Encounter for screening for malignant neoplasm of skin: Secondary | ICD-10-CM

## 2021-03-30 DIAGNOSIS — D229 Melanocytic nevi, unspecified: Secondary | ICD-10-CM | POA: Diagnosis not present

## 2021-03-30 DIAGNOSIS — L814 Other melanin hyperpigmentation: Secondary | ICD-10-CM

## 2021-03-30 DIAGNOSIS — D1801 Hemangioma of skin and subcutaneous tissue: Secondary | ICD-10-CM

## 2021-03-30 DIAGNOSIS — L409 Psoriasis, unspecified: Secondary | ICD-10-CM

## 2021-03-30 DIAGNOSIS — L219 Seborrheic dermatitis, unspecified: Secondary | ICD-10-CM

## 2021-03-30 DIAGNOSIS — L578 Other skin changes due to chronic exposure to nonionizing radiation: Secondary | ICD-10-CM | POA: Diagnosis not present

## 2021-03-30 DIAGNOSIS — Z85828 Personal history of other malignant neoplasm of skin: Secondary | ICD-10-CM | POA: Diagnosis not present

## 2021-03-30 MED ORDER — CLOBETASOL PROPIONATE 0.05 % EX SOLN: CUTANEOUS | 2 refills | Status: DC

## 2021-03-30 MED ORDER — KETOCONAZOLE 2 % EX SHAM: MEDICATED_SHAMPOO | CUTANEOUS | 0 refills | Status: DC

## 2021-03-30 NOTE — Progress Notes (Signed)
Follow-Up Visit   Subjective  Paul Atkinson is a 70 y.o. male who presents for the following: Annual Exam (Mole check, Hx of BCCs chest, back) and Psoriasis (Hands, 1 yr f/u, home light box 1x/wk, vaseline). The patient presents for Total-Body Skin Exam (TBSE) for skin cancer screening and mole check.  + Crohn's disease   The following portions of the chart were reviewed this encounter and updated as appropriate:   Tobacco  Allergies  Meds  Problems  Med Hx  Surg Hx  Fam Hx      Review of Systems:  No other skin or systemic complaints except as noted in HPI or Assessment and Plan.  Objective  Well appearing patient in no apparent distress; mood and affect are within normal limits.  A full examination was performed including scalp, head, eyes, ears, nose, lips, neck, chest, axillae, abdomen, back, buttocks, bilateral upper extremities, bilateral lower extremities, hands, feet, fingers, toes, fingernails, and toenails. All findings within normal limits unless otherwise noted below.  hands Few thin scaly pink plaques  Scalp Pink patches with greasy scale.    Assessment & Plan  Psoriasis hands  Chronic condition with duration or expected duration over one year. Condition is bothersome to patient. Not currently at goal.   Psoriasis is a chronic non-curable, but treatable genetic/hereditary disease that may have other systemic features affecting other organ systems such as joints (Psoriatic Arthritis). It is associated with an increased risk of inflammatory bowel disease, heart disease, non-alcoholic fatty liver disease, and depression.    + joint pain in the mornings knees and feet, pt report his joint pain is from old age, which seems to be getting worse.  Recommend consult with Rheumatologist. Reviewed risk of psoriatic arthritis.  Start samples of Vtama cream apply to skin once a day. Pt to call if this works and will send in rx. Has failed numerous topicals including  various topical steroids including clobetasol, calcipotriene and calcineurin inhibitors.   Cont home phototherapy up to TID prn, pt already has at home.  Side effects from long time phototherapy treatment include increase risk of skin cancers after many treatments etc discussed with patient. He has used intermittently for approximately 10 years.  Cont otc Vaseline at bedtime  Related Medications ketoconazole (NIZORAL) 2 % shampoo apply three times per week, massage into scalp and leave in for 10 minutes before rinsing out  clobetasol (TEMOVATE) 0.05 % external solution Apply to scalp qd-bid prn Avoid applying to face, groin, and axilla. Use as directed. Risk of skin atrophy with long-term use reviewed.  Seborrheic dermatitis Scalp  Chronic condition with duration or expected duration over one year. Condition is bothersome to patient. Currently flared.  Seborrheic Dermatitis  -  is a chronic persistent rash characterized by pinkness and scaling most commonly of the mid face but also can occur on the scalp (dandruff), ears; mid chest, mid back and groin.  It tends to be exacerbated by stress and cooler weather.  People who have neurologic disease may experience new onset or exacerbation of existing seborrheic dermatitis.  The condition is not curable but treatable and can be controlled.   Start Ketoconazole 2% shampoo apply three times per week, massage into scalp and leave in for 10 minutes before rinsing out  Start Clobetasol solution apply twice daily as needed to scalp Avoid applying to face, groin, and axilla. Use as directed. Risk of skin atrophy with long-term use reviewed.    Topical steroids (such as triamcinolone,  fluocinolone, fluocinonide, mometasone, clobetasol, halobetasol, betamethasone, hydrocortisone) can cause thinning and lightening of the skin if they are used for too long in the same area. Your physician has selected the right strength medicine for your problem and area  affected on the body. Please use your medication only as directed by your physician to prevent side effects.    Lentigines - Scattered tan macules - Due to sun exposure - Benign-appearing, observe - Recommend daily broad spectrum sunscreen SPF 30+ to sun-exposed areas, reapply every 2 hours as needed. - Call for any changes  Seborrheic Keratoses - Stuck-on, waxy, tan-brown papules and/or plaques  - Benign-appearing - Discussed benign etiology and prognosis. - Observe - Call for any changes  Melanocytic Nevi - Tan-brown and/or pink-flesh-colored symmetric macules and papules - Benign appearing on exam today - Observation - Call clinic for new or changing moles - Recommend daily use of broad spectrum spf 30+ sunscreen to sun-exposed areas.   Hemangiomas - Red papules - Discussed benign nature - Observe - Call for any changes  Actinic Damage - Chronic condition, secondary to cumulative UV/sun exposure - diffuse scaly erythematous macules with underlying dyspigmentation - Recommend daily broad spectrum sunscreen SPF 30+ to sun-exposed areas, reapply every 2 hours as needed.  - Staying in the shade or wearing long sleeves, sun glasses (UVA+UVB protection) and wide brim hats (4-inch brim around the entire circumference of the hat) are also recommended for sun protection.  - Call for new or changing lesions.  History of Basal Cell Carcinoma of the Skin Back, chest  - No evidence of recurrence today - Recommend regular full body skin exams - Recommend daily broad spectrum sunscreen SPF 30+ to sun-exposed areas, reapply every 2 hours as needed.  - Call if any new or changing lesions are noted between office visits   Skin cancer screening performed today.   Return in about 1 year (around 03/30/2022) for TBSE, Hx of BCC, hx of psoriasis .  I, Marye Round, CMA, am acting as scribe for Forest Gleason, MD .    Documentation: I have reviewed the above documentation for accuracy and  completeness, and I agree with the above.  Forest Gleason, MD

## 2021-03-30 NOTE — Patient Instructions (Addendum)
Recommend taking Heliocare sun protection supplement daily in sunny weather for additional sun protection. For maximum protection on the sunniest days, you can take up to 2 capsules of regular Heliocare OR take 1 capsule of Heliocare Ultra. For prolonged exposure (such as a full day in the sun), you can repeat your dose of the supplement 4 hours after your first dose. Heliocare can be purchased at [facility] or at [email or web address].    If you have any questions or concerns for your doctor, please call our main line at [phone] and press option 4 to reach your doctor's medical assistant. If no one answers, please leave a voicemail as directed and we will return your call as soon as possible. Messages left after 4 pm will be answered the following business day.   You may also send us a message via MyChart. We typically respond to MyChart messages within 1-2 business days.  For prescription refills, please ask your pharmacy to contact our office. Our fax number is [phone].  If you have an urgent issue when the clinic is closed that cannot wait until the next business day, you can page your doctor at the number below.    Please note that while we do our best to be available for urgent issues outside of office hours, we are not available 24/7.   If you have an urgent issue and are unable to reach us, you may choose to seek medical care at your doctor's office, retail clinic, urgent care center, or emergency room.  If you have a medical emergency, please immediately call 911 or go to the emergency department.  Pager Numbers  - Dr. Kowalski: [phone]  - Dr. Moye: [phone]  - Dr. Stewart: [phone]  In the event of inclement weather, please call our main line at [phone] for an update on the status of any delays or closures.  Dermatology Medication Tips: Please keep the boxes that topical medications come in in order to help keep track of the instructions about  where and how to use these. Pharmacies typically print the medication instructions only on the boxes and not directly on the medication tubes.   If your medication is too expensive, please contact our office at [phone] option 4 or send us a message through MyChart.   We are unable to tell what your co-pay for medications will be in advance as this is different depending on your insurance coverage. However, we may be able to find a substitute medication at lower cost or fill out paperwork to get insurance to cover a needed medication.   If a prior authorization is required to get your medication covered by your insurance company, please allow us 1-2 business days to complete this process.  Drug prices often vary depending on where the prescription is filled and some pharmacies may offer cheaper prices.  The website www.goodrx.com contains coupons for medications through different pharmacies. The prices here do not account for what the cost may be with help from insurance (it may be cheaper with your insurance), but the website can give you the price if you did not use any insurance.  - You can print the associated coupon and take it with your prescription to the pharmacy.  - You may also stop by our office during regular business hours and pick up a GoodRx coupon card.  - If you need your prescription sent electronically to a different pharmacy, notify our office through [facility] MyChart or by phone at [phone] option   4.  

## 2021-04-11 DIAGNOSIS — N400 Enlarged prostate without lower urinary tract symptoms: Secondary | ICD-10-CM | POA: Diagnosis not present

## 2021-04-11 DIAGNOSIS — N200 Calculus of kidney: Secondary | ICD-10-CM | POA: Diagnosis not present

## 2021-04-12 ENCOUNTER — Encounter: Payer: Self-pay | Admitting: Dermatology

## 2021-04-25 ENCOUNTER — Other Ambulatory Visit: Payer: Self-pay

## 2021-04-25 ENCOUNTER — Ambulatory Visit (INDEPENDENT_AMBULATORY_CARE_PROVIDER_SITE_OTHER): Payer: Medicare PPO

## 2021-04-25 DIAGNOSIS — E538 Deficiency of other specified B group vitamins: Secondary | ICD-10-CM

## 2021-04-25 MED ORDER — CYANOCOBALAMIN 1000 MCG/ML IJ SOLN
1000.0000 ug | Freq: Once | INTRAMUSCULAR | Status: AC
  Administered 2021-04-25: 1000 ug via INTRAMUSCULAR

## 2021-04-25 NOTE — Progress Notes (Signed)
Per orders of Dr. Danise Mina, injection of monthly B12 1000 mcg/ml given by Pilar Grammes, CMA in Right Deltoid. Patient tolerated injection well.

## 2021-05-03 DIAGNOSIS — E611 Iron deficiency: Secondary | ICD-10-CM | POA: Diagnosis not present

## 2021-05-03 DIAGNOSIS — N200 Calculus of kidney: Secondary | ICD-10-CM | POA: Diagnosis not present

## 2021-05-03 DIAGNOSIS — N132 Hydronephrosis with renal and ureteral calculous obstruction: Secondary | ICD-10-CM | POA: Diagnosis not present

## 2021-05-03 DIAGNOSIS — Z880 Allergy status to penicillin: Secondary | ICD-10-CM | POA: Diagnosis not present

## 2021-05-03 DIAGNOSIS — I10 Essential (primary) hypertension: Secondary | ICD-10-CM | POA: Diagnosis not present

## 2021-05-03 DIAGNOSIS — K8020 Calculus of gallbladder without cholecystitis without obstruction: Secondary | ICD-10-CM | POA: Diagnosis not present

## 2021-05-03 DIAGNOSIS — N201 Calculus of ureter: Secondary | ICD-10-CM | POA: Diagnosis not present

## 2021-05-03 DIAGNOSIS — K5090 Crohn's disease, unspecified, without complications: Secondary | ICD-10-CM | POA: Diagnosis not present

## 2021-05-16 ENCOUNTER — Other Ambulatory Visit: Payer: Self-pay | Admitting: Family Medicine

## 2021-05-16 NOTE — Telephone Encounter (Signed)
Please call patient and schedule annual physical.

## 2021-05-17 ENCOUNTER — Encounter: Payer: Self-pay | Admitting: Family Medicine

## 2021-05-17 NOTE — Telephone Encounter (Signed)
Pt schedule a lab/cpe on 11.30.00

## 2021-05-17 NOTE — Telephone Encounter (Signed)
Pt scheduled a lab/cpe on 11.30.22

## 2021-05-24 ENCOUNTER — Encounter: Payer: Self-pay | Admitting: Family Medicine

## 2021-05-24 ENCOUNTER — Other Ambulatory Visit: Payer: Self-pay

## 2021-05-24 ENCOUNTER — Ambulatory Visit (INDEPENDENT_AMBULATORY_CARE_PROVIDER_SITE_OTHER): Payer: Medicare PPO | Admitting: Family Medicine

## 2021-05-24 VITALS — BP 132/80 | HR 81 | Temp 98.0°F | Ht 71.25 in | Wt 175.4 lb

## 2021-05-24 DIAGNOSIS — K5080 Crohn's disease of both small and large intestine without complications: Secondary | ICD-10-CM

## 2021-05-24 DIAGNOSIS — D649 Anemia, unspecified: Secondary | ICD-10-CM

## 2021-05-24 DIAGNOSIS — E785 Hyperlipidemia, unspecified: Secondary | ICD-10-CM

## 2021-05-24 DIAGNOSIS — I10 Essential (primary) hypertension: Secondary | ICD-10-CM

## 2021-05-24 DIAGNOSIS — K56609 Unspecified intestinal obstruction, unspecified as to partial versus complete obstruction: Secondary | ICD-10-CM

## 2021-05-24 DIAGNOSIS — E538 Deficiency of other specified B group vitamins: Secondary | ICD-10-CM | POA: Diagnosis not present

## 2021-05-24 DIAGNOSIS — K9089 Other intestinal malabsorption: Secondary | ICD-10-CM

## 2021-05-24 DIAGNOSIS — Z125 Encounter for screening for malignant neoplasm of prostate: Secondary | ICD-10-CM | POA: Diagnosis not present

## 2021-05-24 DIAGNOSIS — L409 Psoriasis, unspecified: Secondary | ICD-10-CM

## 2021-05-24 DIAGNOSIS — Z0000 Encounter for general adult medical examination without abnormal findings: Secondary | ICD-10-CM

## 2021-05-24 DIAGNOSIS — Z7189 Other specified counseling: Secondary | ICD-10-CM

## 2021-05-24 DIAGNOSIS — N200 Calculus of kidney: Secondary | ICD-10-CM | POA: Insufficient documentation

## 2021-05-24 MED ORDER — CYANOCOBALAMIN 1000 MCG/ML IJ SOLN
1000.0000 ug | Freq: Once | INTRAMUSCULAR | Status: AC
  Administered 2021-05-24: 1000 ug via INTRAMUSCULAR

## 2021-05-24 MED ORDER — AMLODIPINE BESYLATE 5 MG PO TABS: 5.0000 mg | ORAL_TABLET | Freq: Every day | ORAL | 3 refills | Status: DC

## 2021-05-24 NOTE — Assessment & Plan Note (Signed)
Advanced directives: has at home. HCPOA is daughter. Will bring copy. New packet provided today.

## 2021-05-24 NOTE — Assessment & Plan Note (Signed)
5m s/p recent stone extraction with stent placement earlier this year.

## 2021-05-24 NOTE — Assessment & Plan Note (Signed)

## 2021-05-24 NOTE — Assessment & Plan Note (Signed)
On colestipol through GI

## 2021-05-24 NOTE — Progress Notes (Signed)
Patient ID: Paul Atkinson, male    DOB: 1951/02/15, 70 y.o.   MRN: [id number]  This visit was conducted in person.  BP 132/80   Pulse 81   Temp 98 F (36.7 C) (Temporal)   Ht 5' 11.25" (1.81 m)   Wt 175 lb 6 oz (79.5 kg)   SpO2 96%   BMI 24.29 kg/m    CC: AMW Subjective:   HPI: Paul Atkinson is a 70 y.o. male presenting on 05/24/2021 for Medicare Wellness   Did not see health advisor this year.   Vision Screening   Right eye Left eye Both eyes  Without correction 20/40 20/50 20/40   With correction     Hearing Screening - Comments:: Wears bilateral hearing aids.  Wearing at today's OV.   [place] Office Visit from 05/24/2021 in [facility]  PHQ-2 Total Score 0       Fall Risk  05/03/2020 03/20/2019 03/13/2018 03/12/2017 10/04/2015  Falls in the past year? 0 0 No No No  Number falls in past yr: - 0 - - -  Risk for fall due to : - Medication side effect - - -  Follow up - Falls evaluation completed;Falls prevention discussed - - -   H/o crohn's ileocolitis s/p ilocecectomy remotely - receives b12 shots monthly. Persistently low levels despite this. Sees Dr Henrene Pastor yearly  Bile salt induced diarrhea - on colestipol.   July 2022 developed intestinal obstruction that resolved on its own.    Has seen rheumatologist - referred by derm for further eval of chronic psoriasis. Tested negative for psoriatic arthritis. Intermittent lower back pain managed with minimal tylenol and advil.    Saw [facility] urology for R 70m distal ureteral stone s/p extraction and stent placement earlier this month, stent since removed.   Psoriasis predominantly to hands - uses ultraviolet light box   Preventative: Colonoscopy - 07/2016, stable. Patent anastomosis. Rec rpt 5 years (Dr. PHenrene Pastor COLONOSCOPY 01/27/2021 - superficial ulceration at ileocolonic anastomosis- benign biopsy, rpt 5 yrs (Henrene Pastor  Prostate cancer screening - minor sxs, attributed to enlarged prostate.  Nocturia x1. Continue monitoring PSA.  Lung cancer screening - not eligible  Flu shot yearly [place], 07/2019, 02/2020, 09/2020 Pneumovax 02/2017, prevnar-13 09/2015  Td 2012, Tdap 07/2020 zostavax 2012  shingrix - 08/2019, 11/2019 Advanced directives: has at home. HCPOA is daughter. Will bring copy.  Seat belt use discussed.  Sunscreen use discussed. No changing moles on skin. Sees derm yearly.  Non smoker  Alcohol - none  Dentist Q6 mo  Eye exam - due. H/o iritis remotely - previously saw Dr BTobe Sos Bowel - no constipation - bile salt diarrhea managed with colestipol  Bladder - no incontinence   Occasional caffeine  Lives alone (widower 2019, lost wife to colon cancer), no pets  Occupation: retired, [employer] part-time handyman   Activity: no regular exercise but stays active  Diet: good water, fruits/vegetables some      Relevant past medical, surgical, family and social history reviewed and updated as indicated. Interim medical history since our last visit reviewed. Allergies and medications reviewed and updated. Outpatient Medications Prior to Visit  Medication Sig Dispense Refill   clobetasol (TEMOVATE) 0.05 % external solution Apply to scalp qd-bid prn Avoid applying to face, groin, and axilla. Use as directed. Risk of skin atrophy with long-term use reviewed. 50 mL 2   colestipol (COLESTID) 1 g tablet Take 4 tablets (4 g total)  by mouth daily. (Patient taking differently: Take 2 g by mouth 2 (two) times daily.) 360 tablet 3   cyanocobalamin (,VITAMIN B-12,) 1000 MCG/ML injection Inject 1,000 mcg into the muscle every 30 (thirty) days. Chronic therapy     docusate sodium (COLACE) 100 MG capsule Take 1 capsule (100 mg total) by mouth daily. (Patient taking differently: Take 100 mg by mouth daily as needed.) 10 capsule 0   fluticasone (FLONASE) 50 MCG/ACT nasal spray Place 1 spray into both nostrils daily.      ketoconazole (NIZORAL) 2 % shampoo apply three  times per week, massage into scalp and leave in for 10 minutes before rinsing out 120 mL 0   amLODipine (NORVASC) 5 MG tablet TAKE ONE TABLET BY MOUTH EVERY DAY 90 tablet 0   0.9 %  sodium chloride infusion      0.9 %  sodium chloride infusion      No facility-administered medications prior to visit.     Per HPI unless specifically indicated in ROS section below Review of Systems  Constitutional:  Negative for activity change, appetite change, chills, fatigue, fever and unexpected weight change.  HENT:  Negative for hearing loss.   Eyes:  Negative for visual disturbance.  Respiratory:  Negative for cough, chest tightness, shortness of breath and wheezing.   Cardiovascular:  Negative for chest pain, palpitations and leg swelling.  Gastrointestinal:  Negative for abdominal distention, abdominal pain, blood in stool, constipation, diarrhea, nausea and vomiting.  Genitourinary:  Positive for hematuria (recent stent placed/removed). Negative for difficulty urinating.  Musculoskeletal:  Negative for arthralgias, myalgias and neck pain.  Skin:  Negative for rash.  Neurological:  Negative for dizziness, seizures, syncope and headaches.  Hematological:  Negative for adenopathy. Does not bruise/bleed easily.  Psychiatric/Behavioral:  Negative for dysphoric mood. The patient is not nervous/anxious.    Objective:  BP 132/80   Pulse 81   Temp 98 F (36.7 C) (Temporal)   Ht 5' 11.25" (1.81 m)   Wt 175 lb 6 oz (79.5 kg)   SpO2 96%   BMI 24.29 kg/m   Wt Readings from Last 3 Encounters:  05/24/21 175 lb 6 oz (79.5 kg)  01/27/21 173 lb (78.5 kg)  01/23/21 173 lb (78.5 kg)      Physical Exam Vitals and nursing note reviewed.  Constitutional:      General: He is not in acute distress.    Appearance: Normal appearance. He is well-developed. He is not ill-appearing.  HENT:     Head: Normocephalic and atraumatic.     Right Ear: Hearing, tympanic membrane, ear canal and external ear normal.      Left Ear: Hearing, tympanic membrane, ear canal and external ear normal.  Eyes:     General: No scleral icterus.    Extraocular Movements: Extraocular movements intact.     Conjunctiva/sclera: Conjunctivae normal.     Pupils: Pupils are equal, round, and reactive to light.  Neck:     Thyroid: No thyroid mass or thyromegaly.     Vascular: No carotid bruit.  Cardiovascular:     Rate and Rhythm: Normal rate and regular rhythm.     Pulses: Normal pulses.          Radial pulses are 2+ on the right side and 2+ on the left side.     Heart sounds: Normal heart sounds. No murmur heard. Pulmonary:     Effort: Pulmonary effort is normal. No respiratory distress.     Breath sounds: Normal  breath sounds. No wheezing, rhonchi or rales.  Abdominal:     General: Bowel sounds are normal. There is no distension.     Palpations: Abdomen is soft. There is no mass.     Tenderness: There is no abdominal tenderness. There is no guarding or rebound.     Hernia: No hernia is present.  Musculoskeletal:        General: Normal range of motion.     Cervical back: Normal range of motion and neck supple.     Right lower leg: No edema.     Left lower leg: No edema.  Lymphadenopathy:     Cervical: No cervical adenopathy.  Skin:    General: Skin is warm and dry.     Findings: No rash.  Neurological:     General: No focal deficit present.     Mental Status: He is alert and oriented to person, place, and time.     Comments:  Recall 3/3 Calculation 5/5 DLROW  Psychiatric:        Mood and Affect: Mood normal.        Behavior: Behavior normal.        Thought Content: Thought content normal.        Judgment: Judgment normal.      Results for orders placed or performed in visit on 01/02/21  Comprehensive metabolic panel  Result Value Ref Range   Sodium 139 135 - 145 mEq/L   Potassium 4.2 3.5 - 5.1 mEq/L   Chloride 104 96 - 112 mEq/L   CO2 26 19 - 32 mEq/L   Glucose, Bld 102 (H) 70 - 99 mg/dL   BUN 10 6  - 23 mg/dL   Creatinine, Ser 0.98 0.40 - 1.50 mg/dL   Total Bilirubin 0.9 0.2 - 1.2 mg/dL   Alkaline Phosphatase 59 39 - 117 U/L   AST 15 0 - 37 U/L   ALT 13 0 - 53 U/L   Total Protein 7.1 6.0 - 8.3 g/dL   Albumin 4.4 3.5 - 5.2 g/dL   GFR 77.84 >60.00 mL/min   Calcium 9.2 8.4 - 10.5 mg/dL  CBC with Differential/Platelet  Result Value Ref Range   WBC 4.7 4.0 - 10.5 K/uL   RBC 4.58 4.22 - 5.81 Mil/uL   Hemoglobin 14.7 13.0 - 17.0 g/dL   HCT 42.1 39.0 - 52.0 %   MCV 91.9 78.0 - 100.0 fl   MCHC 34.9 30.0 - 36.0 g/dL   RDW 13.2 11.5 - 15.5 %   Platelets 250.0 150.0 - 400.0 K/uL   Neutrophils Relative % 67.2 43.0 - 77.0 %   Lymphocytes Relative 17.7 12.0 - 46.0 %   Monocytes Relative 10.9 3.0 - 12.0 %   Eosinophils Relative 3.4 0.0 - 5.0 %   Basophils Relative 0.8 0.0 - 3.0 %   Neutro Abs 3.1 1.4 - 7.7 K/uL   Lymphs Abs 0.8 0.7 - 4.0 K/uL   Monocytes Absolute 0.5 0.1 - 1.0 K/uL   Eosinophils Absolute 0.2 0.0 - 0.7 K/uL   Basophils Absolute 0.0 0.0 - 0.1 K/uL    Assessment & Plan:  This visit occurred during the SARS-CoV-2 public health emergency.  Safety protocols were in place, including screening questions prior to the visit, additional usage of staff PPE, and extensive cleaning of exam room while observing appropriate contact time as indicated for disinfecting solutions.   Problem List Items Addressed This Visit     Healthcare maintenance (Chronic)    Preventative protocols reviewed  and updated unless pt declined. Discussed healthy diet and lifestyle.       Advanced care planning/counseling discussion (Chronic)    Advanced directives: has at home. HCPOA is daughter. Will bring copy. New packet provided today.       Medicare annual wellness visit, subsequent - Primary (Chronic)    I have personally reviewed the Medicare Annual Wellness questionnaire and have noted 1. The patient's medical and social history 2. Their use of alcohol, tobacco or illicit drugs 3. Their  current medications and supplements 4. The patient's functional ability including ADL's, fall risks, home safety risks and hearing or visual impairment. Cognitive function has been assessed and addressed as indicated.  5. Diet and physical activity 6. Evidence for depression or mood disorders The patients weight, height, BMI have been recorded in the chart. I have made referrals, counseling and provided education to the patient based on review of the above and I have provided the pt with a written personalized care plan for preventive services. Provider list updated.. See scanned questionairre as needed for further documentation. Reviewed preventative protocols and updated unless pt declined.       Dyslipidemia    Chronic, only on colestipol as per below - update FLP. The ASCVD Risk score (Arnett DK, et al., 2019) failed to calculate for the following reasons:   The valid total cholesterol range is 130 to 320 mg/dL       Relevant Orders   Lipid panel   Comprehensive metabolic panel   Anemia    Update CBC.       HYPERTENSION, BENIGN ESSENTIAL    Chronic, stable on amlodipine. Continue. Check Umicroalb.       Relevant Medications   amLODipine (NORVASC) 5 MG tablet   Other Relevant Orders   Microalbumin / creatinine urine ratio   SBO (small bowel obstruction) ([place])    Developed 12/2020, resolved with conservative treatment. Saw GI.       Crohn's ileocolitis s/p remote ileostomy   Relevant Orders   CBC with Differential/Platelet   Bile salt-induced diarrhea    On colestipol through GI      Psoriasis    Regularly sees derm.       Vitamin B12 deficiency    S/p colon resection. Continues monthly B12 shots. Update B12 level then shot today.       Relevant Orders   Vitamin B12   Nephrolithiasis    34m s/p recent stone extraction with stent placement earlier this year.       Other Visit Diagnoses     Special screening for malignant neoplasm of prostate        Relevant Orders   PSA        Meds ordered this encounter  Medications   amLODipine (NORVASC) 5 MG tablet    Sig: Take 1 tablet (5 mg total) by mouth daily.    Dispense:  90 tablet    Refill:  3   cyanocobalamin ((VITAMIN B-12)) injection 1,000 mcg    Orders Placed This Encounter  Procedures   Lipid panel   Vitamin B12   Comprehensive metabolic panel   PSA   CBC with Differential/Platelet   Microalbumin / creatinine urine ratio    Patient instructions: Labs today  [place] of your living will.  Schedule eye exam.  Good to see you today Return as needed or in 1 year for next physical  Follow up plan: Return in about 1 year (around 05/24/2022) for annual exam, prior  fasting for blood work, medicare wellness visit.  Ria Bush, MD

## 2021-05-24 NOTE — Assessment & Plan Note (Signed)
Chronic, stable on amlodipine. Continue. Check Umicroalb.

## 2021-05-24 NOTE — Assessment & Plan Note (Signed)
Chronic, only on colestipol as per below - update FLP. The ASCVD Risk score (Arnett DK, et al., 2019) failed to calculate for the following reasons:   The valid total cholesterol range is 130 to 320 mg/dL

## 2021-05-24 NOTE — Assessment & Plan Note (Addendum)
Developed 12/2020, resolved with conservative treatment. Saw GI.

## 2021-05-24 NOTE — Assessment & Plan Note (Signed)
Update CBC. 

## 2021-05-24 NOTE — Patient Instructions (Addendum)
B12 shot today  Labs today  Bring [place] copy of your living will.  Schedule eye exam.  Good to see you today Return as needed or in 1 year for next physical  Health Maintenance After Age 71 After age 6, you are at a higher risk for certain long-term diseases and infections as well as injuries from falls. Falls are a major cause of broken bones and head injuries in people who are older than age 59. Getting regular preventive care can help to keep you healthy and well. Preventive care includes getting regular testing and making lifestyle changes as recommended by your health care provider. Talk with your health care provider about: Which screenings and tests you should have. A screening is a test that checks for a disease when you have no symptoms. A diet and exercise plan that is right for you. What should I know about screenings and tests to prevent falls? Screening and testing are the best ways to find a health problem early. Early diagnosis and treatment give you the best chance of managing medical conditions that are common after age 85. Certain conditions and lifestyle choices may make you more likely to have a fall. Your health care provider may recommend: Regular vision checks. Poor vision and conditions such as cataracts can make you more likely to have a fall. If you wear glasses, make sure to get your prescription updated if your vision changes. Medicine review. Work with your health care provider to regularly review all of the medicines you are taking, including over-the-counter medicines. Ask your health care provider about any side effects that may make you more likely to have a fall. Tell your health care provider if any medicines that you take make you feel dizzy or sleepy. Strength and balance checks. Your health care provider may recommend certain tests to check your strength and balance while standing, walking, or changing positions. Foot health exam. Foot pain and numbness, as well as  not wearing proper footwear, can make you more likely to have a fall. Screenings, including: Osteoporosis screening. Osteoporosis is a condition that causes the bones to get weaker and break more easily. Blood pressure screening. Blood pressure changes and medicines to control blood pressure can make you feel dizzy. Depression screening. You may be more likely to have a fall if you have a fear of falling, feel depressed, or feel unable to do activities that you used to do. Alcohol use screening. Using too much alcohol can affect your balance and may make you more likely to have a fall. Follow these instructions at home: Lifestyle Do not drink alcohol if: Your health care provider tells you not to drink. If you drink alcohol: Limit how much you have to: 0-1 drink a day for women. 0-2 drinks a day for men. Know how much alcohol is in your drink. In the [place] one drink equals one 12 oz bottle of beer (355 mL), one 5 oz glass of wine (148 mL), or one 1 oz glass of hard liquor (44 mL). Do not use any products that contain nicotine or tobacco. These products include cigarettes, chewing tobacco, and vaping devices, such as e-cigarettes. If you need help quitting, ask your health care provider. Activity  Follow a regular exercise program to stay fit. This will help you maintain your balance. Ask your health care provider what types of exercise are appropriate for you. If you need a cane or walker, use it as recommended by your health care provider. Wear supportive  shoes that have nonskid soles. Safety  Remove any tripping hazards, such as rugs, cords, and clutter. Install safety equipment such as grab bars in bathrooms and safety rails on stairs. Keep rooms and walkways well-lit. General instructions Talk with your health care provider about your risks for falling. Tell your health care provider if: You fall. Be sure to tell your health care provider about all falls, even ones that seem  minor. You feel dizzy, tiredness (fatigue), or off-balance. Take over-the-counter and prescription medicines only as told by your health care provider. These include supplements. Eat a healthy diet and maintain a healthy weight. A healthy diet includes low-fat dairy products, low-fat (lean) meats, and fiber from whole grains, beans, and lots of fruits and vegetables. Stay current with your vaccines. Schedule regular health, dental, and eye exams. Summary Having a healthy lifestyle and getting preventive care can help to protect your health and wellness after age 47. Screening and testing are the best way to find a health problem early and help you avoid having a fall. Early diagnosis and treatment give you the best chance for managing medical conditions that are more common for people who are older than age 49. Falls are a major cause of broken bones and head injuries in people who are older than age 23. Take precautions to prevent a fall at home. Work with your health care provider to learn what changes you can make to improve your health and wellness and to prevent falls. This information is not intended to replace advice given to you by your health care provider. Make sure you discuss any questions you have with your health care provider. Document Revised: 10/31/2020 Document Reviewed: 10/31/2020 Elsevier Patient Education  [facility].

## 2021-05-24 NOTE — Assessment & Plan Note (Signed)
Preventative protocols reviewed and updated unless pt declined. Discussed healthy diet and lifestyle.  

## 2021-05-24 NOTE — Assessment & Plan Note (Signed)
Regularly sees derm.

## 2021-05-24 NOTE — Assessment & Plan Note (Signed)
S/p colon resection. Continues monthly B12 shots. Update B12 level then shot today.

## 2021-05-25 LAB — CBC WITH DIFFERENTIAL/PLATELET
Basophils Absolute: 0 10*3/uL (ref 0.0–0.1)
Basophils Relative: 0.6 % (ref 0.0–3.0)
Eosinophils Absolute: 0.2 10*3/uL (ref 0.0–0.7)
Eosinophils Relative: 3.5 % (ref 0.0–5.0)
HCT: 42.7 % (ref 39.0–52.0)
Hemoglobin: 14.8 g/dL (ref 13.0–17.0)
Lymphocytes Relative: 14.4 % (ref 12.0–46.0)
Lymphs Abs: 0.9 10*3/uL (ref 0.7–4.0)
MCHC: 34.7 g/dL (ref 30.0–36.0)
MCV: 92 fl (ref 78.0–100.0)
Monocytes Absolute: 0.6 10*3/uL (ref 0.1–1.0)
Monocytes Relative: 9.9 % (ref 3.0–12.0)
Neutro Abs: 4.3 10*3/uL (ref 1.4–7.7)
Neutrophils Relative %: 71.6 % (ref 43.0–77.0)
Platelets: 246 10*3/uL (ref 150.0–400.0)
RBC: 4.64 Mil/uL (ref 4.22–5.81)
RDW: 12.7 % (ref 11.5–15.5)
WBC: 6 10*3/uL (ref 4.0–10.5)

## 2021-05-25 LAB — COMPREHENSIVE METABOLIC PANEL
ALT: 17 U/L (ref 0–53)
AST: 18 U/L (ref 0–37)
Albumin: 4.6 g/dL (ref 3.5–5.2)
Alkaline Phosphatase: 70 U/L (ref 39–117)
BUN: 14 mg/dL (ref 6–23)
CO2: 27 mEq/L (ref 19–32)
Calcium: 9.4 mg/dL (ref 8.4–10.5)
Chloride: 103 mEq/L (ref 96–112)
Creatinine, Ser: 1.05 mg/dL (ref 0.40–1.50)
GFR: 71.46 mL/min (ref >60.00)
Glucose, Bld: 104 mg/dL — ABNORMAL HIGH (ref 70–99)
Potassium: 4.3 mEq/L (ref 3.5–5.1)
Sodium: 138 mEq/L (ref 135–145)
Total Bilirubin: 1.1 mg/dL (ref 0.2–1.2)
Total Protein: 7.1 g/dL (ref 6.0–8.3)

## 2021-05-25 LAB — LIPID PANEL
Cholesterol: 133 mg/dL (ref 0–200)
HDL: 45 mg/dL (ref >39.00)
LDL Cholesterol: 56 mg/dL (ref 0–99)
NonHDL: 87.56
Total CHOL/HDL Ratio: 3
Triglycerides: 159 mg/dL — ABNORMAL HIGH (ref 0.0–149.0)
VLDL: 31.8 mg/dL (ref 0.0–40.0)

## 2021-05-25 LAB — MICROALBUMIN / CREATININE URINE RATIO
Creatinine,U: 133.5 mg/dL
Microalb Creat Ratio: 0.5 mg/g (ref 0.0–30.0)
Microalb, Ur: <0.7 mg/dL (ref 0.0–1.9)

## 2021-05-25 LAB — VITAMIN B12: Vitamin B-12: >1550 pg/mL — ABNORMAL HIGH (ref 211–911)

## 2021-05-25 LAB — PSA: PSA: 1.91 ng/mL (ref 0.10–4.00)

## 2021-05-31 ENCOUNTER — Telehealth: Payer: Self-pay | Admitting: Family Medicine

## 2021-06-08 ENCOUNTER — Encounter: Payer: Self-pay | Admitting: Dermatology

## 2021-06-27 DIAGNOSIS — N200 Calculus of kidney: Secondary | ICD-10-CM | POA: Diagnosis not present

## 2021-06-30 DIAGNOSIS — N200 Calculus of kidney: Secondary | ICD-10-CM | POA: Diagnosis not present

## 2021-07-14 ENCOUNTER — Other Ambulatory Visit: Payer: Self-pay | Admitting: Internal Medicine

## 2021-08-16 ENCOUNTER — Other Ambulatory Visit: Payer: Self-pay

## 2021-08-16 ENCOUNTER — Ambulatory Visit (INDEPENDENT_AMBULATORY_CARE_PROVIDER_SITE_OTHER): Payer: Medicare PPO

## 2021-08-16 DIAGNOSIS — E538 Deficiency of other specified B group vitamins: Secondary | ICD-10-CM

## 2021-08-16 MED ORDER — CYANOCOBALAMIN 1000 MCG/ML IJ SOLN
1000.0000 ug | Freq: Once | INTRAMUSCULAR | Status: AC
  Administered 2021-08-16: 1000 ug via INTRAMUSCULAR

## 2021-08-16 NOTE — Progress Notes (Addendum)
Per orders of Dr. Glori Bickers, injection of vit V51 given by Brenton Grills. Patient tolerated injection well.

## 2021-09-07 DIAGNOSIS — H401131 Primary open-angle glaucoma, bilateral, mild stage: Secondary | ICD-10-CM | POA: Diagnosis not present

## 2021-10-03 DIAGNOSIS — H25813 Combined forms of age-related cataract, bilateral: Secondary | ICD-10-CM | POA: Diagnosis not present

## 2021-11-09 ENCOUNTER — Ambulatory Visit (INDEPENDENT_AMBULATORY_CARE_PROVIDER_SITE_OTHER): Payer: Medicare PPO

## 2021-11-09 DIAGNOSIS — E538 Deficiency of other specified B group vitamins: Secondary | ICD-10-CM | POA: Diagnosis not present

## 2021-11-09 MED ORDER — CYANOCOBALAMIN 1000 MCG/ML IJ SOLN
1000.0000 ug | Freq: Once | INTRAMUSCULAR | Status: AC
  Administered 2021-11-09: 1000 ug via INTRAMUSCULAR

## 2021-11-09 NOTE — Progress Notes (Signed)
Per orders of Dr. Damita Dunnings, injection of vit R67 given by Brenton Grills. Patient tolerated injection well.

## 2021-12-20 DIAGNOSIS — H2522 Age-related cataract, morgagnian type, left eye: Secondary | ICD-10-CM | POA: Diagnosis not present

## 2021-12-20 DIAGNOSIS — H2512 Age-related nuclear cataract, left eye: Secondary | ICD-10-CM | POA: Diagnosis not present

## 2022-01-03 DIAGNOSIS — H2511 Age-related nuclear cataract, right eye: Secondary | ICD-10-CM | POA: Diagnosis not present

## 2022-01-03 DIAGNOSIS — I10 Essential (primary) hypertension: Secondary | ICD-10-CM | POA: Diagnosis not present

## 2022-01-03 DIAGNOSIS — H268 Other specified cataract: Secondary | ICD-10-CM | POA: Diagnosis not present

## 2022-01-05 NOTE — Telephone Encounter (Signed)
error 

## 2022-01-10 ENCOUNTER — Ambulatory Visit (INDEPENDENT_AMBULATORY_CARE_PROVIDER_SITE_OTHER): Payer: Medicare PPO | Admitting: *Deleted

## 2022-01-10 DIAGNOSIS — E538 Deficiency of other specified B group vitamins: Secondary | ICD-10-CM

## 2022-01-10 MED ORDER — CYANOCOBALAMIN 1000 MCG/ML IJ SOLN
1000.0000 ug | Freq: Once | INTRAMUSCULAR | Status: AC
  Administered 2022-01-10: 1000 ug via INTRAMUSCULAR

## 2022-01-10 NOTE — Progress Notes (Signed)
Per orders of Dr. Danise Mina, injection of vitamin B-12  given by Emelia Salisbury. Patient tolerated injection well.

## 2022-01-15 ENCOUNTER — Encounter: Payer: Self-pay | Admitting: Internal Medicine

## 2022-02-14 ENCOUNTER — Other Ambulatory Visit (INDEPENDENT_AMBULATORY_CARE_PROVIDER_SITE_OTHER): Payer: Medicare PPO

## 2022-02-14 ENCOUNTER — Ambulatory Visit (INDEPENDENT_AMBULATORY_CARE_PROVIDER_SITE_OTHER): Payer: Medicare PPO

## 2022-02-14 ENCOUNTER — Other Ambulatory Visit: Payer: Self-pay | Admitting: Family Medicine

## 2022-02-14 DIAGNOSIS — E538 Deficiency of other specified B group vitamins: Secondary | ICD-10-CM | POA: Diagnosis not present

## 2022-02-14 LAB — VITAMIN B12: Vitamin B-12: 198 pg/mL — ABNORMAL LOW (ref 211–911)

## 2022-02-14 MED ORDER — CYANOCOBALAMIN 1000 MCG/ML IJ SOLN
1000.0000 ug | Freq: Once | INTRAMUSCULAR | Status: AC
  Administered 2022-02-14: 1000 ug via INTRAMUSCULAR

## 2022-02-14 NOTE — Progress Notes (Signed)
Patient presented for B 12 injection given by Sherrilee Gilles, CMA to left deltoid, patient voiced no concerns nor showed any signs of distress during injection.

## 2022-03-07 ENCOUNTER — Encounter: Payer: Self-pay | Admitting: Family Medicine

## 2022-03-20 ENCOUNTER — Ambulatory Visit (INDEPENDENT_AMBULATORY_CARE_PROVIDER_SITE_OTHER): Payer: Medicare PPO | Admitting: *Deleted

## 2022-03-20 ENCOUNTER — Ambulatory Visit (INDEPENDENT_AMBULATORY_CARE_PROVIDER_SITE_OTHER): Payer: Medicare PPO

## 2022-03-20 DIAGNOSIS — Z23 Encounter for immunization: Secondary | ICD-10-CM | POA: Diagnosis not present

## 2022-03-20 DIAGNOSIS — E538 Deficiency of other specified B group vitamins: Secondary | ICD-10-CM | POA: Diagnosis not present

## 2022-03-20 MED ORDER — CYANOCOBALAMIN 1000 MCG/ML IJ SOLN
1000.0000 ug | Freq: Once | INTRAMUSCULAR | Status: AC
  Administered 2022-03-20: 1000 ug via INTRAMUSCULAR

## 2022-03-20 NOTE — Progress Notes (Signed)
Per orders of Dr. Danise Mina, injection of Vitamin B 12 given by Emelia Salisbury. Patient tolerated injection well.

## 2022-03-30 ENCOUNTER — Ambulatory Visit: Payer: Medicare PPO | Admitting: Internal Medicine

## 2022-03-30 ENCOUNTER — Encounter: Payer: Self-pay | Admitting: Internal Medicine

## 2022-03-30 VITALS — BP 120/72 | HR 79 | Ht 71.5 in | Wt 177.0 lb

## 2022-03-30 DIAGNOSIS — K9089 Other intestinal malabsorption: Secondary | ICD-10-CM

## 2022-03-30 DIAGNOSIS — E538 Deficiency of other specified B group vitamins: Secondary | ICD-10-CM | POA: Diagnosis not present

## 2022-03-30 DIAGNOSIS — K50018 Crohn's disease of small intestine with other complication: Secondary | ICD-10-CM

## 2022-03-30 MED ORDER — COLESTIPOL HCL 1 G PO TABS: ORAL_TABLET | ORAL | 3 refills | Status: DC

## 2022-03-30 NOTE — Patient Instructions (Signed)
_______________________________________________________  If you are age 72 or older, your body mass index should be between 23-30. Your Body mass index is 24.34 kg/m. If this is out of the aforementioned range listed, please consider follow up with your Primary Care Provider.  If you are age 12 or younger, your body mass index should be between 19-25. Your Body mass index is 24.34 kg/m. If this is out of the aformentioned range listed, please consider follow up with your Primary Care Provider.   ________________________________________________________  The [facility] providers would like to encourage you to use [facility] to communicate with providers for non-urgent requests or questions.  Due to long hold times on the telephone, sending your provider a message by [facility] may be a faster and more efficient way to get a response.  Please allow 48 business hours for a response.  Please remember that this is for non-urgent requests.  _______________________________________________________  We have sent the following medications to your pharmacy for you to pick up at your convenience:  Colestid  Please follow up in one year

## 2022-03-30 NOTE — Progress Notes (Signed)
HISTORY OF PRESENT ILLNESS:  Paul Atkinson is a 71 y.o. male with a history of Crohn's ileocolitis for which he is status post remote ileocecectomy at [facility] greater than 20 years ago.  He also has a history of postoperative bile salt induced diarrhea for which she is on Colestid and B12 deficiency for which she is on replacement.  I last saw the patient January 27, 2021 when he underwent colonoscopy after having had recent problems with abdominal pain and obstructive symptoms.  Colonoscopy revealed superficial ulceration at the ileocolonic anastomosis and noncritical narrowing of the anastomosis.  The visualized Neo ileum was unremarkable as was the colon.  His symptoms were felt secondary to adhesions.  He presents today for follow-up.  He has had no recurrent issues with abdominal pain.  He continues to have good control of diarrhea with Colestid, though he did mention that the pill size has increased significantly.  He inquires about alternatives.  Review of blood work From February 14, 2022 shows low B12 level at 198.  He tells me that he had missed a few doses.  Now back on replacement.  No new complaints.  He does inquire about the possibility of using limited amounts of NSAIDs for musculoskeletal pain.  REVIEW OF SYSTEMS:  All non-GI ROS negative unless otherwise stated in the HPI except for arthritis, back pain, visual change, hearing problems, muscle cramps, urinary frequency  Past Medical History:  Diagnosis Date   Allergy    mild   Anemia    iron def   Basal cell carcinoma    back, chest    Bile salt-induced diarrhea    Crohn disease ([place])    status post ileocecectomy 1989   Disorder of inner ear    Hearing loss    Hx of duodenal ulcer    Hx SBO    Hypertension    Kidney stones    Malabsorption    Psoriasis    Ulcer    Vitamin B 12 deficiency    secondary to ileal resection    Past Surgical History:  Procedure Laterality Date   COLONOSCOPY  07/2016   patent anastomosis,  no polyps, rpt 5 yrs Henrene Pastor)   COLONOSCOPY  01/27/2021   superficial ulceration at ileocolonic anastomosis- benign biopsy, rpt 5 yrs Henrene Pastor)   [place]   peritonitis with SBO from crohn's   KIDNEY STONE SURGERY     removal   MOLE REMOVAL     back   MOUTH SURGERY     SEPTOPLASTY  05/2004   due to deviated septum (Dr. Myna Hidalgo)    Social History [name]  reports that he has never smoked. He has never used smokeless tobacco. He reports that he does not drink alcohol and does not use drugs.  family history includes CAD (age of onset: 68) in his father; Diabetes (age of onset: 87) in his father; Heart disease in his mother; Hyperlipidemia in his father; Hypertension in his mother; Kidney disease in his father and mother; Rectal cancer in his sister.  Allergies  Allergen Reactions   Penicillins     REACTION: RASH in throat       PHYSICAL EXAMINATION: Vital signs: BP 120/72   Pulse 79   Ht 5' 11.5" (1.816 m)   Wt 177 lb (80.3 kg)   SpO2 96%   BMI 24.34 kg/m   Constitutional: generally well-appearing, no acute distress Psychiatric: alert and oriented x3, cooperative Eyes: extraocular movements intact, anicteric, conjunctiva pink Mouth:  oral pharynx moist, no lesions Neck: supple no lymphadenopathy Cardiovascular: heart regular rate and rhythm, no murmur Lungs: clear to auscultation bilaterally Abdomen: soft, nontender, nondistended, no obvious ascites, no peritoneal signs, normal bowel sounds, no organomegaly Rectal: Omitted Extremities: no clubbing, cyanosis, or lower extremity edema bilaterally Skin: no lesions on visible extremities Neuro: No focal deficits.  Cranial nerves intact  ASSESSMENT:   1.  History of Crohn's ileocolitis status post remote ileocecectomy.  Asymptomatic. 2.  Bile salt induced diarrhea controlled with Colestid 3.  B12 deficiency.  Recently with low level.  We discussed regular replacement as he had done in the past. 4.  Sister  with rectal cancer 5.  Surveillance colonoscopy August 2022 as described 6.  Infrequent problems with transient obstructive symptoms consistent with adhesive disease     PLAN:   1.  Continue Colestid.  Prescription refilled 2.  Continue B12 replacement 3.  Surveillance colonoscopy around August 2027 4.  Okay to take NSAIDs sparingly if needed 5.  Routine office follow-up 1 year.  Contact the office in the interim for any questions or problems

## 2022-04-05 ENCOUNTER — Ambulatory Visit: Payer: Medicare PPO | Admitting: Dermatology

## 2022-04-05 ENCOUNTER — Other Ambulatory Visit: Payer: Self-pay

## 2022-04-05 ENCOUNTER — Encounter: Payer: Self-pay | Admitting: Dermatology

## 2022-04-05 DIAGNOSIS — D229 Melanocytic nevi, unspecified: Secondary | ICD-10-CM

## 2022-04-05 DIAGNOSIS — L821 Other seborrheic keratosis: Secondary | ICD-10-CM

## 2022-04-05 DIAGNOSIS — L219 Seborrheic dermatitis, unspecified: Secondary | ICD-10-CM | POA: Diagnosis not present

## 2022-04-05 DIAGNOSIS — Z85828 Personal history of other malignant neoplasm of skin: Secondary | ICD-10-CM | POA: Diagnosis not present

## 2022-04-05 DIAGNOSIS — L578 Other skin changes due to chronic exposure to nonionizing radiation: Secondary | ICD-10-CM | POA: Diagnosis not present

## 2022-04-05 DIAGNOSIS — D225 Melanocytic nevi of trunk: Secondary | ICD-10-CM | POA: Diagnosis not present

## 2022-04-05 DIAGNOSIS — L409 Psoriasis, unspecified: Secondary | ICD-10-CM | POA: Diagnosis not present

## 2022-04-05 DIAGNOSIS — D1801 Hemangioma of skin and subcutaneous tissue: Secondary | ICD-10-CM | POA: Diagnosis not present

## 2022-04-05 DIAGNOSIS — L814 Other melanin hyperpigmentation: Secondary | ICD-10-CM

## 2022-04-05 DIAGNOSIS — Z1283 Encounter for screening for malignant neoplasm of skin: Secondary | ICD-10-CM

## 2022-04-05 MED ORDER — KETOCONAZOLE 2 % EX CREA: 1.0000 | TOPICAL_CREAM | Freq: Two times a day (BID) | CUTANEOUS | 11 refills | Status: DC | PRN

## 2022-04-05 MED ORDER — HYDROCORTISONE 2.5 % EX CREA: TOPICAL_CREAM | Freq: Two times a day (BID) | CUTANEOUS | 5 refills | Status: AC | PRN

## 2022-04-05 MED ORDER — KETOCONAZOLE 2 % EX SHAM: MEDICATED_SHAMPOO | CUTANEOUS | 11 refills | Status: DC

## 2022-04-05 MED ORDER — KETOCONAZOLE 2 % EX SHAM: MEDICATED_SHAMPOO | CUTANEOUS | 11 refills | Status: AC

## 2022-04-05 NOTE — Progress Notes (Signed)
Change of quantity due to insurance coverage. aw

## 2022-04-05 NOTE — Progress Notes (Signed)
Follow-Up Visit   Subjective  Paul Atkinson is a 71 y.o. male who presents for the following: Annual Exam (The patient presents for Total-Body Skin Exam (TBSE) for skin cancer screening and mole check.  The patient has spots, moles and lesions to be evaluated, some may be new or changing and the patient has concerns that these could be cancer. Patient with hx of BCC. ), Psoriasis (Patient with hx at hands. He has a light box at home but has not had to use anything to treat for about 6 months. ), and Seborrheic Dermatitis (Patient using ketoconazole shampoo, needs refills. ).  Patient with Crohn's disease and has noticed some stinging at face. He would like to know if there's anything he can use.   The following portions of the chart were reviewed this encounter and updated as appropriate:   Tobacco  Allergies  Meds  Problems  Med Hx  Surg Hx  Fam Hx      Review of Systems:  No other skin or systemic complaints except as noted in HPI or Assessment and Plan.  Objective  Well appearing patient in no apparent distress; mood and affect are within normal limits.  A full examination was performed including scalp, head, eyes, ears, nose, lips, neck, chest, axillae, abdomen, back, buttocks, bilateral upper extremities, bilateral lower extremities, hands, feet, fingers, toes, fingernails, and toenails. All findings within normal limits unless otherwise noted below.  hands Scaly pink plaques at hands  Scalp Pink patches with greasy scale.   Mid Back right of midline 0.5 cm medium to slightly dark brown thin papule with perifollicular dropout         Assessment & Plan  Psoriasis hands  Chronic and persistent condition with duration over one year. Condition is symptomatic/ bothersome to patient. Not currently at goal.  Psoriasis is a chronic non-curable, but treatable genetic/hereditary disease that may have other systemic features affecting other organ systems such as joints  (Psoriatic Arthritis). It is associated with an increased risk of inflammatory bowel disease, heart disease, non-alcoholic fatty liver disease, and depression.    Start samples of Zoryve to affected areas at hands once daily as needed.  Lot # [id number]  Exp: 05/2023  Start clobetasol 0.05% foam 1-2 times daily as needed for up to 2 weeks to hands. Avoid applying to face, groin, and axilla. Use as directed. Long-term use can cause thinning of the skin.  Topical steroids (such as triamcinolone, fluocinolone, fluocinonide, mometasone, clobetasol, halobetasol, betamethasone, hydrocortisone) can cause thinning and lightening of the skin if they are used for too long in the same area. Your physician has selected the right strength medicine for your problem and area affected on the body. Please use your medication only as directed by your physician to prevent side effects.    Seborrheic dermatitis Scalp  Chronic and persistent condition with duration or expected duration over one year. Condition is bothersome/symptomatic for patient. Currently flared.  Continue ketoconazole 2% shampoo apply three times per week, massage into scalp, behind ears and leave in for 10 minutes before rinsing out  Start ketoconazole 2% cream 1-2 times daily as needed to affected areas at face. Start HC 2.5% cream 1-2 times daily for up to 1 week to affected areas at face as needed for flares.  Start clobetasol 0.05% foam 1-2 times daily as needed for up to 2 weeks to scalp, ears. Avoid applying to face, groin, and axilla. Use as directed. Long-term use can cause thinning of the  skin.  Topical steroids (such as triamcinolone, fluocinolone, fluocinonide, mometasone, clobetasol, halobetasol, betamethasone, hydrocortisone) can cause thinning and lightening of the skin if they are used for too long in the same area. Your physician has selected the right strength medicine for your problem and area affected on the body. Please use your  medication only as directed by your physician to prevent side effects.   Seborrheic Dermatitis  -  is a chronic persistent rash characterized by pinkness and scaling most commonly of the mid face but also can occur on the scalp (dandruff), ears; mid chest, mid back and groin.  It tends to be exacerbated by stress and cooler weather.  People who have neurologic disease may experience new onset or exacerbation of existing seborrheic dermatitis.  The condition is not curable but treatable and can be controlled.   ketoconazole (NIZORAL) 2 % shampoo - Scalp apply three times per week, massage into scalp, behind ears and leave in for 10 minutes before rinsing out  hydrocortisone 2.5 % cream - Scalp Apply topically 2 (two) times daily as needed (Rash).  ketoconazole (NIZORAL) 2 % cream - Scalp Apply 1 Application topically 2 (two) times daily as needed for irritation.  Nevus Mid Back right of midline  Benign appearing, observe.  Will monitor for changes.   History of Basal Cell Carcinoma of the Skin - No evidence of recurrence today - Recommend regular full body skin exams - Recommend daily broad spectrum sunscreen SPF 30+ to sun-exposed areas, reapply every 2 hours as needed.  - Call if any new or changing lesions are noted between office visits  Lentigines - Scattered tan macules - Due to sun exposure - Benign-appearing, observe - Recommend daily broad spectrum sunscreen SPF 30+ to sun-exposed areas, reapply every 2 hours as needed. - Call for any changes  Seborrheic Keratoses - Stuck-on, waxy, tan-brown papules and/or plaques  - Benign-appearing - Discussed benign etiology and prognosis. - Observe - Call for any changes  Melanocytic Nevi - Tan-brown and/or pink-flesh-colored symmetric macules and papules - Benign appearing on exam today - Observation - Call clinic for new or changing moles - Recommend daily use of broad spectrum spf 30+ sunscreen to sun-exposed areas.    Hemangiomas - Red papules - Discussed benign nature - Observe - Call for any changes  Actinic Damage - Chronic condition, secondary to cumulative UV/sun exposure - diffuse scaly erythematous macules with underlying dyspigmentation - Recommend daily broad spectrum sunscreen SPF 30+ to sun-exposed areas, reapply every 2 hours as needed.  - Staying in the shade or wearing long sleeves, sun glasses (UVA+UVB protection) and wide brim hats (4-inch brim around the entire circumference of the hat) are also recommended for sun protection.  - Call for new or changing lesions.  Skin cancer screening performed today.  Return in about 4 months (around 08/06/2022) for recheck nevus, Seb Derm, Psoriasis, 1 year TBSE.  Graciella Belton, RMA, am acting as scribe for Forest Gleason, MD .  Documentation: I have reviewed the above documentation for accuracy and completeness, and I agree with the above.  Forest Gleason, MD

## 2022-04-05 NOTE — Patient Instructions (Addendum)
Continue ketoconazole 2% shampoo apply three times per week, massage into scalp, behind ears and leave in for 10 minutes before rinsing out  Start ketoconazole 2% cream 1-2 times daily as needed to affected areas at face, behind ears. Start HC 2.5% cream 1-2 times daily for up to 1 week to affected areas at face, behind ears as needed for flares.  Start clobetasol 0.05% foam 1-2 times daily as needed for up to 2 weeks to scalp, ears and can use at hands for psoriasis. Avoid applying to face, groin, and axilla. Use as directed. Long-term use can cause thinning of the skin.  Start samples of Zoryve to affected areas at hands once daily as needed.   Topical steroids (such as triamcinolone, fluocinolone, fluocinonide, mometasone, clobetasol, halobetasol, betamethasone, hydrocortisone) can cause thinning and lightening of the skin if they are used for too long in the same area. Your physician has selected the right strength medicine for your problem and area affected on the body. Please use your medication only as directed by your physician to prevent side effects.   Recommend taking Heliocare sun protection supplement daily in sunny weather for additional sun protection. For maximum protection on the sunniest days, you can take up to 2 capsules of regular Heliocare OR take 1 capsule of Heliocare Ultra. For prolonged exposure (such as a full day in the sun), you can repeat your dose of the supplement 4 hours after your first dose. Heliocare can be purchased at [employer], at some [facility] or at [email or web address].    Melanoma ABCDEs  Melanoma is the most dangerous type of skin cancer, and is the leading cause of death from skin disease.  You are more likely to develop melanoma if you: Have light-colored skin, light-colored eyes, or red or blond hair Spend a lot of time in the sun Tan regularly, either outdoors or in a tanning bed Have had blistering sunburns, especially during childhood Have  a close family member who has had a melanoma Have atypical moles or large birthmarks  Early detection of melanoma is key since treatment is typically straightforward and cure rates are extremely high if we catch it early.   The first sign of melanoma is often a change in a mole or a new dark spot.  The ABCDE system is a way of remembering the signs of melanoma.  A for asymmetry:  The two halves do not match. B for border:  The edges of the growth are irregular. C for color:  A mixture of colors are present instead of an even brown color. D for diameter:  Melanomas are usually (but not always) greater than 11m - the size of a pencil eraser. E for evolution:  The spot keeps changing in size, shape, and color.  Please check your skin once per month between visits. You can use a small mirror in front and a large mirror behind you to keep an eye on the back side or your body.   If you see any new or changing lesions before your next follow-up, please call to schedule a visit.  Please continue daily skin protection including broad spectrum sunscreen SPF 30+ to sun-exposed areas, reapplying every 2 hours as needed when you're outdoors.    Due to recent changes in healthcare laws, you may see results of your pathology and/or laboratory studies on MyChart before the doctors have had a chance to review them. We understand that in some cases there may be results that are confusing or  concerning to you. Please understand that not all results are received at the same time and often the doctors may need to interpret multiple results in order to provide you with the best plan of care or course of treatment. Therefore, we ask that you please give [place] 2 business days to thoroughly review all your results before contacting the office for clarification. Should we see a critical lab result, you will be contacted sooner.   If You Need Anything After Your Visit  If you have any questions or concerns for your  doctor, please call our main line at [phone] and press option 4 to reach your doctor's medical assistant. If no one answers, please leave a voicemail as directed and we will return your call as soon as possible. Messages left after 4 pm will be answered the following business day.   You may also send [place] a message via [place]. We typically respond to MyChart messages within 1-2 business days.  For prescription refills, please ask your pharmacy to contact our office. Our fax number is [phone].  If you have an urgent issue when the clinic is closed that cannot wait until the next business day, you can page your doctor at the number below.    Please note that while we do our best to be available for urgent issues outside of office hours, we are not available 24/7.   If you have an urgent issue and are unable to reach [place], you may choose to seek medical care at your doctor's office, retail clinic, urgent care center, or emergency room.  If you have a medical emergency, please immediately call 911 or go to the emergency department.  Pager Numbers  - Dr. Nehemiah Massed: [phone]  - Dr. Laurence Ferrari: [phone]  - Dr. Nicole Kindred: [phone]  In the event of inclement weather, please call our main line at [phone] for an update on the status of any delays or closures.  Dermatology Medication Tips: Please keep the boxes that topical medications come in in order to help keep track of the instructions about where and how to use these. Pharmacies typically print the medication instructions only on the boxes and not directly on the medication tubes.   If your medication is too expensive, please contact our office at [phone] option 4 or send [place] a message through [place].   We are unable to tell what your co-pay for medications will be in advance as this is different depending on your insurance coverage. However, we may be able to find a substitute medication at lower cost or fill out paperwork  to get insurance to cover a needed medication.   If a prior authorization is required to get your medication covered by your insurance company, please allow [place] 1-2 business days to complete this process.  Drug prices often vary depending on where the prescription is filled and some pharmacies may offer cheaper prices.  The website www.goodrx.com contains coupons for medications through different pharmacies. The prices here do not account for what the cost may be with help from insurance (it may be cheaper with your insurance), but the website can give you the price if you did not use any insurance.  - You can print the associated coupon and take it with your prescription to the pharmacy.  - You may also stop by our office during regular business hours and pick up a GoodRx coupon card.  - If you need your prescription sent electronically to a different pharmacy, notify our office through  [place] or by phone at [phone] option 4.     Si Usted Necesita Algo Despus de Su Visita  Tambin puede enviarnos un mensaje a travs de Pharmacist, community. Por lo general respondemos a los mensajes de MyChart en el transcurso de 1 a 2 das hbiles.  Para renovar recetas, por favor pida a su farmacia que se ponga en contacto con nuestra oficina. Harland Dingwall de fax es [place] [phone].  Si tiene un asunto urgente cuando la clnica est cerrada y que no puede esperar hasta el siguiente da hbil, puede llamar/localizar a su doctor(a) al nmero que aparece a continuacin.   Por favor, tenga en cuenta que aunque hacemos todo lo posible para estar disponibles para asuntos urgentes fuera del horario de [place], no estamos disponibles las 24 horas del da, los 7 das de la [place].   Si tiene un problema urgente y no puede comunicarse con nosotros, puede optar por buscar atencin mdica  en el consultorio de su doctor(a), en una clnica privada, en un centro de atencin urgente o en una sala de  emergencias.  Si tiene Engineering geologist, por favor llame inmediatamente al 911 o vaya a la sala de emergencias.  Nmeros de bper  - Dr. Nehemiah Massed: [phone]  - Dra. Moye: [phone]  - Dra. Nicole Kindred: [phone]  En caso de inclemencias del [place], por favor llame a Johnsie Kindred principal al [phone] para una actualizacin sobre el [place] de cualquier retraso o cierre.  Consejos para la medicacin en dermatologa: Por favor, guarde las cajas en las que vienen los medicamentos de uso tpico para ayudarle a seguir las instrucciones sobre dnde y cmo usarlos. Las farmacias generalmente imprimen las instrucciones del medicamento slo en las cajas y no directamente en los tubos del [place].   Si su medicamento es muy caro, por favor, pngase en contacto con Zigmund Daniel llamando al [phone] y presione la opcin 4 o envenos un mensaje a travs de Pharmacist, community.   No podemos decirle cul ser su copago por los medicamentos por adelantado ya que esto es diferente dependiendo de la cobertura de su seguro. Sin embargo, es posible que podamos encontrar un medicamento sustituto a Electrical engineer un formulario para que el seguro cubra el medicamento que se considera necesario.   Si se requiere una autorizacin previa para que su compaa de seguros [place] su medicamento, por favor permtanos de 1 a 2 das hbiles para completar este proceso.  Los precios de los medicamentos varan con frecuencia dependiendo del Environmental consultant de dnde se surte la receta y alguna farmacias pueden ofrecer precios ms baratos.  El sitio web www.goodrx.com tiene cupones para medicamentos de Airline pilot. Los precios aqu no tienen en cuenta lo que podra costar con la ayuda del seguro (puede ser ms barato con su seguro), pero el sitio web puede darle el precio si no utiliz Research scientist (physical sciences).  - Puede imprimir el cupn correspondiente y llevarlo con su receta a la farmacia.  - Tambin puede pasar por  nuestra oficina durante el horario de atencin regular y Charity fundraiser una tarjeta de cupones de GoodRx.  - Si necesita que su receta se enve electrnicamente a una farmacia diferente, informe a nuestra oficina a travs de MyChart de [place] o por telfono llamando al [phone] y presione la opcin 4.

## 2022-04-06 ENCOUNTER — Encounter: Payer: Self-pay | Admitting: Dermatology

## 2022-05-10 ENCOUNTER — Ambulatory Visit (INDEPENDENT_AMBULATORY_CARE_PROVIDER_SITE_OTHER): Payer: Medicare PPO

## 2022-05-10 DIAGNOSIS — E538 Deficiency of other specified B group vitamins: Secondary | ICD-10-CM

## 2022-05-10 MED ORDER — CYANOCOBALAMIN 1000 MCG/ML IJ SOLN
1000.0000 ug | Freq: Once | INTRAMUSCULAR | Status: AC
  Administered 2022-05-10: 1000 ug via INTRAMUSCULAR

## 2022-05-10 NOTE — Progress Notes (Signed)
Per orders of Dr. Elsie Stain, injection of B-12 given by Francella Solian in left deltoid. Patient tolerated injection well. Patient will make appointment for 1 month.

## 2022-05-20 ENCOUNTER — Other Ambulatory Visit: Payer: Self-pay | Admitting: Family Medicine

## 2022-05-20 DIAGNOSIS — Z125 Encounter for screening for malignant neoplasm of prostate: Secondary | ICD-10-CM

## 2022-05-20 DIAGNOSIS — D649 Anemia, unspecified: Secondary | ICD-10-CM

## 2022-05-20 DIAGNOSIS — I10 Essential (primary) hypertension: Secondary | ICD-10-CM

## 2022-05-20 DIAGNOSIS — E785 Hyperlipidemia, unspecified: Secondary | ICD-10-CM

## 2022-05-20 DIAGNOSIS — E538 Deficiency of other specified B group vitamins: Secondary | ICD-10-CM

## 2022-05-21 ENCOUNTER — Other Ambulatory Visit (INDEPENDENT_AMBULATORY_CARE_PROVIDER_SITE_OTHER): Payer: Medicare PPO

## 2022-05-21 DIAGNOSIS — E538 Deficiency of other specified B group vitamins: Secondary | ICD-10-CM

## 2022-05-21 DIAGNOSIS — I10 Essential (primary) hypertension: Secondary | ICD-10-CM

## 2022-05-21 DIAGNOSIS — E785 Hyperlipidemia, unspecified: Secondary | ICD-10-CM | POA: Diagnosis not present

## 2022-05-21 DIAGNOSIS — Z125 Encounter for screening for malignant neoplasm of prostate: Secondary | ICD-10-CM

## 2022-05-21 DIAGNOSIS — D649 Anemia, unspecified: Secondary | ICD-10-CM | POA: Diagnosis not present

## 2022-05-21 LAB — LIPID PANEL
Cholesterol: 123 mg/dL (ref 0–200)
HDL: 41.2 mg/dL (ref >39.00)
NonHDL: 82.26
Total CHOL/HDL Ratio: 3
Triglycerides: 242 mg/dL — ABNORMAL HIGH (ref 0.0–149.0)
VLDL: 48.4 mg/dL — ABNORMAL HIGH (ref 0.0–40.0)

## 2022-05-21 LAB — MICROALBUMIN / CREATININE URINE RATIO
Creatinine,U: 112.5 mg/dL
Microalb Creat Ratio: 0.6 mg/g (ref 0.0–30.0)
Microalb, Ur: <0.7 mg/dL (ref 0.0–1.9)

## 2022-05-21 LAB — CBC WITH DIFFERENTIAL/PLATELET
Basophils Absolute: 0 10*3/uL (ref 0.0–0.1)
Basophils Relative: 0.6 % (ref 0.0–3.0)
Eosinophils Absolute: 0.2 10*3/uL (ref 0.0–0.7)
Eosinophils Relative: 4.1 % (ref 0.0–5.0)
HCT: 42.4 % (ref 39.0–52.0)
Hemoglobin: 14.8 g/dL (ref 13.0–17.0)
Lymphocytes Relative: 15.8 % (ref 12.0–46.0)
Lymphs Abs: 0.9 10*3/uL (ref 0.7–4.0)
MCHC: 35 g/dL (ref 30.0–36.0)
MCV: 94 fl (ref 78.0–100.0)
Monocytes Absolute: 0.5 10*3/uL (ref 0.1–1.0)
Monocytes Relative: 9 % (ref 3.0–12.0)
Neutro Abs: 3.9 10*3/uL (ref 1.4–7.7)
Neutrophils Relative %: 70.5 % (ref 43.0–77.0)
Platelets: 265 10*3/uL (ref 150.0–400.0)
RBC: 4.51 Mil/uL (ref 4.22–5.81)
RDW: 12.7 % (ref 11.5–15.5)
WBC: 5.5 10*3/uL (ref 4.0–10.5)

## 2022-05-21 LAB — COMPREHENSIVE METABOLIC PANEL
ALT: 14 U/L (ref 0–53)
AST: 15 U/L (ref 0–37)
Albumin: 4.4 g/dL (ref 3.5–5.2)
Alkaline Phosphatase: 61 U/L (ref 39–117)
BUN: 16 mg/dL (ref 6–23)
CO2: 27 mEq/L (ref 19–32)
Calcium: 9.2 mg/dL (ref 8.4–10.5)
Chloride: 100 mEq/L (ref 96–112)
Creatinine, Ser: 1.04 mg/dL (ref 0.40–1.50)
GFR: 71.79 mL/min (ref >60.00)
Glucose, Bld: 134 mg/dL — ABNORMAL HIGH (ref 70–99)
Potassium: 4.3 mEq/L (ref 3.5–5.1)
Sodium: 137 mEq/L (ref 135–145)
Total Bilirubin: 0.9 mg/dL (ref 0.2–1.2)
Total Protein: 6.7 g/dL (ref 6.0–8.3)

## 2022-05-21 LAB — PSA: PSA: 1.33 ng/mL (ref 0.10–4.00)

## 2022-05-21 LAB — LDL CHOLESTEROL, DIRECT: Direct LDL: 61 mg/dL

## 2022-05-21 LAB — VITAMIN B12: Vitamin B-12: 366 pg/mL (ref 211–911)

## 2022-05-28 ENCOUNTER — Ambulatory Visit (INDEPENDENT_AMBULATORY_CARE_PROVIDER_SITE_OTHER): Payer: Medicare PPO | Admitting: Family Medicine

## 2022-05-28 ENCOUNTER — Encounter: Payer: Self-pay | Admitting: Family Medicine

## 2022-05-28 ENCOUNTER — Ambulatory Visit (INDEPENDENT_AMBULATORY_CARE_PROVIDER_SITE_OTHER): Payer: Medicare PPO

## 2022-05-28 VITALS — BP 138/74 | HR 94 | Temp 97.2°F | Ht 71.0 in | Wt 173.8 lb

## 2022-05-28 VITALS — Ht 71.0 in | Wt 173.0 lb

## 2022-05-28 DIAGNOSIS — I10 Essential (primary) hypertension: Secondary | ICD-10-CM

## 2022-05-28 DIAGNOSIS — Z0000 Encounter for general adult medical examination without abnormal findings: Secondary | ICD-10-CM

## 2022-05-28 DIAGNOSIS — D649 Anemia, unspecified: Secondary | ICD-10-CM | POA: Diagnosis not present

## 2022-05-28 DIAGNOSIS — E785 Hyperlipidemia, unspecified: Secondary | ICD-10-CM | POA: Diagnosis not present

## 2022-05-28 DIAGNOSIS — Z7189 Other specified counseling: Secondary | ICD-10-CM | POA: Diagnosis not present

## 2022-05-28 DIAGNOSIS — N200 Calculus of kidney: Secondary | ICD-10-CM | POA: Diagnosis not present

## 2022-05-28 DIAGNOSIS — R011 Cardiac murmur, unspecified: Secondary | ICD-10-CM

## 2022-05-28 DIAGNOSIS — L409 Psoriasis, unspecified: Secondary | ICD-10-CM | POA: Diagnosis not present

## 2022-05-28 DIAGNOSIS — K5080 Crohn's disease of both small and large intestine without complications: Secondary | ICD-10-CM | POA: Diagnosis not present

## 2022-05-28 DIAGNOSIS — K9089 Other intestinal malabsorption: Secondary | ICD-10-CM

## 2022-05-28 DIAGNOSIS — E538 Deficiency of other specified B group vitamins: Secondary | ICD-10-CM

## 2022-05-28 MED ORDER — AMLODIPINE BESYLATE 5 MG PO TABS: 5.0000 mg | ORAL_TABLET | Freq: Every day | ORAL | 4 refills | Status: AC

## 2022-05-28 NOTE — Progress Notes (Signed)
Patient ID: Paul Atkinson, male    DOB: 1950-09-26, 71 y.o.   MRN: [id number]  This visit was conducted in person.  BP 138/74   Pulse 94   Temp (!) 97.2 F (36.2 C) (Temporal)   Ht 5' 11"  (1.803 m)   Wt 173 lb 12.8 oz (78.8 kg)   SpO2 96%   BMI 24.24 kg/m    CC: CPE Subjective:   HPI: Paul Atkinson is a 71 y.o. male presenting on 05/28/2022 for Annual Exam (MCR prt 2. )   Planned medicare wellness phone call with health advisor for this afternoon today at 3:15pm.   No results found.  [place] Office Visit from 05/24/2021 in [place] [facility] [place] [facility]  PHQ-2 Total Score 0          05/03/2020    3:25 PM 03/20/2019   10:34 AM 03/13/2018    8:14 AM 03/12/2017    1:24 PM 10/04/2015   10:32 AM  Fall Risk   Falls in the past year? 0 0 No No No  Number falls in past yr:  0     Risk for fall due to :  Medication side effect     Follow up  Falls evaluation completed;Falls prevention discussed      Recent labs were non-fasting. Missed BP meds for the past 4 days.   H/o crohn's ileocolitis s/p ilocecectomy remotely - receives b12 shots monthly. Persistently low levels despite this. Sees Dr Henrene Pastor yearly  Bile salt induced diarrhea - on colestipol.   Cataract extraction at [facility] earlier this year.   Has seen rheumatologist - referred by derm for further eval of chronic psoriasis. Tested negative for psoriatic arthritis. Intermittent lower back pain managed with minimal tylenol and advil.   Saw [facility] urology for R 35m distal ureteral stone s/p extraction and stent placement 04[facility]2021[facility]    Psoriasis predominantly to hands - uses ultraviolet B light box.   Notes some anterior knee pain over last few months. ?PFPS - will trial exercises provided today.   Preventative: Colonoscopy - 07/2016, stable. Patent anastomosis. Rec rpt 5 years (Dr. PHenrene Pastor COLONOSCOPY 01/27/2021 - superficial ulceration at ileocolonic anastomosis- benign biopsy, rpt 5  yrs (Henrene Pastor  Prostate cancer screening - minor sxs, attributed to enlarged prostate. Nocturia x1. Yearly PSA.  Lung cancer screening - not eligible  Flu shot yearly [place]/2021, 07/2019, 02/2020, 09/2020 Pneumovax 02/2017, prevnar-13 09/2015  Td 2012, Tdap 07/2020 zostavax 2012  RSV - discussed, to consider  Shingrix - 08/2019, 11/2019 Advanced directives: has at home. HCPOA is daughter. Will bring copy.  Seat belt use discussed.  Sunscreen use discussed. No changing moles on skin. Sees derm yearly.  Non smoker  Alcohol - none  Dentist Q6 mo  Eye exam - due. H/o iritis remotely - previously saw Dr BTobe Sos Bowel - no constipation - bile salt diarrhea managed with colestipol  Bladder - no incontinence    Occasional caffeine  Lives alone (widower 2019, lost wife to colon cancer), no pets  Occupation: retired, [employer] part-time handyman   Activity: no regular exercise but stays active  Diet: good water, fruits/vegetables some      Relevant past medical, surgical, family and social history reviewed and updated as indicated. Interim medical history since our last visit reviewed. Allergies and medications reviewed and updated. Outpatient Medications Prior to Visit  Medication Sig Dispense Refill   colestipol (COLESTID) 1 g tablet TAKE  4 TABLETS BY MOUTH EVERY DAY AS DIRECTED 360 tablet 3   cyanocobalamin (,VITAMIN B-12,) 1000 MCG/ML injection Inject 1,000 mcg into the muscle every 30 (thirty) days. Chronic therapy     docusate sodium (COLACE) 100 MG capsule Take 1 capsule (100 mg total) by mouth daily. (Patient taking differently: Take 100 mg by mouth daily as needed.) 10 capsule 0   fluticasone (FLONASE) 50 MCG/ACT nasal spray Place 1 spray into both nostrils daily.      hydrocortisone 2.5 % cream Apply topically 2 (two) times daily as needed (Rash). 28 g 5   ketoconazole (NIZORAL) 2 % shampoo apply three times per week, massage into scalp, behind ears and leave in for  10 minutes before rinsing out 120 mL 11   amLODipine (NORVASC) 5 MG tablet Take 1 tablet (5 mg total) by mouth daily. 90 tablet 3   No facility-administered medications prior to visit.     Per HPI unless specifically indicated in ROS section below Review of Systems  Constitutional:  Negative for activity change, appetite change, chills, fatigue, fever and unexpected weight change.  HENT:  Negative for hearing loss.   Eyes:  Negative for visual disturbance.  Respiratory:  Negative for cough, chest tightness, shortness of breath and wheezing.   Cardiovascular:  Negative for chest pain, palpitations and leg swelling.  Gastrointestinal:  Negative for abdominal distention, abdominal pain, blood in stool, constipation, diarrhea, nausea and vomiting.  Genitourinary:  Negative for difficulty urinating and hematuria.  Musculoskeletal:  Negative for arthralgias, myalgias and neck pain.  Skin:  Negative for rash.  Neurological:  Negative for dizziness, seizures, syncope and headaches.  Hematological:  Negative for adenopathy. Does not bruise/bleed easily.  Psychiatric/Behavioral:  Negative for dysphoric mood. The patient is not nervous/anxious.     Objective:  BP 138/74   Pulse 94   Temp (!) 97.2 F (36.2 C) (Temporal)   Ht 5' 11"  (1.803 m)   Wt 173 lb 12.8 oz (78.8 kg)   SpO2 96%   BMI 24.24 kg/m   Wt Readings from Last 3 Encounters:  05/28/22 173 lb 12.8 oz (78.8 kg)  03/30/22 177 lb (80.3 kg)  05/24/21 175 lb 6 oz (79.5 kg)      Physical Exam Vitals and nursing note reviewed.  Constitutional:      General: He is not in acute distress.    Appearance: Normal appearance. He is well-developed. He is not ill-appearing.  HENT:     Head: Normocephalic and atraumatic.     Right Ear: Hearing, tympanic membrane, ear canal and external ear normal.     Left Ear: Hearing, tympanic membrane, ear canal and external ear normal.     Ears:     Comments: Wearing hearing aides    Nose: Nose  normal.     Mouth/Throat:     Mouth: Mucous membranes are moist.     Pharynx: Oropharynx is clear. No oropharyngeal exudate or posterior oropharyngeal erythema.  Eyes:     General: No scleral icterus.    Extraocular Movements: Extraocular movements intact.     Conjunctiva/sclera: Conjunctivae normal.     Pupils: Pupils are equal, round, and reactive to light.  Neck:     Thyroid: No thyroid mass or thyromegaly.     Vascular: No carotid bruit.  Cardiovascular:     Rate and Rhythm: Normal rate and regular rhythm.     Pulses: Normal pulses.          Radial pulses are 2+ on  the right side and 2+ on the left side.     Heart sounds: Murmur (2/6 systolic RUSB) heard.  Pulmonary:     Effort: Pulmonary effort is normal. No respiratory distress.     Breath sounds: Normal breath sounds. No wheezing, rhonchi or rales.  Abdominal:     General: Bowel sounds are normal. There is no distension.     Palpations: Abdomen is soft. There is no mass.     Tenderness: There is no abdominal tenderness. There is no guarding or rebound.     Hernia: No hernia is present.  Musculoskeletal:        General: Normal range of motion.     Cervical back: Normal range of motion and neck supple.     Right lower leg: No edema.     Left lower leg: No edema.  Lymphadenopathy:     Cervical: No cervical adenopathy.  Skin:    General: Skin is warm and dry.     Findings: No rash.  Neurological:     General: No focal deficit present.     Mental Status: He is alert and oriented to person, place, and time.  Psychiatric:        Mood and Affect: Mood normal.        Behavior: Behavior normal.        Thought Content: Thought content normal.        Judgment: Judgment normal.       Results for orders placed or performed in visit on 05/21/22  Microalbumin / creatinine urine ratio  Result Value Ref Range   Microalb, Ur <0.7 0.0 - 1.9 mg/dL   Creatinine,U 112.5 mg/dL   Microalb Creat Ratio 0.6 0.0 - 30.0 mg/g  PSA   Result Value Ref Range   PSA 1.33 0.10 - 4.00 ng/mL  CBC with Differential/Platelet  Result Value Ref Range   WBC 5.5 4.0 - 10.5 K/uL   RBC 4.51 4.22 - 5.81 Mil/uL   Hemoglobin 14.8 13.0 - 17.0 g/dL   HCT 42.4 39.0 - 52.0 %   MCV 94.0 78.0 - 100.0 fl   MCHC 35.0 30.0 - 36.0 g/dL   RDW 12.7 11.5 - 15.5 %   Platelets 265.0 150.0 - 400.0 K/uL   Neutrophils Relative % 70.5 43.0 - 77.0 %   Lymphocytes Relative 15.8 12.0 - 46.0 %   Monocytes Relative 9.0 3.0 - 12.0 %   Eosinophils Relative 4.1 0.0 - 5.0 %   Basophils Relative 0.6 0.0 - 3.0 %   Neutro Abs 3.9 1.4 - 7.7 K/uL   Lymphs Abs 0.9 0.7 - 4.0 K/uL   Monocytes Absolute 0.5 0.1 - 1.0 K/uL   Eosinophils Absolute 0.2 0.0 - 0.7 K/uL   Basophils Absolute 0.0 0.0 - 0.1 K/uL  Comprehensive metabolic panel  Result Value Ref Range   Sodium 137 135 - 145 mEq/L   Potassium 4.3 3.5 - 5.1 mEq/L   Chloride 100 96 - 112 mEq/L   CO2 27 19 - 32 mEq/L   Glucose, Bld 134 (H) 70 - 99 mg/dL   BUN 16 6 - 23 mg/dL   Creatinine, Ser 1.04 0.40 - 1.50 mg/dL   Total Bilirubin 0.9 0.2 - 1.2 mg/dL   Alkaline Phosphatase 61 39 - 117 U/L   AST 15 0 - 37 U/L   ALT 14 0 - 53 U/L   Total Protein 6.7 6.0 - 8.3 g/dL   Albumin 4.4 3.5 - 5.2 g/dL   GFR 71.79 >  60.00 mL/min   Calcium 9.2 8.4 - 10.5 mg/dL  Lipid panel  Result Value Ref Range   Cholesterol 123 0 - 200 mg/dL   Triglycerides 242.0 (H) 0.0 - 149.0 mg/dL   HDL 41.20 >39.00 mg/dL   VLDL 48.4 (H) 0.0 - 40.0 mg/dL   Total CHOL/HDL Ratio 3    NonHDL 82.26   Vitamin B12  Result Value Ref Range   Vitamin B-12 366 211 - 911 pg/mL  LDL cholesterol, direct  Result Value Ref Range   Direct LDL 61.0 mg/dL    Assessment & Plan:   Problem List Items Addressed This Visit       Unprioritized   Healthcare maintenance - Primary (Chronic)    Preventative protocols reviewed and updated unless pt declined. Discussed healthy diet and lifestyle.       Advanced care planning/counseling discussion  (Chronic)    Advanced directives: has at home. HCPOA is daughter. Will bring copy.       Dyslipidemia    Chronic, stable period on colestipol. Recent FLP non-fasting.  The ASCVD Risk score (Arnett DK, et al., 2019) failed to calculate for the following reasons:   The valid total cholesterol range is 130 to 320 mg/dL       Anemia    Levels normal, he's not been donating blood as frequently.       HYPERTENSION, BENIGN ESSENTIAL    Chronic, stable on amlodipine - continue current regimen.       Relevant Medications   amLODipine (NORVASC) 5 MG tablet   Crohn's ileocolitis s/p remote ileostomy    Stable period followed by GI.       Bile salt-induced diarrhea    Continue colestipol through GI.       Psoriasis    Regularly sees dermatology.       Vitamin B12 deficiency    Continues monthly B12 shots. Recent levels normal.       Nephrolithiasis    Now sees [facility] urology       Systolic murmur    Mild, ?aortic slcerosis. Will monitor.         Meds ordered this encounter  Medications   amLODipine (NORVASC) 5 MG tablet    Sig: Take 1 tablet (5 mg total) by mouth daily.    Dispense:  90 tablet    Refill:  4   No orders of the defined types were placed in this encounter.   Patient instructions: Look into RSV vaccine at local pharmacy Bring [place] a copy of your living will to update your chart.  Return in 1 year for next physical/wellness visit.   Follow up plan: Return in about 1 year (around 05/29/2023) for annual exam, prior fasting for blood work, medicare wellness visit.  Ria Bush, MD

## 2022-05-28 NOTE — Assessment & Plan Note (Signed)
Continues monthly B12 shots. Recent levels normal.

## 2022-05-28 NOTE — Assessment & Plan Note (Signed)
Stable period followed by GI.

## 2022-05-28 NOTE — Assessment & Plan Note (Signed)
Regularly sees dermatology.

## 2022-05-28 NOTE — Assessment & Plan Note (Signed)
Chronic, stable period on colestipol. Recent FLP non-fasting.  The ASCVD Risk score (Arnett DK, et al., 2019) failed to calculate for the following reasons:   The valid total cholesterol range is 130 to 320 mg/dL

## 2022-05-28 NOTE — Assessment & Plan Note (Signed)
Chronic, stable on amlodipine - continue current regimen.

## 2022-05-28 NOTE — Assessment & Plan Note (Signed)
Advanced directives: has at home. HCPOA is daughter. Will bring copy.

## 2022-05-28 NOTE — Assessment & Plan Note (Signed)
Levels normal, he's not been donating blood as frequently.

## 2022-05-28 NOTE — Assessment & Plan Note (Signed)
Mild, ?aortic slcerosis. Will monitor.

## 2022-05-28 NOTE — Assessment & Plan Note (Signed)
Preventative protocols reviewed and updated unless pt declined. Discussed healthy diet and lifestyle.  

## 2022-05-28 NOTE — Patient Instructions (Addendum)
Look into RSV vaccine at local pharmacy Bring [place] a copy of your living will to update your chart.  Return in 1 year for next physical/wellness visit.   Health Maintenance After Age 72 After age 73, you are at a higher risk for certain long-term diseases and infections as well as injuries from falls. Falls are a major cause of broken bones and head injuries in people who are older than age 18. Getting regular preventive care can help to keep you healthy and well. Preventive care includes getting regular testing and making lifestyle changes as recommended by your health care provider. Talk with your health care provider about: Which screenings and tests you should have. A screening is a test that checks for a disease when you have no symptoms. A diet and exercise plan that is right for you. What should I know about screenings and tests to prevent falls? Screening and testing are the best ways to find a health problem early. Early diagnosis and treatment give you the best chance of managing medical conditions that are common after age 61. Certain conditions and lifestyle choices may make you more likely to have a fall. Your health care provider may recommend: Regular vision checks. Poor vision and conditions such as cataracts can make you more likely to have a fall. If you wear glasses, make sure to get your prescription updated if your vision changes. Medicine review. Work with your health care provider to regularly review all of the medicines you are taking, including over-the-counter medicines. Ask your health care provider about any side effects that may make you more likely to have a fall. Tell your health care provider if any medicines that you take make you feel dizzy or sleepy. Strength and balance checks. Your health care provider may recommend certain tests to check your strength and balance while standing, walking, or changing positions. Foot health exam. Foot pain and numbness, as well as not  wearing proper footwear, can make you more likely to have a fall. Screenings, including: Osteoporosis screening. Osteoporosis is a condition that causes the bones to get weaker and break more easily. Blood pressure screening. Blood pressure changes and medicines to control blood pressure can make you feel dizzy. Depression screening. You may be more likely to have a fall if you have a fear of falling, feel depressed, or feel unable to do activities that you used to do. Alcohol use screening. Using too much alcohol can affect your balance and may make you more likely to have a fall. Follow these instructions at home: Lifestyle Do not drink alcohol if: Your health care provider tells you not to drink. If you drink alcohol: Limit how much you have to: 0-1 drink a day for women. 0-2 drinks a day for men. Know how much alcohol is in your drink. In the [place] one drink equals one 12 oz bottle of beer (355 mL), one 5 oz glass of wine (148 mL), or one 1 oz glass of hard liquor (44 mL). Do not use any products that contain nicotine or tobacco. These products include cigarettes, chewing tobacco, and vaping devices, such as e-cigarettes. If you need help quitting, ask your health care provider. Activity  Follow a regular exercise program to stay fit. This will help you maintain your balance. Ask your health care provider what types of exercise are appropriate for you. If you need a cane or walker, use it as recommended by your health care provider. Wear supportive shoes that have nonskid soles.  Safety  Remove any tripping hazards, such as rugs, cords, and clutter. Install safety equipment such as grab bars in bathrooms and safety rails on stairs. Keep rooms and walkways well-lit. General instructions Talk with your health care provider about your risks for falling. Tell your health care provider if: You fall. Be sure to tell your health care provider about all falls, even ones that seem minor. You  feel dizzy, tiredness (fatigue), or off-balance. Take over-the-counter and prescription medicines only as told by your health care provider. These include supplements. Eat a healthy diet and maintain a healthy weight. A healthy diet includes low-fat dairy products, low-fat (lean) meats, and fiber from whole grains, beans, and lots of fruits and vegetables. Stay current with your vaccines. Schedule regular health, dental, and eye exams. Summary Having a healthy lifestyle and getting preventive care can help to protect your health and wellness after age 17. Screening and testing are the best way to find a health problem early and help you avoid having a fall. Early diagnosis and treatment give you the best chance for managing medical conditions that are more common for people who are older than age 24. Falls are a major cause of broken bones and head injuries in people who are older than age 40. Take precautions to prevent a fall at home. Work with your health care provider to learn what changes you can make to improve your health and wellness and to prevent falls. This information is not intended to replace advice given to you by your health care provider. Make sure you discuss any questions you have with your health care provider. Document Revised: 10/31/2020 Document Reviewed: 10/31/2020 Elsevier Patient Education  [place].

## 2022-05-28 NOTE — Patient Instructions (Addendum)
Paul Atkinson , Thank you for taking time to come for your Medicare Wellness Visit. I appreciate your ongoing commitment to your health goals. Please review the following plan we discussed and let me know if I can assist you in the future.   These are the goals we discussed:  Goals       Eat more fruits and vegetables      Starting 03/13/2018, I will continue to eat at least 4-5 servings of vegetables daily.       No current goals (pt-stated)      Patient Stated      03/20/2019, Patient will try to exercise more daily         This is a list of the screening recommended for you and due dates:  Health Maintenance  Topic Date Due   COVID-19 Vaccine (5 - 2023-24 season) 06/13/2022*   Medicare Annual Wellness Visit  05/29/2023   Colon Cancer Screening  01/27/2026   DTaP/Tdap/Td vaccine (4 - Td or Tdap) 08/19/2030   Pneumonia Vaccine  Completed   Flu Shot  Completed   Hepatitis C Screening: USPSTF Recommendation to screen - Ages 18-79 yo.  Completed   Zoster (Shingles) Vaccine  Completed   HPV Vaccine  Aged Out  *Topic was postponed. The date shown is not the original due date.    Advanced directives: Please bring a copy of your health care power of attorney and living will to the office to be added to your chart at your convenience.   Conditions/risks identified: None  Next appointment: Follow up in one year for your annual wellness visit.   Preventive Care 61 Years and Older, Male  Preventive care refers to lifestyle choices and visits with your health care provider that can promote health and wellness. What does preventive care include? A yearly physical exam. This is also called an annual well check. Dental exams once or twice a year. Routine eye exams. Ask your health care provider how often you should have your eyes checked. Personal lifestyle choices, including: Daily care of your teeth and gums. Regular physical activity. Eating a healthy diet. Avoiding tobacco and drug  use. Limiting alcohol use. Practicing safe sex. Taking low doses of aspirin every day. Taking vitamin and mineral supplements as recommended by your health care provider. What happens during an annual well check? The services and screenings done by your health care provider during your annual well check will depend on your age, overall health, lifestyle risk factors, and family history of disease. Counseling  Your health care provider may ask you questions about your: Alcohol use. Tobacco use. Drug use. Emotional well-being. Home and relationship well-being. Sexual activity. Eating habits. History of falls. Memory and ability to understand (cognition). Work and work Statistician. Screening  You may have the following tests or measurements: Height, weight, and BMI. Blood pressure. Lipid and cholesterol levels. These may be checked every 5 years, or more frequently if you are over 27 years old. Skin check. Lung cancer screening. You may have this screening every year starting at age 50 if you have a 30-pack-year history of smoking and currently smoke or have quit within the past 15 years. Fecal occult blood test (FOBT) of the stool. You may have this test every year starting at age 33. Flexible sigmoidoscopy or colonoscopy. You may have a sigmoidoscopy every 5 years or a colonoscopy every 10 years starting at age 49. Prostate cancer screening. Recommendations will vary depending on your family history and other  risks. Hepatitis C blood test. Hepatitis B blood test. Sexually transmitted disease (STD) testing. Diabetes screening. This is done by checking your blood sugar (glucose) after you have not eaten for a while (fasting). You may have this done every 1-3 years. Abdominal aortic aneurysm (AAA) screening. You may need this if you are a current or former smoker. Osteoporosis. You may be screened starting at age 37 if you are at high risk. Talk with your health care provider about  your test results, treatment options, and if necessary, the need for more tests. Vaccines  Your health care provider may recommend certain vaccines, such as: Influenza vaccine. This is recommended every year. Tetanus, diphtheria, and acellular pertussis (Tdap, Td) vaccine. You may need a Td booster every 10 years. Zoster vaccine. You may need this after age 78. Pneumococcal 13-valent conjugate (PCV13) vaccine. One dose is recommended after age 32. Pneumococcal polysaccharide (PPSV23) vaccine. One dose is recommended after age 69. Talk to your health care provider about which screenings and vaccines you need and how often you need them. This information is not intended to replace advice given to you by your health care provider. Make sure you discuss any questions you have with your health care provider. Document Released: 07/08/2015 Document Revised: 02/29/2016 Document Reviewed: 04/12/2015 Elsevier Interactive Patient Education  2017 [place] Prevention in the Home Falls can cause injuries. They can happen to people of all ages. There are many things you can do to make your home safe and to help prevent falls. What can I do on the outside of my home? Regularly fix the edges of walkways and driveways and fix any cracks. Remove anything that might make you trip as you walk through a door, such as a raised step or threshold. Trim any bushes or trees on the path to your home. Use bright outdoor lighting. Clear any walking paths of anything that might make someone trip, such as rocks or tools. Regularly check to see if handrails are loose or broken. Make sure that both sides of any steps have handrails. Any raised decks and porches should have guardrails on the edges. Have any leaves, snow, or ice cleared regularly. Use sand or salt on walking paths during winter. Clean up any spills in your garage right away. This includes oil or grease spills. What can I do in the bathroom? Use  night lights. Install grab bars by the toilet and in the tub and shower. Do not use towel bars as grab bars. Use non-skid mats or decals in the tub or shower. If you need to sit down in the shower, use a plastic, non-slip stool. Keep the floor dry. Clean up any water that spills on the floor as soon as it happens. Remove soap buildup in the tub or shower regularly. Attach bath mats securely with double-sided non-slip rug tape. Do not have throw rugs and other things on the floor that can make you trip. What can I do in the bedroom? Use night lights. Make sure that you have a light by your bed that is easy to reach. Do not use any sheets or blankets that are too big for your bed. They should not hang down onto the floor. Have a firm chair that has side arms. You can use this for support while you get dressed. Do not have throw rugs and other things on the floor that can make you trip. What can I do in the kitchen? Clean up any spills right away.  Avoid walking on wet floors. Keep items that you use a lot in easy-to-reach places. If you need to reach something above you, use a strong step stool that has a grab bar. Keep electrical cords out of the way. Do not use floor polish or wax that makes floors slippery. If you must use wax, use non-skid floor wax. Do not have throw rugs and other things on the floor that can make you trip. What can I do with my stairs? Do not leave any items on the stairs. Make sure that there are handrails on both sides of the stairs and use them. Fix handrails that are broken or loose. Make sure that handrails are as long as the stairways. Check any carpeting to make sure that it is firmly attached to the stairs. Fix any carpet that is loose or worn. Avoid having throw rugs at the top or bottom of the stairs. If you do have throw rugs, attach them to the floor with carpet tape. Make sure that you have a light switch at the top of the stairs and the bottom of the  stairs. If you do not have them, ask someone to add them for you. What else can I do to help prevent falls? Wear shoes that: Do not have high heels. Have rubber bottoms. Are comfortable and fit you well. Are closed at the toe. Do not wear sandals. If you use a stepladder: Make sure that it is fully opened. Do not climb a closed stepladder. Make sure that both sides of the stepladder are locked into place. Ask someone to hold it for you, if possible. Clearly mark and make sure that you can see: Any grab bars or handrails. First and last steps. Where the edge of each step is. Use tools that help you move around (mobility aids) if they are needed. These include: Canes. Walkers. Scooters. Crutches. Turn on the lights when you go into a dark area. Replace any light bulbs as soon as they burn out. Set up your furniture so you have a clear path. Avoid moving your furniture around. If any of your floors are uneven, fix them. If there are any pets around you, be aware of where they are. Review your medicines with your doctor. Some medicines can make you feel dizzy. This can increase your chance of falling. Ask your doctor what other things that you can do to help prevent falls. This information is not intended to replace advice given to you by your health care provider. Make sure you discuss any questions you have with your health care provider. Document Released: 04/07/2009 Document Revised: 11/17/2015 Document Reviewed: 07/16/2014 Elsevier Interactive Patient Education  2017 [employer].

## 2022-05-28 NOTE — Progress Notes (Signed)
Subjective:   Paul Atkinson is a 71 y.o. male who presents for Medicare Annual/Subsequent preventive examination.  Review of Systems    Virtual Visit via Telephone Note  I connected with  Paul Atkinson on 05/28/22 at  3:15 PM EST by telephone and verified that I am speaking with the correct person using two identifiers.  Location: Patient: Home Provider: Office Persons participating in the virtual visit: patient/Nurse Health Advisor   I discussed the limitations, risks, security and privacy concerns of performing an evaluation and management service by telephone and the availability of in person appointments. The patient expressed understanding and agreed to proceed.  Interactive audio and video telecommunications were attempted between this nurse and patient, however failed, due to patient having technical difficulties OR patient did not have access to video capability.  We continued and completed visit with audio only.  Some vital signs may be absent or patient reported.   Paul Peaches, LPN  Cardiac Risk Factors include: advanced age (>33mn, >>25women);hypertension;male gender     Objective:    Today's Vitals   05/28/22 1529  Weight: 173 lb (78.5 kg)  Height: 5' 11"  (1.803 m)   Body mass index is 24.13 kg/m.     05/28/2022    3:35 PM 03/20/2019   10:31 AM 03/13/2018    8:15 AM 03/12/2017    1:24 PM  Advanced Directives  Does Patient Have a Medical Advance Directive? Yes Yes Yes Yes  Type of AParamedicof [place]Living will [place]unxsut[place]Living will [place] will [place]Living will  Does patient want to make changes to medical advance directive?    Yes (MAU/Ambulatory/Procedural Areas - Information given)  Copy of H[place]andsin Chart? No - copy requested No - copy requested No - copy requested No - copy requested    Current Medications  (verified) Outpatient Encounter Medications as of 05/28/2022  Medication Sig   amLODipine (NORVASC) 5 MG tablet Take 1 tablet (5 mg total) by mouth daily.   colestipol (COLESTID) 1 g tablet TAKE 4 TABLETS BY MOUTH EVERY DAY AS DIRECTED   cyanocobalamin (,VITAMIN B-12,) 1000 MCG/ML injection Inject 1,000 mcg into the muscle every 30 (thirty) days. Chronic therapy   docusate sodium (COLACE) 100 MG capsule Take 1 capsule (100 mg total) by mouth daily. (Patient taking differently: Take 100 mg by mouth daily as needed.)   fluticasone (FLONASE) 50 MCG/ACT nasal spray Place 1 spray into both nostrils daily.    hydrocortisone 2.5 % cream Apply topically 2 (two) times daily as needed (Rash).   ketoconazole (NIZORAL) 2 % shampoo apply three times per week, massage into scalp, behind ears and leave in for 10 minutes before rinsing out   No facility-administered encounter medications on file as of 05/28/2022.    Allergies (verified) Penicillins   History: Past Medical History:  Diagnosis Date   Allergy    mild   Anemia    iron def   Basal cell carcinoma    back, chest    Bile salt-induced diarrhea    Crohn disease ([place]    status post ileocecectomy 1989   Disorder of inner ear    Hearing loss    Hx of duodenal ulcer    Hx SBO    Hypertension    Kidney stones    Malabsorption    Psoriasis    Ulcer    Vitamin B 12 deficiency  secondary to ileal resection   Past Surgical History:  Procedure Laterality Date   COLONOSCOPY  07/2016   patent anastomosis, no polyps, rpt 5 yrs Henrene Pastor)   COLONOSCOPY  01/27/2021   superficial ulceration at ileocolonic anastomosis- benign biopsy, rpt 5 yrs Henrene Pastor)   [place]   peritonitis with SBO from crohn's   KIDNEY STONE SURGERY     removal   MOLE REMOVAL     back   MOUTH SURGERY     SEPTOPLASTY  05/2004   due to deviated septum (Dr. Myna Hidalgo)   Family History  Problem Relation Age of Onset   Heart disease Mother        CHF,  arrhythmias   Kidney disease Mother        kidney failure   Hypertension Mother    Kidney disease Father        renal failure   CAD Father 77       MI stents   Diabetes Father 38       on insulin   Hyperlipidemia Father    Rectal cancer Sister    Cancer Neg Hx    Colon cancer Neg Hx    Pancreatic cancer Neg Hx    Esophageal cancer Neg Hx    Colon polyps Neg Hx    Stomach cancer Neg Hx    Social History   Socioeconomic History   Marital status: Widowed    Spouse name: Not on file   Number of children: 2   Years of education: Not on file   Highest education level: Not on file  Occupational History   Occupation: AGENT-retired    Employer: [employer]    Comment: Insurance  Tobacco Use   Smoking status: Never   Smokeless tobacco: Never  Vaping Use   Vaping Use: Never used  Substance and Sexual Activity   Alcohol use: No    Alcohol/week: 0.0 standard drinks of alcohol    Comment: very rarely   Drug use: No   Sexual activity: Yes  Other Topics Concern   Not on file  Social History Narrative   occasional caffeine   Widower - wife Paul Atkinson passed away from metastatic cancer 2019    Occupation: retired, Medical illustrator   Activity: no regular exercise but stays active   Diet: good water, fruits/vegetables some   Social Determinants of Health   Financial Resource Strain: Low Risk  (05/28/2022)   Overall Financial Resource Strain (CARDIA)    Difficulty of Paying Living Expenses: Not hard at all  Food Insecurity: No Food Insecurity (05/28/2022)   Hunger Vital Sign    Worried About Running Out of Food in the Last Year: Never true    [place] in the Last Year: Never true  Transportation Needs: No Transportation Needs (05/28/2022)   PRAPARE - Hydrologist (Medical): No    Lack of Transportation (Non-Medical): No  Physical Activity: Inactive (05/28/2022)   Exercise Vital Sign    Days of Exercise per Week: 0 days    Minutes of  Exercise per Session: 0 min  Stress: No Stress Concern Present (05/28/2022)   [place]    Feeling of Stress : Not at all  Social Connections: Moderately Integrated (05/28/2022)   Social Connection and Isolation Panel [NHANES]    Frequency of Communication with Friends and Family: More than three times a week    Frequency of Social  Gatherings with Friends and Family: More than three times a week    Attends Religious Services: More than 4 times per year    Active Member of Clubs or Organizations: Yes    Attends Archivist Meetings: More than 4 times per year    Marital Status: Widowed    Tobacco Counseling Counseling given: Not Answered   Clinical Intake:  Pre-visit preparation completed: No  Pain : No/denies pain     BMI - recorded: 24.13 Nutritional Status: BMI of 19-24  Normal Nutritional Risks: None Diabetes: No  How often do you need to have someone help you when you read instructions, pamphlets, or other written materials from your doctor or pharmacy?: 1 - Never  Diabetic?  No  Interpreter Needed?: No  Information entered by :: Rolene Arbour LPN   Activities of Daily Living    05/28/2022    3:33 PM  In your present state of health, do you have any difficulty performing the following activities:  Hearing? 1  Comment Wears hearing aids  Vision? 0  Difficulty concentrating or making decisions? 0  Walking or climbing stairs? 0  Dressing or bathing? 0  Doing errands, shopping? 0  Preparing Food and eating ? N  Using the Toilet? N  In the past six months, have you accidently leaked urine? N  Do you have problems with loss of bowel control? N  Managing your Medications? N  Managing your Finances? N  Housekeeping or managing your Housekeeping? N    Patient Care Team: Ria Bush, MD as PCP - General (Family Medicine)  Indicate any recent Medical Services you may have received  from other than [facility] providers in the past year (date may be approximate).     Assessment:   This is a routine wellness examination for [facility].  Hearing/Vision screen Hearing Screening - Comments:: Wears hearing aids Vision Screening - Comments::  up to date with routine eye exams with  [place] issues and exercise activities discussed: Current Exercise Habits: The patient does not participate in regular exercise at present, Exercise limited by: None identified   Goals Addressed               This Visit's Progress     No current goals (pt-stated)         Depression Screen    05/28/2022    3:32 PM 05/24/2021    3:40 PM 05/03/2020    3:52 PM 03/20/2019   10:34 AM 03/13/2018    8:14 AM 03/12/2017    1:24 PM 12/12/2016    9:26 AM  PHQ 2/9 Scores  PHQ - 2 Score 0 0 0 0 0 0 2  PHQ- 9 Score  0 0 0 0 1 2    Fall Risk    05/28/2022    3:34 PM 05/03/2020    3:25 PM 03/20/2019   10:34 AM 03/13/2018    8:14 AM 03/12/2017    1:24 PM  [place] in the past year? 0 0 0 No No  Number falls in past yr: 0  0    Injury with Fall? 0      Risk for fall due to : No Fall Risks  Medication side effect    Follow up Falls prevention discussed  Falls evaluation completed;Falls prevention discussed      FALL RISK PREVENTION PERTAINING TO THE HOME:  Any stairs in or around the home? Yes No  If so, are there  any without handrails?  Home free of loose throw rugs in walkways, pet beds, electrical cords, etc? Yes  Adequate lighting in your home to reduce risk of falls? Yes   ASSISTIVE DEVICES UTILIZED TO PREVENT FALLS:  Life alert? No  Use of a cane, walker or w/c? No  Grab bars in the bathroom? No  Shower chair or bench in shower? No  Elevated toilet seat or a handicapped toilet? No   TIMED UP AND GO:  Was the test performed? No . Audio Visit   Cognitive Function:    03/20/2019   10:38 AM 03/13/2018    8:15 AM 03/12/2017    1:30 PM  MMSE - Mini Mental State  Exam  Orientation to time 5 5 5   Orientation to Place 5 5 5   Registration 3 3 3   Attention/ Calculation 5 0 0  Recall 3 3 3   Language- name 2 objects  0 0  Language- repeat 1 1 1   Language- follow 3 step command  3 3  Language- read & follow direction  0 0  Write a sentence  0 0  Copy design  0 0  Total score  20 20        05/28/2022    3:35 PM  6CIT Screen  What Year? 0 points  What month? 0 points  What time? 0 points  Count back from 20 0 points  Months in reverse 0 points  Repeat phrase 0 points  Total Score 0 points    Immunizations Immunization History  Administered Date(s) Administered   Fluad Quad(high Dose 65+) 03/11/2019, 03/08/2020, 03/20/2022   H1N1 07/01/2008   Influenza Split 03/29/2011, 04/25/2012   Influenza Whole 03/26/2006, 03/28/2007, 03/10/2008, 05/26/2009, 03/23/2010   Influenza, High Dose Seasonal PF 04/20/2021   Influenza, Seasonal, Injecte, Preservative Fre 04/26/2014   Influenza,inj,Quad PF,6+ Mos 04/27/2015, 04/24/2016, 03/12/2017, 03/13/2018   Influenza-Unspecified 03/25/2014   PFIZER(Purple Top)SARS-COV-2 Vaccination 07/15/2019, 08/05/2019, 03/22/2020, 10/14/2020   Pneumococcal Conjugate-13 10/04/2015   Pneumococcal Polysaccharide-23 03/12/2017   Td 11/07/2005, 08/30/2010   Tdap 08/19/2020   Zoster Recombinat (Shingrix) 09/09/2019, 11/30/2019   Zoster, Live 08/30/2010    TDAP status: Up to date  Flu Vaccine status: Up to date  Pneumococcal vaccine status: Up to date  Covid-19 vaccine status: Completed vaccines  Qualifies for Shingles Vaccine? Yes   Zostavax completed Yes   Shingrix Completed?: Yes  Screening Tests Health Maintenance  Topic Date Due   COVID-19 Vaccine (5 - 2023-24 season) 06/13/2022 (Originally 02/23/2022)   Medicare Annual Wellness (AWV)  05/29/2023   COLONOSCOPY (Pts 45-36yr Insurance coverage will need to be confirmed)  01/27/2026   DTaP/Tdap/Td (4 - Td or Tdap) 08/19/2030   Pneumonia Vaccine 72 Years old   Completed   INFLUENZA VACCINE  Completed   Hepatitis C Screening  Completed   Zoster Vaccines- Shingrix  Completed   HPV VACCINES  Aged Out    Health Maintenance  There are no preventive care reminders to display for this patient.   Colorectal cancer screening: Type of screening: Colonoscopy. Completed 01/27/21. Repeat every 5 years  Lung Cancer Screening: (Low Dose CT Chest recommended if Age 72-80years, 30 pack-year currently smoking OR have quit w/in 15years.) does not qualify.     Additional Screening:  Hepatitis C Screening: does qualify; Completed 09/27/15  Vision Screening: Recommended annual ophthalmology exams for early detection of glaucoma and other disorders of the eye. Is the patient up to date with their annual eye exam?  Yes  Who  is the provider or what is the name of the office in which the patient attends annual eye exams? [place] If pt is not established with a provider, would they like to be referred to a provider to establish care? No .   Dental Screening: Recommended annual dental exams for proper oral hygiene  Community Resource Referral / Chronic Care Management: CRR required this visit?  No   CCM required this visit?  No      Plan:     I have personally reviewed and noted the following in the patient's chart:   Medical and social history Use of alcohol, tobacco or illicit drugs  Current medications and supplements including opioid prescriptions. Patient is not currently taking opioid prescriptions. Functional ability and status Nutritional status Physical activity Advanced directives List of other physicians Hospitalizations, surgeries, and ER visits in previous 12 months Vitals Screenings to include cognitive, depression, and falls Referrals and appointments  In addition, I have reviewed and discussed with patient certain preventive protocols, quality metrics, and best practice recommendations. A written personalized care plan for  preventive services as well as general preventive health recommendations were provided to patient.     Paul Peaches, LPN   05/26/4496   Nurse Notes: None

## 2022-05-28 NOTE — Assessment & Plan Note (Signed)
Now sees [facility] urology

## 2022-05-28 NOTE — Assessment & Plan Note (Signed)
Continue colestipol through GI.

## 2022-06-08 DIAGNOSIS — N200 Calculus of kidney: Secondary | ICD-10-CM | POA: Diagnosis not present

## 2022-06-08 DIAGNOSIS — K760 Fatty (change of) liver, not elsewhere classified: Secondary | ICD-10-CM | POA: Diagnosis not present

## 2022-06-08 DIAGNOSIS — N401 Enlarged prostate with lower urinary tract symptoms: Secondary | ICD-10-CM | POA: Diagnosis not present

## 2022-06-12 ENCOUNTER — Ambulatory Visit: Payer: Medicare PPO

## 2022-06-21 ENCOUNTER — Ambulatory Visit (INDEPENDENT_AMBULATORY_CARE_PROVIDER_SITE_OTHER): Payer: Medicare PPO

## 2022-06-21 DIAGNOSIS — E538 Deficiency of other specified B group vitamins: Secondary | ICD-10-CM

## 2022-06-21 MED ORDER — CYANOCOBALAMIN 1000 MCG/ML IJ SOLN
1000.0000 ug | Freq: Once | INTRAMUSCULAR | Status: AC
  Administered 2022-06-21: 1000 ug via INTRAMUSCULAR

## 2022-06-21 NOTE — Progress Notes (Signed)
Per orders of Dr. Glori Bickers, injection of vit Z00 given by Brenton Grills. Patient tolerated injection well.

## 2022-07-17 DIAGNOSIS — H1089 Other conjunctivitis: Secondary | ICD-10-CM | POA: Diagnosis not present

## 2022-07-31 ENCOUNTER — Ambulatory Visit: Payer: Medicare PPO | Admitting: Family Medicine

## 2022-07-31 ENCOUNTER — Encounter: Payer: Self-pay | Admitting: Family Medicine

## 2022-07-31 VITALS — BP 132/78 | HR 72 | Temp 97.3°F | Ht 71.0 in | Wt 177.2 lb

## 2022-07-31 DIAGNOSIS — R519 Headache, unspecified: Secondary | ICD-10-CM | POA: Diagnosis not present

## 2022-07-31 NOTE — Assessment & Plan Note (Addendum)
Acute left temporoparietal headache while receiving treatment for bacterial conjunctivitis through eye doctor with neomycin - polymyxin B - dextromethasone. Overall reassuring exam today, symptoms are improved as well. ?referred pain. Not consistent with trigeminal neuralgia, TMJ dysfunction. ?rashless shingles after shingrix vaccination - outside of window for antiviral and symptoms are largely resolved - will continue to monitor, update if recurrent or ongoing.

## 2022-07-31 NOTE — Progress Notes (Signed)
Patient ID: Paul Atkinson, male    DOB: [birth year], 72 y.o.   MRN: [id number]  This visit was conducted in person.  BP 132/78   Pulse 72   Temp (!) 97.3 F (36.3 C) (Temporal)   Ht '5\' 11"'$  (1.803 m)   Wt 177 lb 4 oz (80.4 kg)   SpO2 96%   BMI 24.72 kg/m    CC: headache, left facial pain  Subjective:   HPI: Paul Atkinson is a 72 y.o. male presenting on 07/31/2022 for Headache (C/o HA   Started ) and Facial Pain (C/o pain/tingling on L side of face after being treated for eye inf. Used neo/poly/dex drops. Started about 2 wks. Area has improved. Wants to r/o shingles. )   Recent conjunctivitis infection treated by eye doctor Glennon Mac) with neomycin/polymyxin B/dextromethasone eye drops. Symptoms started about 2 weeks ago. After starting eye drops, had dull bruise-like ache intermittently to left side of headache above left ear. Treated with advil '200mg'$  with benefit. Eye never painful or itchy or uncomfortable, no drainage or eye sensitivity.   Currently having mild numbness/tingling to left side of face. No burning pain. No rash or ear canal pain. No jaw claudication. No vision changes. No oral lesions.  He has completed shingrix vaccines (2021).   Vit B12 def- continues chronic monthly b12 shots, last 06/21/2022.      Relevant past medical, surgical, family and social history reviewed and updated as indicated. Interim medical history since our last visit reviewed. Allergies and medications reviewed and updated. Outpatient Medications Prior to Visit  Medication Sig Dispense Refill   amLODipine (NORVASC) 5 MG tablet Take 1 tablet (5 mg total) by mouth daily. 90 tablet 4   colestipol (COLESTID) 1 g tablet TAKE 4 TABLETS BY MOUTH EVERY DAY AS DIRECTED 360 tablet 3   cyanocobalamin (,VITAMIN B-12,) 1000 MCG/ML injection Inject 1,000 mcg into the muscle every 30 (thirty) days. Chronic therapy     docusate sodium (COLACE) 100 MG capsule Take 1 capsule (100 mg total) by mouth daily.  (Patient taking differently: Take 100 mg by mouth daily as needed.) 10 capsule 0   fluticasone (FLONASE) 50 MCG/ACT nasal spray Place 1 spray into both nostrils daily.      hydrocortisone 2.5 % cream Apply topically 2 (two) times daily as needed (Rash). 28 g 5   ketoconazole (NIZORAL) 2 % shampoo apply three times per week, massage into scalp, behind ears and leave in for 10 minutes before rinsing out 120 mL 11   No facility-administered medications prior to visit.     Per HPI unless specifically indicated in ROS section below Review of Systems  Objective:  BP 132/78   Pulse 72   Temp (!) 97.3 F (36.3 C) (Temporal)   Ht '5\' 11"'$  (1.803 m)   Wt 177 lb 4 oz (80.4 kg)   SpO2 96%   BMI 24.72 kg/m   Wt Readings from Last 3 Encounters:  07/31/22 177 lb 4 oz (80.4 kg)  05/28/22 173 lb (78.5 kg)  05/28/22 173 lb 12.8 oz (78.8 kg)      Physical Exam Vitals and nursing note reviewed.  Constitutional:      Appearance: Normal appearance. He is not ill-appearing.  HENT:     Head: Normocephalic and atraumatic.     Right Ear: Hearing, tympanic membrane, ear canal and external ear normal. There is no impacted cerumen.     Left Ear: Hearing, tympanic membrane, ear canal and external ear  normal. There is no impacted cerumen.     Nose: No mucosal edema.     Right Turbinates: Not enlarged or swollen.     Left Turbinates: Not enlarged or swollen.     Right Sinus: No maxillary sinus tenderness or frontal sinus tenderness.     Left Sinus: No maxillary sinus tenderness or frontal sinus tenderness.     Mouth/Throat:     Mouth: Mucous membranes are moist.     Pharynx: Oropharynx is clear. No oropharyngeal exudate or posterior oropharyngeal erythema.  Eyes:     General:        Right eye: No discharge.        Left eye: No discharge.     Extraocular Movements: Extraocular movements intact.     Conjunctiva/sclera: Conjunctivae normal.     Pupils: Pupils are equal, round, and reactive to light.   Musculoskeletal:     Cervical back: Normal range of motion and neck supple. No rigidity.     Right lower leg: No edema.     Left lower leg: No edema.  Lymphadenopathy:     Head:     Right side of head: No submental, submandibular, tonsillar, preauricular or posterior auricular adenopathy.     Left side of head: No submental, submandibular, tonsillar, preauricular or posterior auricular adenopathy.     Cervical: No cervical adenopathy.     Right cervical: No superficial cervical adenopathy.    Left cervical: No superficial cervical adenopathy.     Upper Body:     Right upper body: No supraclavicular adenopathy.     Left upper body: No supraclavicular adenopathy.  Skin:    General: Skin is warm and dry.     Findings: No rash.  Neurological:     Mental Status: He is alert.     Comments:  CN 2-12 intact EOMI FTN intact  Psychiatric:        Mood and Affect: Mood normal.        Behavior: Behavior normal.        Assessment & Plan:   Problem List Items Addressed This Visit     Left-sided headache - Primary    Acute left temporoparietal headache while receiving treatment for bacterial conjunctivitis through eye doctor with neomycin - polymyxin B - dextromethasone. Overall reassuring exam today, symptoms are improved as well. ?referred pain. Not consistent with trigeminal neuralgia, TMJ dysfunction. ?rashless shingles after shingrix vaccination - outside of window for antiviral and symptoms are largely resolved - will continue to monitor, update if recurrent or ongoing.        No orders of the defined types were placed in this encounter.   No orders of the defined types were placed in this encounter.   Patient Instructions  Exam ok today.  Possibly eye infection caused referred pain to left side of head.  Let me know if recurrent or ongoing pain.   Follow up plan: Return if symptoms worsen or fail to improve.  Ria Bush, MD

## 2022-07-31 NOTE — Patient Instructions (Addendum)
Exam ok today.  Possibly eye infection caused referred pain to left side of head.  Let me know if recurrent or ongoing pain.

## 2022-08-02 ENCOUNTER — Ambulatory Visit (INDEPENDENT_AMBULATORY_CARE_PROVIDER_SITE_OTHER): Payer: Medicare PPO

## 2022-08-02 DIAGNOSIS — E538 Deficiency of other specified B group vitamins: Secondary | ICD-10-CM | POA: Diagnosis not present

## 2022-08-02 MED ORDER — CYANOCOBALAMIN 1000 MCG/ML IJ SOLN
1000.0000 ug | Freq: Once | INTRAMUSCULAR | Status: AC
  Administered 2022-08-02: 1000 ug via INTRAMUSCULAR

## 2022-08-02 NOTE — Progress Notes (Signed)
Per orders of Dr. Ria Bush who is out of office and Dr Elsie Stain who is in office today, injection of Vitamin B 12 given by Ozzie Hoyle. Patient tolerated injection well.

## 2022-08-30 ENCOUNTER — Encounter: Payer: Self-pay | Admitting: Dermatology

## 2022-08-30 ENCOUNTER — Ambulatory Visit: Payer: Medicare PPO | Admitting: Dermatology

## 2022-08-30 VITALS — BP 129/81 | HR 80

## 2022-08-30 DIAGNOSIS — L409 Psoriasis, unspecified: Secondary | ICD-10-CM

## 2022-08-30 DIAGNOSIS — D225 Melanocytic nevi of trunk: Secondary | ICD-10-CM | POA: Diagnosis not present

## 2022-08-30 DIAGNOSIS — L219 Seborrheic dermatitis, unspecified: Secondary | ICD-10-CM | POA: Diagnosis not present

## 2022-08-30 DIAGNOSIS — D229 Melanocytic nevi, unspecified: Secondary | ICD-10-CM

## 2022-08-30 MED ORDER — HALOBETASOL PROPIONATE 0.05 % EX OINT: TOPICAL_OINTMENT | Freq: Two times a day (BID) | CUTANEOUS | 2 refills | Status: AC

## 2022-08-30 NOTE — Patient Instructions (Addendum)
Sample of Zoryve given to use once daily to affected areas at hands, ears, behind ears. Sample of Vtama given to use once daily to affected areas at hands, ears, behind ears. Start halobetasol ointment twice daily to affected area behind ears for up to 2 weeks then weekends only. Avoid applying to face, groin, and axilla. Use as directed. Long-term use can cause thinning of the skin.  Topical steroids (such as triamcinolone, fluocinolone, fluocinonide, mometasone, clobetasol, halobetasol, betamethasone, hydrocortisone) can cause thinning and lightening of the skin if they are used for too long in the same area. Your physician has selected the right strength medicine for your problem and area affected on the body. Please use your medication only as directed by your physician to prevent side effects.   Due to recent changes in healthcare laws, you may see results of your pathology and/or laboratory studies on MyChart before the doctors have had a chance to review them. We understand that in some cases there may be results that are confusing or concerning to you. Please understand that not all results are received at the same time and often the doctors may need to interpret multiple results in order to provide you with the best plan of care or course of treatment. Therefore, we ask that you please give [place] 2 business days to thoroughly review all your results before contacting the office for clarification. Should we see a critical lab result, you will be contacted sooner.   If You Need Anything After Your Visit  If you have any questions or concerns for your doctor, please call our main line at [phone] and press option 4 to reach your doctor's medical assistant. If no one answers, please leave a voicemail as directed and we will return your call as soon as possible. Messages left after 4 pm will be answered the following business day.   You may also send [place] a message via [place]. We typically respond  to MyChart messages within 1-2 business days.  For prescription refills, please ask your pharmacy to contact our office. Our fax number is [phone].  If you have an urgent issue when the clinic is closed that cannot wait until the next business day, you can page your doctor at the number below.    Please note that while we do our best to be available for urgent issues outside of office hours, we are not available 24/7.   If you have an urgent issue and are unable to reach [place], you may choose to seek medical care at your doctor's office, retail clinic, urgent care center, or emergency room.  If you have a medical emergency, please immediately call 911 or go to the emergency department.  Pager Numbers  - Dr. Nehemiah Massed: [phone]  - Dr. Laurence Ferrari: [phone]  - Dr. Nicole Kindred: [phone]  In the event of inclement weather, please call our main line at [phone] for an update on the status of any delays or closures.  Dermatology Medication Tips: Please keep the boxes that topical medications come in in order to help keep track of the instructions about where and how to use these. Pharmacies typically print the medication instructions only on the boxes and not directly on the medication tubes.   If your medication is too expensive, please contact our office at [phone] option 4 or send [place] a message through [place].   We are unable to tell what your co-pay for medications will be in advance as this is different depending on your insurance  coverage. However, we may be able to find a substitute medication at lower cost or fill out paperwork to get insurance to cover a needed medication.   If a prior authorization is required to get your medication covered by your insurance company, please allow [place] 1-2 business days to complete this process.  Drug prices often vary depending on where the prescription is filled and some pharmacies may offer cheaper prices.  The website www.goodrx.com  contains coupons for medications through different pharmacies. The prices here do not account for what the cost may be with help from insurance (it may be cheaper with your insurance), but the website can give you the price if you did not use any insurance.  - You can print the associated coupon and take it with your prescription to the pharmacy.  - You may also stop by our office during regular business hours and pick up a GoodRx coupon card.  - If you need your prescription sent electronically to a different pharmacy, notify our office through [facility] or by phone at [phone] option 4.     Si Usted Necesita Algo Despus de Su Visita  Tambin puede enviarnos un mensaje a travs de Pharmacist, community. Por lo general respondemos a los mensajes de MyChart en el transcurso de 1 a 2 das hbiles.  Para renovar recetas, por favor pida a su farmacia que se ponga en contacto con nuestra oficina. Harland Dingwall de fax es [place] [phone].  Si tiene un asunto urgente cuando la clnica est cerrada y que no puede esperar hasta el siguiente da hbil, puede llamar/localizar a su doctor(a) al nmero que aparece a continuacin.   Por favor, tenga en cuenta que aunque hacemos todo lo posible para estar disponibles para asuntos urgentes fuera del horario de [place], no estamos disponibles las 24 horas del da, los 7 das de la [place].   Si tiene un problema urgente y no puede comunicarse con nosotros, puede optar por buscar atencin mdica  en el consultorio de su doctor(a), en una clnica privada, en un centro de atencin urgente o en una sala de emergencias.  Si tiene Engineering geologist, por favor llame inmediatamente al 911 o vaya a la sala de emergencias.  Nmeros de bper  - Dr. Nehemiah Massed: [phone]  - Dra. Moye: [phone]  - Dra. Nicole Kindred: [phone]  En caso de inclemencias del [place], por favor llame a Johnsie Kindred principal al [phone] para una actualizacin sobre el [place]  de cualquier retraso o cierre.  Consejos para la medicacin en dermatologa: Por favor, guarde las cajas en las que vienen los medicamentos de uso tpico para ayudarle a seguir las instrucciones sobre dnde y cmo usarlos. Las farmacias generalmente imprimen las instrucciones del medicamento slo en las cajas y no directamente en los tubos del [place].   Si su medicamento es muy caro, por favor, pngase en contacto con Zigmund Daniel llamando al [phone] y presione la opcin 4 o envenos un mensaje a travs de Pharmacist, community.   No podemos decirle cul ser su copago por los medicamentos por adelantado ya que esto es diferente dependiendo de la cobertura de su seguro. Sin embargo, es posible que podamos encontrar un medicamento sustituto a Electrical engineer un formulario para que el seguro cubra el medicamento que se considera necesario.   Si se requiere una autorizacin previa para que su compaa de seguros [place] su medicamento, por favor permtanos de 1 a 2 das hbiles para completar este proceso.  [place]  de los medicamentos varan con frecuencia dependiendo del lugar de dnde se surte la receta y alguna farmacias pueden ofrecer precios ms baratos.  El sitio web www.goodrx.com tiene cupones para medicamentos de Airline pilot. Los precios aqu no tienen en cuenta lo que podra costar con la ayuda del seguro (puede ser ms barato con su seguro), pero el sitio web puede darle el precio si no utiliz Research scientist (physical sciences).  - Puede imprimir el cupn correspondiente y llevarlo con su receta a la farmacia.  - Tambin puede pasar por nuestra oficina durante el horario de atencin regular y Charity fundraiser una tarjeta de cupones de GoodRx.  - Si necesita que su receta se enve electrnicamente a una farmacia diferente, informe a nuestra oficina a travs de MyChart de [place] o por telfono llamando al [phone] y presione la opcin 4.

## 2022-08-30 NOTE — Progress Notes (Signed)
Follow-Up Visit   Subjective  Paul Atkinson is a 72 y.o. male who presents for the following: Seborrheic Dermatitis (Using ketoconazole 2% shampoo, ketoconazole 2% cream, clobetasol 0.05% foam and HC 2.5% cream. Patient feels that it helps a little bit but is flared behind ears and scalp. ).   The following portions of the chart were reviewed this encounter and updated as appropriate:   Tobacco  Allergies  Meds  Problems  Med Hx  Surg Hx  Fam Hx      Review of Systems:  No other skin or systemic complaints except as noted in HPI or Assessment and Plan.  Objective  Well appearing patient in no apparent distress; mood and affect are within normal limits.  A focused examination was performed including scalp, back, hands, face, ears. Relevant physical exam findings are noted in the Assessment and Plan.  Mid Back Right of Midline 0.5 cm medium to slightly dark brown thin papule with perifollicular dropout   hands Scaly pink plaques at hands, left postauricular, ear canals    Assessment & Plan  Seborrheic dermatitis  Related Medications hydrocortisone 2.5 % cream Apply topically 2 (two) times daily as needed (Rash).  ketoconazole (NIZORAL) 2 % shampoo apply three times per week, massage into scalp, behind ears and leave in for 10 minutes before rinsing out  Nev[place] Mid Back Right of Midline  Benign-appearing. Stable compared to previous visit. Observation.  Call clinic for new or changing moles.  Recommend daily use of broad spectrum spf 30+ sunscreen to sun-exposed areas.    Psoriasis hands  With seborrheic dermatitis overlap at ears and scalp.  Chronic and persistent condition with duration or expected duration over one year. Condition is bothersome/symptomatic for patient. Currently flared at postauricular scalp and not at goal at ears, hands  Counseling on psoriasis and coordination of care  psoriasis is a chronic non-curable, but treatable genetic/hereditary  disease that may have other systemic features affecting other organ systems such as joints (Psoriatic Arthritis). It is associated with an increased risk of inflammatory bowel disease, heart disease, non-alcoholic fatty liver disease, and depression.  Treatments include light and laser treatments; topical medications; and systemic medications including oral and injectables.  + for joint pain. Worse in the morning. Patient saw rheumatologist 09/2019. They were not concerned about psoriatic arthritis. Recommend patient follow up with rheumatology. He is considering moving to [facility] and will see rheumatologist in that area.  Sample of Zoryve given to patient to use once daily to affected areas at hands, ears, behind ears. Sample of Vtama given to patient to use once daily to affected areas at hands, ears, behind ears. Start halobetasol ointment twice daily to affected area behind ears for up to 2 weeks then weekends only. Avoid applying to face, groin, and axilla. Use as directed. Long-term use can cause thinning of the skin.  Augmented betamethasone if halobetasol not covered.   Topical steroids (such as triamcinolone, fluocinolone, fluocinonide, mometasone, clobetasol, halobetasol, betamethasone, hydrocortisone) can cause thinning and lightening of the skin if they are used for too long in the same area. Your physician has selected the right strength medicine for your problem and area affected on the body. Please use your medication only as directed by your physician to prevent side effects.   Did discuss otezla, biologics if not clearing.  Continue phototherapy to hands   halobetasol (ULTRAVATE) 0.05 % ointment - hands Apply topically 2 (two) times daily. Up to 2 weeks and then on weekends only  for rash behind ears or stubborn areas at hands.   Return for 2-3 months psoriasis.  Graciella Belton, RMA, am acting as scribe for Forest Gleason, MD .  Documentation: I have reviewed the above  documentation for accuracy and completeness, and I agree with the above.  Forest Gleason, MD

## 2022-09-01 ENCOUNTER — Encounter: Payer: Self-pay | Admitting: Dermatology

## 2022-09-04 ENCOUNTER — Ambulatory Visit (INDEPENDENT_AMBULATORY_CARE_PROVIDER_SITE_OTHER): Payer: Medicare PPO | Admitting: *Deleted

## 2022-09-04 DIAGNOSIS — E538 Deficiency of other specified B group vitamins: Secondary | ICD-10-CM

## 2022-09-04 MED ORDER — CYANOCOBALAMIN 1000 MCG/ML IJ SOLN
1000.0000 ug | Freq: Once | INTRAMUSCULAR | Status: AC
  Administered 2022-09-04: 1000 ug via INTRAMUSCULAR

## 2022-09-04 NOTE — Progress Notes (Signed)
Per orders of Dr. Gutierrez, injection of Vitamin B-12 given by Threasa Kinch. Patient tolerated injection well. 

## 2022-10-30 ENCOUNTER — Ambulatory Visit (INDEPENDENT_AMBULATORY_CARE_PROVIDER_SITE_OTHER): Payer: Medicare PPO

## 2022-10-30 DIAGNOSIS — E538 Deficiency of other specified B group vitamins: Secondary | ICD-10-CM

## 2022-10-30 MED ORDER — CYANOCOBALAMIN 1000 MCG/ML IJ SOLN
1000.0000 ug | Freq: Once | INTRAMUSCULAR | Status: AC
  Administered 2022-10-30: 1000 ug via INTRAMUSCULAR

## 2022-10-30 NOTE — Progress Notes (Signed)
Per orders of Dr. Gutierrez, monthly injection of B12 1000 mcg/ml given by Brilee [place] Myrna Vonseggern, CMA in Left Deltoid. Patient tolerated injection well.  

## 2022-12-06 ENCOUNTER — Ambulatory Visit (INDEPENDENT_AMBULATORY_CARE_PROVIDER_SITE_OTHER): Payer: Medicare PPO

## 2022-12-06 ENCOUNTER — Ambulatory Visit: Payer: Medicare PPO | Admitting: Dermatology

## 2022-12-06 DIAGNOSIS — E538 Deficiency of other specified B group vitamins: Secondary | ICD-10-CM | POA: Diagnosis not present

## 2022-12-06 MED ORDER — CYANOCOBALAMIN 1000 MCG/ML IJ SOLN
1000.0000 ug | Freq: Once | INTRAMUSCULAR | Status: AC
  Administered 2022-12-06: 1000 ug via INTRAMUSCULAR

## 2022-12-06 NOTE — Progress Notes (Signed)
Per orders of Dr. Graham Duncan, injection of vitamin b 12 given by Jazmon Kos in right deltoid. Patient tolerated injection well. Patient will make appointment for 1 month.   

## 2023-01-23 ENCOUNTER — Ambulatory Visit (INDEPENDENT_AMBULATORY_CARE_PROVIDER_SITE_OTHER): Payer: Medicare PPO

## 2023-01-23 DIAGNOSIS — E538 Deficiency of other specified B group vitamins: Secondary | ICD-10-CM

## 2023-01-23 MED ORDER — CYANOCOBALAMIN 1000 MCG/ML IJ SOLN
1000.0000 ug | Freq: Once | INTRAMUSCULAR | Status: AC
  Administered 2023-01-23: 1000 ug via INTRAMUSCULAR

## 2023-01-23 NOTE — Progress Notes (Signed)
Per orders of Dr. Eustaquio Boyden, injection of vitamin B 12  given by Lewanda Rife in left deltoid. Patient tolerated injection well. Patient will make appointment for 1 month.

## 2023-02-12 ENCOUNTER — Ambulatory Visit: Payer: Medicare PPO | Admitting: Dermatology

## 2023-02-12 ENCOUNTER — Other Ambulatory Visit: Payer: Self-pay

## 2023-02-12 ENCOUNTER — Encounter: Payer: Self-pay | Admitting: Dermatology

## 2023-02-12 DIAGNOSIS — L409 Psoriasis, unspecified: Secondary | ICD-10-CM

## 2023-02-12 DIAGNOSIS — L219 Seborrheic dermatitis, unspecified: Secondary | ICD-10-CM

## 2023-02-12 NOTE — Progress Notes (Signed)
   Follow-Up Visit   Subjective  Paul Atkinson is a 72 y.o. male who presents for the following: 6 month seb derm and psoriasis follow up. Patient using ketoconazole 2% shampoo once weekly to scalp. When he does have a flare he will use the halobetasol ointment behind ears and Zoryve at cheeks. Patient was using UVB light box at home for hands but has not had to use it in over a year.   Patient would like referral to rheumatology for joint pain > psoriasis. He did see Dr. Renard Matter in [place] but she has left and he has one in mind that he would like to see.   The patient has spots, moles and lesions to be evaluated, some may be new or changing and the patient may have concern these could be cancer.   The following portions of the chart were reviewed this encounter and updated as appropriate: medications, allergies, medical history  Review of Systems:  No other skin or systemic complaints except as noted in HPI or Assessment and Plan.  Objective  Well appearing patient in no apparent distress; mood and affect are within normal limits.   A focused examination was performed of the following areas: Scalp, face, ears and hands  Relevant exam findings are noted in the Assessment and Plan.    Assessment & Plan   PSORIASIS Exam: clear on hands  Chronic condition with duration or expected duration over one year. Currently well-controlled.   + for joint pain. Patient has seen ortho in the past for pain at ankles. Patient c/o joint pain at knees, worse in the morning for at least 20 minutes after waking. He does take Tylenol Arthritis. Patient is relocating and is concerned about buying a home with stairs. Fingers, thumbs hurt sometimes.  Will send referral to Dr. Denyce Robert at [facility].   Psoriasis is a chronic non-curable, but treatable genetic/hereditary disease that may have other systemic features affecting other organ systems such as joints (Psoriatic Arthritis). It is associated with an  increased risk of inflammatory bowel disease, heart disease, non-alcoholic fatty liver disease, and depression.  Treatments include light and laser treatments; topical medications; and systemic medications including oral and injectables.  Treatment Plan: Continue UVB light box treatments to hands as needed.  Sent referral to [facility] rheumatology Dr Denyce Robert to evaluate arthritis  SEBORRHEIC DERMATITIS Exam: Pink patches with greasy scale at left > right perinasal and left postauricular sulcus  Chronic and persistent condition with duration or expected duration over one year. Condition is symptomatic/ bothersome to patient. Not currently at goal.   Seborrheic Dermatitis is a chronic persistent rash characterized by pinkness and scaling most commonly of the mid face but also can occur on the scalp (dandruff), ears; mid chest, mid back and groin.  It tends to be exacerbated by stress and cooler weather.  People who have neurologic disease may experience new onset or exacerbation of existing seborrheic dermatitis.  The condition is not curable but treatable and can be controlled.  Treatment Plan: Continue ketoconazole 2% shampoo once weekly on scalp Zoryve daily on face Halobetasol sparingly BID until clear on postauricular lesion   Return in about 6 months (around 08/15/2023) for TBSE, Hx BCC.  Anise Salvo, RMA, am acting as scribe for Elie Goody, MD .   Documentation: I have reviewed the above documentation for accuracy and completeness, and I agree with the above.  Elie Goody, MD

## 2023-02-12 NOTE — Patient Instructions (Signed)

## 2023-02-18 ENCOUNTER — Telehealth: Payer: Self-pay

## 2023-02-18 NOTE — Telephone Encounter (Signed)
Patient called the office and left voicemail that he would like rheumatology referral to [facility], [place] to send?

## 2023-02-19 NOTE — Telephone Encounter (Signed)
Patient advised referral sent to [facility]. aw

## 2023-02-26 DIAGNOSIS — H40013 Open angle with borderline findings, low risk, bilateral: Secondary | ICD-10-CM | POA: Diagnosis not present

## 2023-03-08 DIAGNOSIS — M25561 Pain in right knee: Secondary | ICD-10-CM | POA: Diagnosis not present

## 2023-03-08 DIAGNOSIS — M25562 Pain in left knee: Secondary | ICD-10-CM | POA: Diagnosis not present

## 2023-03-08 DIAGNOSIS — M170 Bilateral primary osteoarthritis of knee: Secondary | ICD-10-CM | POA: Diagnosis not present

## 2023-03-12 ENCOUNTER — Ambulatory Visit (INDEPENDENT_AMBULATORY_CARE_PROVIDER_SITE_OTHER): Payer: Medicare PPO

## 2023-03-12 DIAGNOSIS — E538 Deficiency of other specified B group vitamins: Secondary | ICD-10-CM | POA: Diagnosis not present

## 2023-03-12 MED ORDER — CYANOCOBALAMIN 1000 MCG/ML IJ SOLN
1000.0000 ug | Freq: Once | INTRAMUSCULAR | Status: AC
  Administered 2023-03-12: 1000 ug via INTRAMUSCULAR

## 2023-03-12 NOTE — Progress Notes (Signed)
Per orders of Dr. Sharen Hones, injection of monthly B12 1000 mcg per ml given by Eual Fines, CMA in Right Deltoid. Patient tolerated injection well.

## 2023-03-28 IMAGING — DX DG ABDOMEN 2V
3 series · 3 of 3 positions shown · non-contrast
Comparison: 09/20/2008

CLINICAL DATA: Abdomen pain with nausea vomiting history of Crohn's

EXAM:
ABDOMEN - 2 VIEW

[abdomen erect]
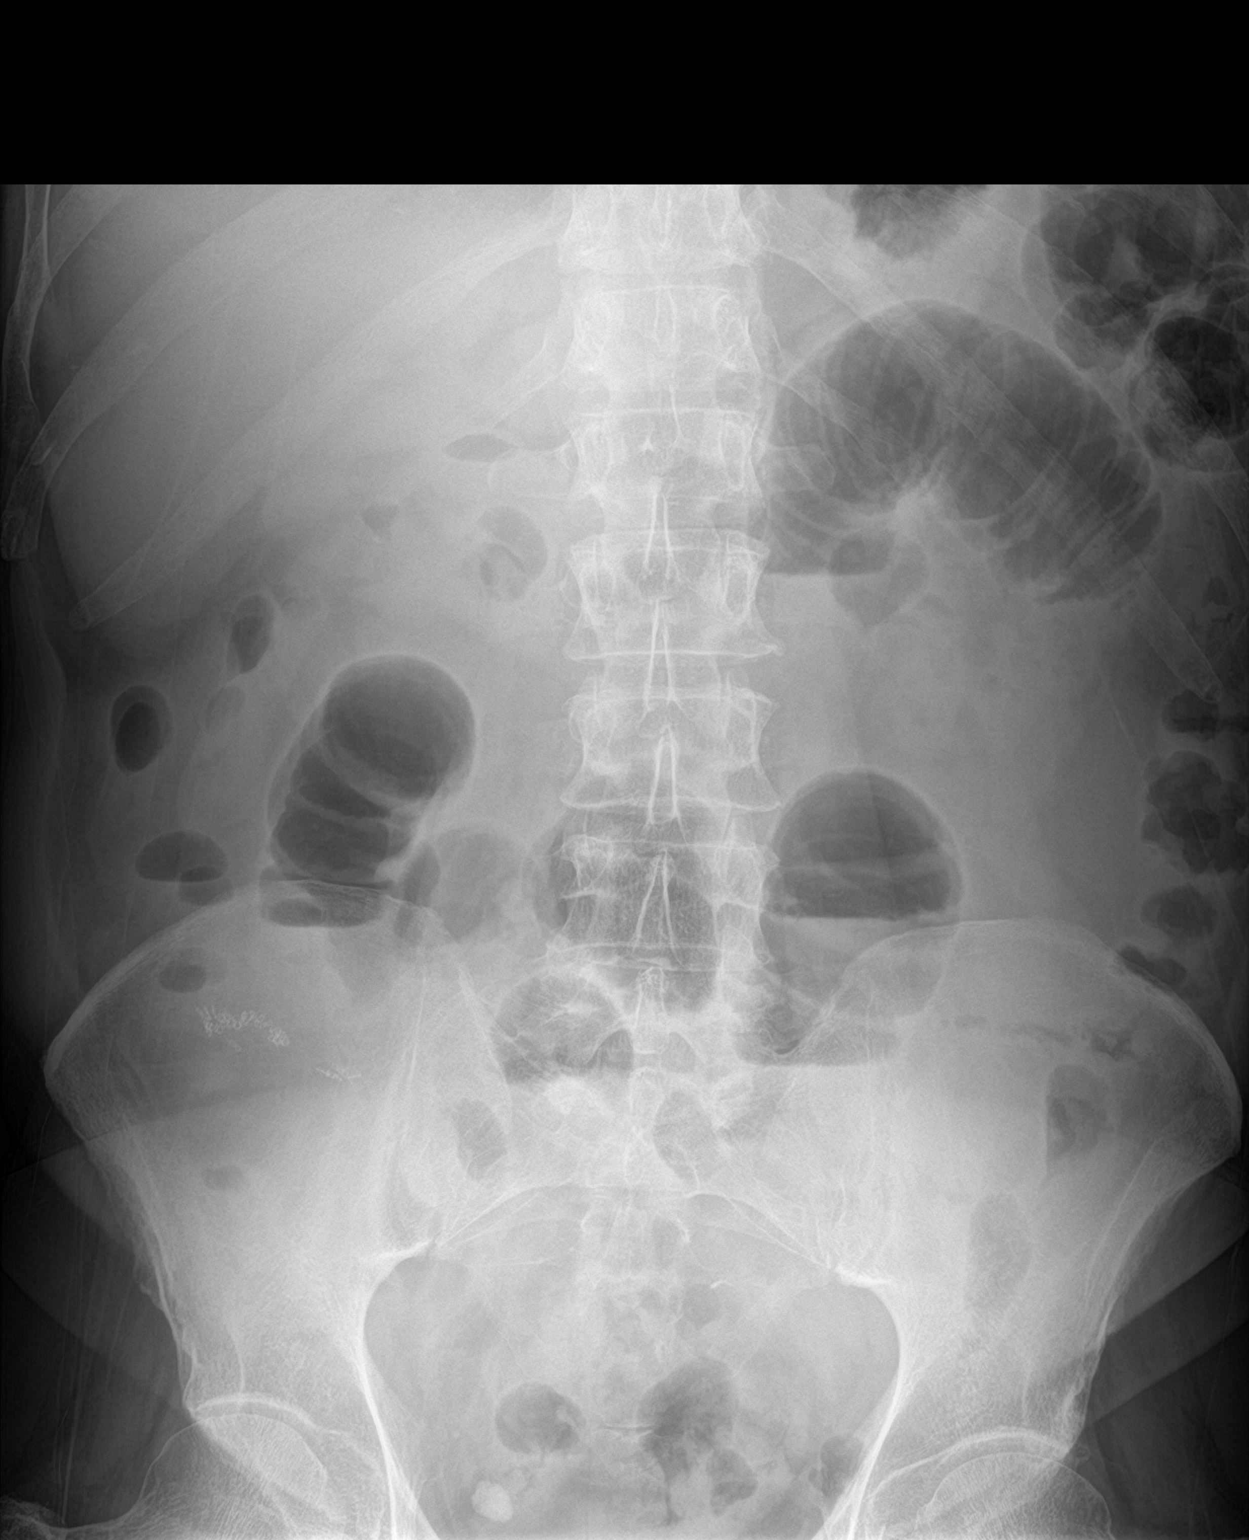

[abdomen supine (1 of 2)]
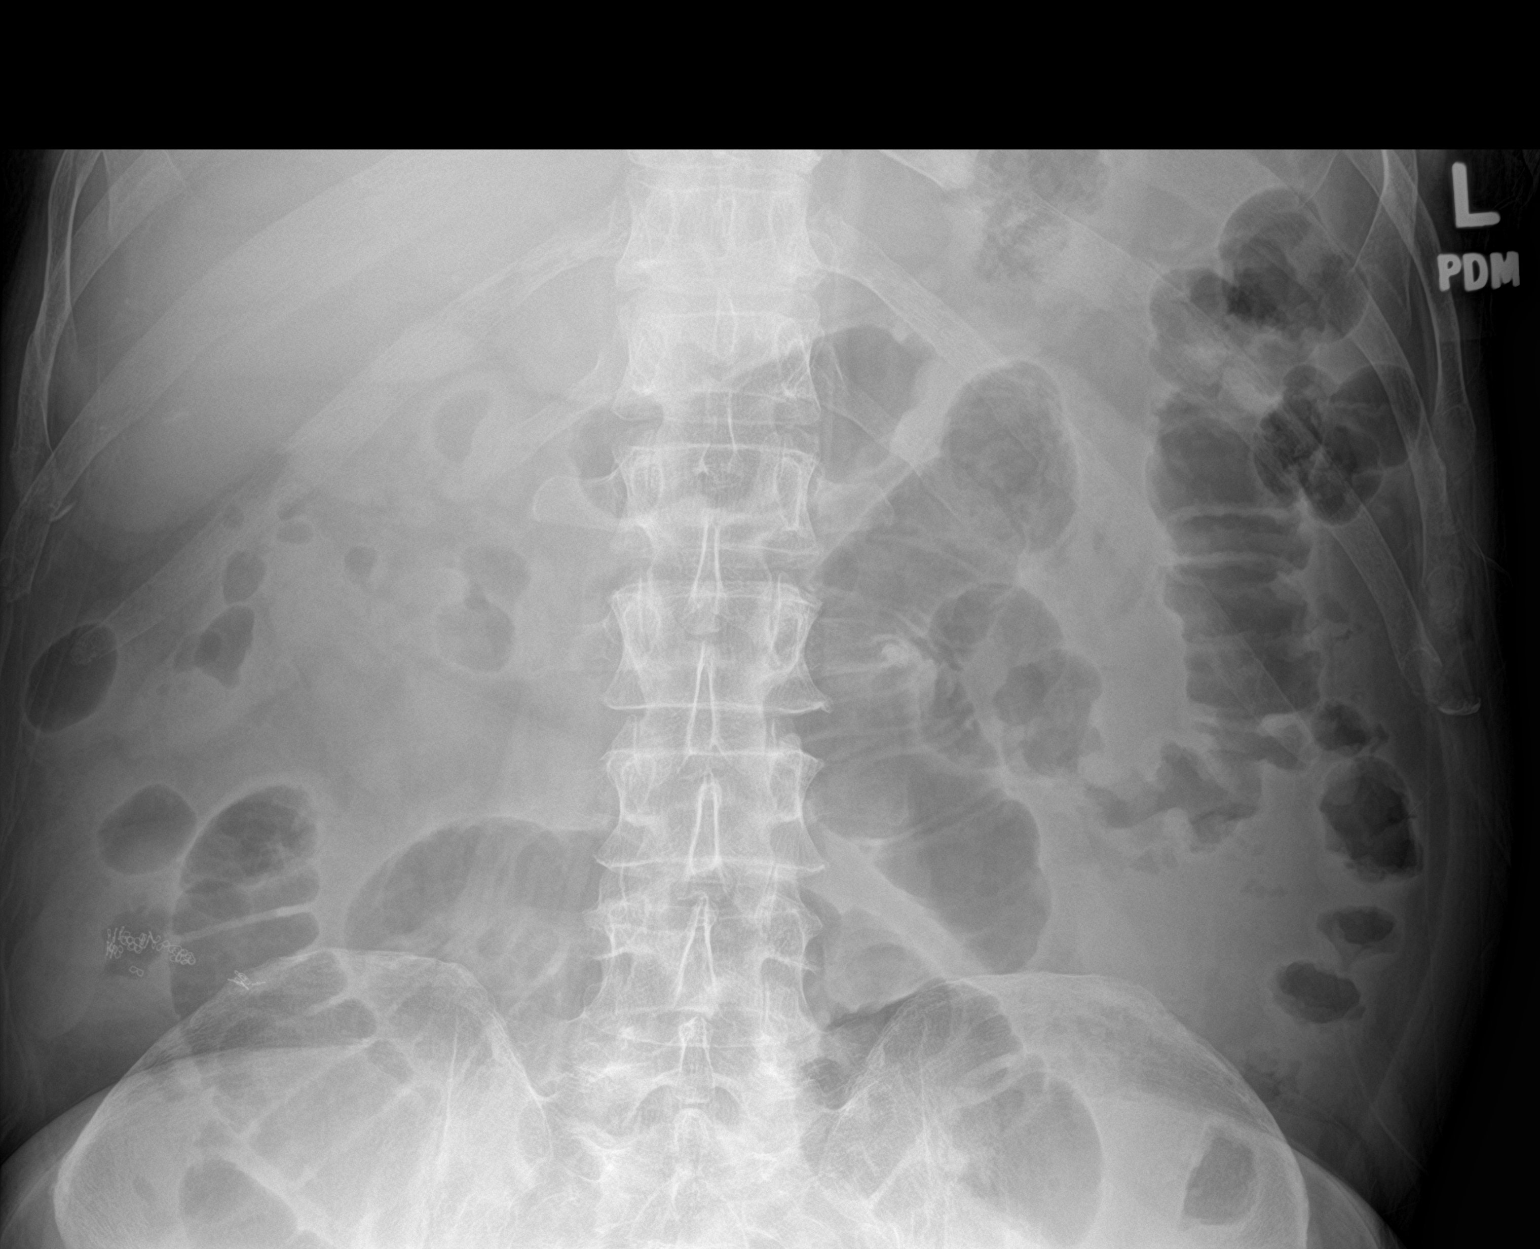

[abdomen supine (2 of 2)]
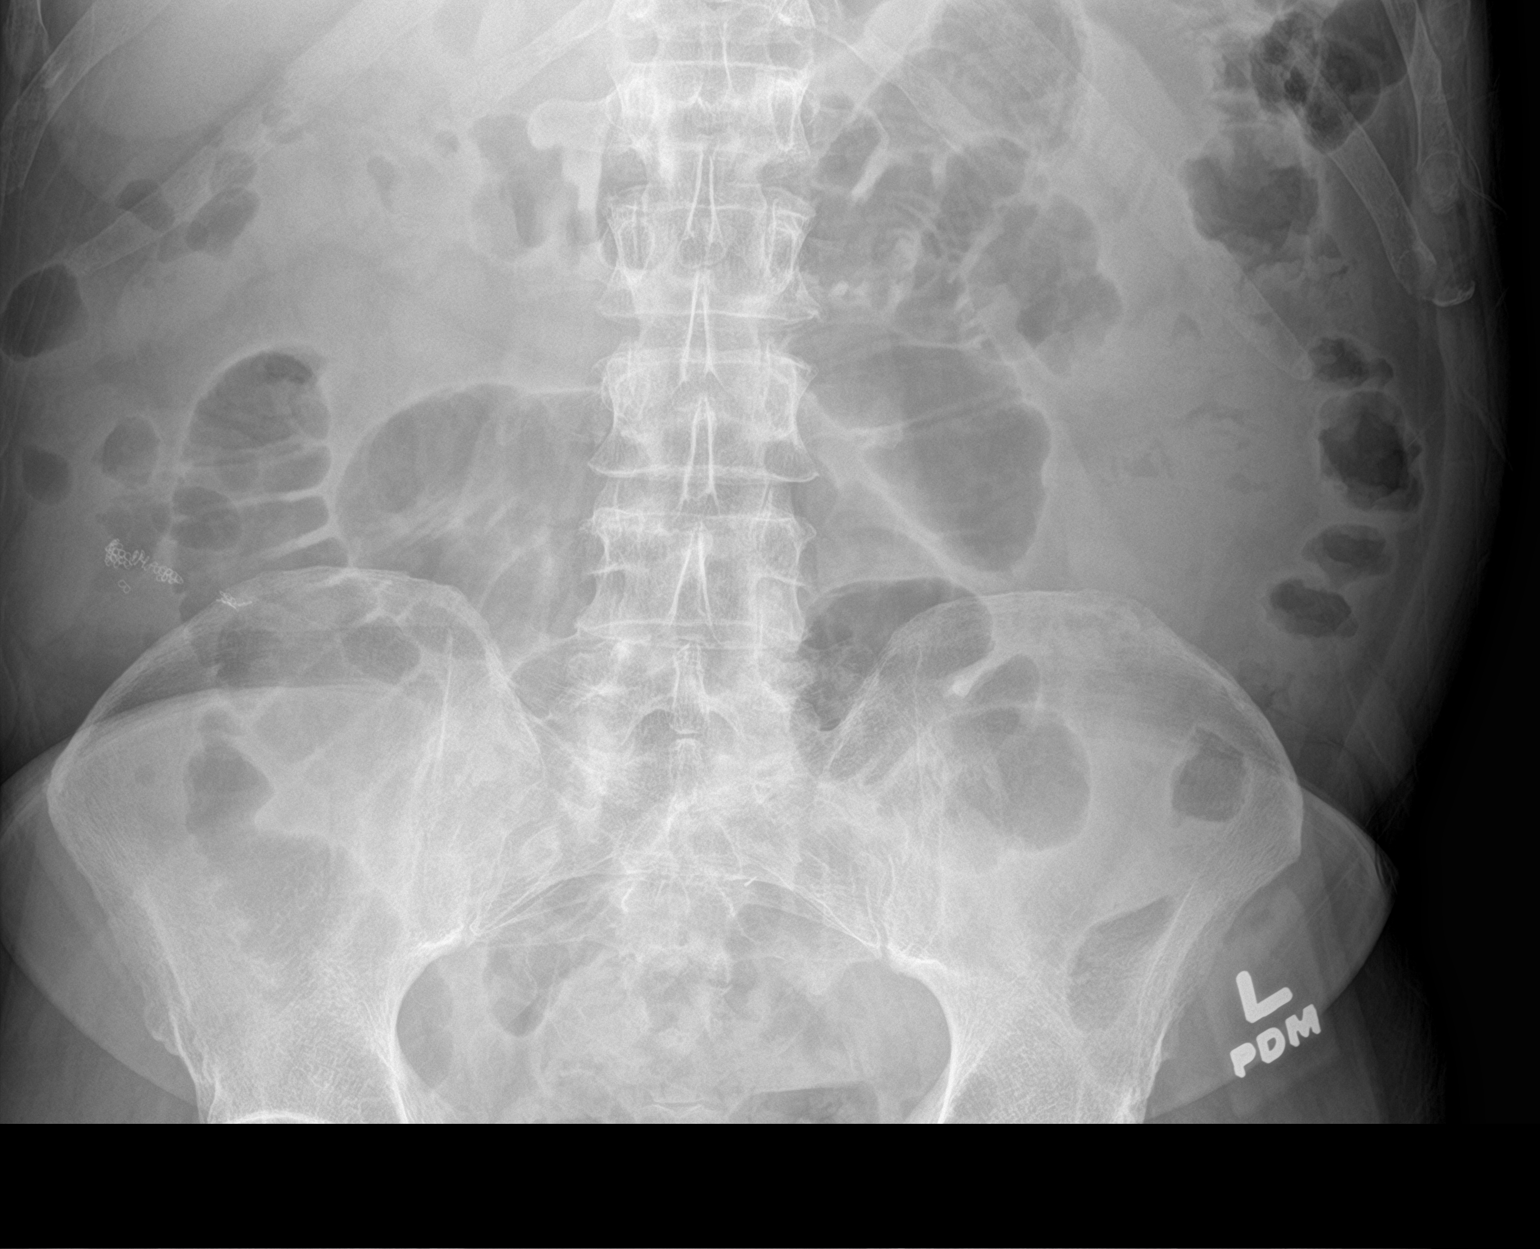

[3 of 3 positions shown; findings below may reference images not displayed]

FINDINGS: No free air beneath the diaphragm. Dilated small bowel up to 5.1 cm
with multiple fluid levels. Scattered colon gas. Postsurgical
changes in the right lower quadrant. Prominent oval calcification in
the right pelvis.
IMPRESSION: Dilated small bowel with multiple fluid levels though with scattered
colon gas, findings could be secondary to partial or developing
bowel obstruction. Consider CT for further evaluation.

These results will be called to the ordering clinician or
representative by the Radiologist Assistant, and communication
documented in the PACS or [REDACTED].

## 2023-04-05 ENCOUNTER — Other Ambulatory Visit: Payer: Self-pay | Admitting: Internal Medicine

## 2023-05-21 ENCOUNTER — Ambulatory Visit (INDEPENDENT_AMBULATORY_CARE_PROVIDER_SITE_OTHER): Payer: Medicare PPO

## 2023-05-21 DIAGNOSIS — E538 Deficiency of other specified B group vitamins: Secondary | ICD-10-CM | POA: Diagnosis not present

## 2023-05-21 MED ORDER — CYANOCOBALAMIN 1000 MCG/ML IJ SOLN
1000.0000 ug | Freq: Once | INTRAMUSCULAR | Status: AC
  Administered 2023-05-21: 1000 ug via INTRAMUSCULAR

## 2023-05-21 NOTE — Progress Notes (Signed)
Patient presented for B 12 injection given by Rhylan Kagel, CMA to left deltoid, patient voiced no concerns nor showed any signs of distress during injection.  

## 2023-05-30 ENCOUNTER — Ambulatory Visit: Payer: Medicare PPO

## 2023-05-30 VITALS — Ht 71.75 in | Wt 172.0 lb

## 2023-05-30 DIAGNOSIS — Z0000 Encounter for general adult medical examination without abnormal findings: Secondary | ICD-10-CM | POA: Diagnosis not present

## 2023-05-30 NOTE — Patient Instructions (Addendum)
Mr. Calkin , Thank you for taking time to come for your Medicare Wellness Visit. I appreciate your ongoing commitment to your health goals. Please review the following plan we discussed and let me know if I can assist you in the future.   Referrals/Orders/Follow-Ups/Clinician Recommendations:   Pt relocating to [facility] and needs new provider near [facility]. Information requested below:  [facility] [address] Edwyna Perfect [address] [place], [place] [address]  [phone] Fax#[phone]  [facility] at [facility] is near the intersection of [address] and [address] We are located on the third floor. Free parking is available directly outside of the building.  This is a list of the screening recommended for you and due dates:  Health Maintenance  Topic Date Due   COVID-19 Vaccine (5 - 2023-24 season) 06/15/2023*   Flu Shot  09/23/2023*   Medicare Annual Wellness Visit  05/29/2024   Colon Cancer Screening  01/27/2026   DTaP/Tdap/Td vaccine (4 - Td or Tdap) 08/19/2030   Pneumonia Vaccine  Completed   Hepatitis C Screening  Completed   Zoster (Shingles) Vaccine  Completed   HPV Vaccine  Aged Out  *Topic was postponed. The date shown is not the original due date.    Advanced directives: (In Chart) A copy of your advanced directives are scanned into your chart should your provider ever need it.  Next Medicare Annual Wellness Visit scheduled for next year: No pt re-locating

## 2023-05-30 NOTE — Progress Notes (Addendum)
Subjective:   Paul Atkinson is a 72 y.o. male who presents for Medicare Annual/Subsequent preventive examination.  Visit Complete: Virtual I connected with  Tennis Must on 05/30/23 by a audio enabled telemedicine application and verified that I am speaking with the correct person using two identifiers.  Patient Location: Home  Provider Location: Home Office  I discussed the limitations of evaluation and management by telemedicine. The patient expressed understanding and agreed to proceed.  Vital Signs: Because this visit was a virtual/telehealth visit, some criteria may be missing or patient reported. Any vitals not documented were not able to be obtained and vitals that have been documented are patient reported.  Patient Medicare AWV questionnaire was completed by the patient on 05/26/23; I have confirmed that all information answered by patient is correct and no changes since this date. Cardiac Risk Factors include: advanced age (>39men, >51 women);dyslipidemia;male gender;hypertension;sedentary lifestyle    Objective:    Today's Vitals   05/26/23 2327 05/30/23 1053  Weight:  172 lb (78 kg)  Height:  5' 11.75" (1.822 m)  PainSc: 3     Body mass index is 23.49 kg/m.     05/30/2023   11:05 AM 05/28/2022    3:35 PM 03/20/2019   10:31 AM 03/13/2018    8:15 AM 03/12/2017    1:24 PM  Advanced Directives  Does Patient Have a Medical Advance Directive? Yes Yes Yes Yes Yes  Type of Estate agent of [place];Living will Healthcare Power of [place];Living will Healthcare Power of [place];Living will Healthcare Power of [place];Living will Healthcare Power of [place];Living will  Does patient want to make changes to medical advance directive?     Yes (MAU/Ambulatory/Procedural Areas - Information given)  Copy of Healthcare Power of Attorney in Chart? Yes - validated most recent copy scanned in chart (See row information) No - copy requested No - copy requested  No - copy requested No - copy requested    Current Medications (verified) Outpatient Encounter Medications as of 05/30/2023  Medication Sig   amLODipine (NORVASC) 5 MG tablet Take 1 tablet (5 mg total) by mouth daily.   colestipol (COLESTID) 1 g tablet TAKE 4 TABLETS BY MOUTH EVERY DAY AS DIRECTED   cyanocobalamin (,VITAMIN B-12,) 1000 MCG/ML injection Inject 1,000 mcg into the muscle every 30 (thirty) days. Chronic therapy   docusate sodium (COLACE) 100 MG capsule Take 1 capsule (100 mg total) by mouth daily. (Patient taking differently: Take 100 mg by mouth daily as needed.)   fluticasone (FLONASE) 50 MCG/ACT nasal spray Place 1 spray into both nostrils daily.    halobetasol (ULTRAVATE) 0.05 % ointment Apply topically 2 (two) times daily. Up to 2 weeks and then on weekends only for rash behind ears or stubborn areas at hands.   hydrocortisone 2.5 % cream Apply topically 2 (two) times daily as needed (Rash).   ketoconazole (NIZORAL) 2 % shampoo apply three times per week, massage into scalp, behind ears and leave in for 10 minutes before rinsing out   No facility-administered encounter medications on file as of 05/30/2023.    Allergies (verified) Penicillins   History: Past Medical History:  Diagnosis Date   Allergy    mild   Anemia    iron def   Basal cell carcinoma    back, chest    Bile salt-induced diarrhea    Crohn disease (HCC)    status post ileocecectomy 1989   Disorder of inner ear    Hearing loss  Hx of duodenal ulcer    Hx SBO    Hypertension    Kidney stones    Malabsorption    Psoriasis    Ulcer    Vitamin B 12 deficiency    secondary to ileal resection   Past Surgical History:  Procedure Laterality Date   COLONOSCOPY  07/2016   patent anastomosis, no polyps, rpt 5 yrs Marina Goodell)   COLONOSCOPY  01/27/2021   superficial ulceration at ileocolonic anastomosis- benign biopsy, rpt 5 yrs Marina Goodell)   ILEOCECETOMY  1989   peritonitis with SBO from crohn's    KIDNEY STONE SURGERY     removal   MOLE REMOVAL     back   MOUTH SURGERY     SEPTOPLASTY  05/2004   due to deviated septum (Dr. Johnnette Litter)   Family History  Problem Relation Age of Onset   Heart disease Mother        CHF, arrhythmias   Kidney disease Mother        kidney failure   Hypertension Mother    Kidney disease Father        renal failure   CAD Father 59       MI stents   Diabetes Father 7       on insulin   Hyperlipidemia Father    Rectal cancer Sister    Cancer Neg Hx    Colon cancer Neg Hx    Pancreatic cancer Neg Hx    Esophageal cancer Neg Hx    Colon polyps Neg Hx    Stomach cancer Neg Hx    Social History   Socioeconomic History   Marital status: Widowed    Spouse name: Not on file   Number of children: 2   Years of education: Not on file   Highest education level: Not on file  Occupational History   Occupation: AGENT-retired    Employer: [employer]    Comment: Insurance  Tobacco Use   Smoking status: Never   Smokeless tobacco: Never  Vaping Use   Vaping status: Never Used  Substance and Sexual Activity   Alcohol use: No    Alcohol/week: 0.0 standard drinks of alcohol    Comment: very rarely   Drug use: No   Sexual activity: Yes  Other Topics Concern   Not on file  Social History Narrative   occasional caffeine   Widower - wife Cordelia Pen passed away from metastatic cancer 2019    Occupation: retired, Advertising account planner   Activity: no regular exercise but stays active   Diet: good water, fruits/vegetables some   Social Determinants of Health   Financial Resource Strain: Low Risk  (05/26/2023)   Overall Financial Resource Strain (CARDIA)    Difficulty of Paying Living Expenses: Not hard at all  Food Insecurity: No Food Insecurity (05/26/2023)   Hunger Vital Sign    Worried About Running Out of Food in the Last Year: Never true    Ran Out of Food in the Last Year: Never true  Transportation Needs: No Transportation Needs (05/26/2023)    PRAPARE - Administrator, Civil Service (Medical): No    Lack of Transportation (Non-Medical): No  Physical Activity: Insufficiently Active (05/26/2023)   Exercise Vital Sign    Days of Exercise per Week: 1 day    Minutes of Exercise per Session: 20 min  Stress: No Stress Concern Present (05/26/2023)   [employer] of Occupational Health - Occupational Stress Questionnaire    Feeling of  Stress : Not at all  Social Connections: Unknown (05/26/2023)   Social Connection and Isolation Panel [NHANES]    Frequency of Communication with Friends and Family: More than three times a week    Frequency of Social Gatherings with Friends and Family: Twice a week    Attends Religious Services: Not on Marketing executive or Organizations: Yes    Attends Banker Meetings: More than 4 times per year    Marital Status: Widowed    Tobacco Counseling Counseling given: Not Answered   Clinical Intake:  Pre-visit preparation completed: No  Pain : 0-10 Pain Score: 3  Pain Type: Chronic pain Pain Location: Generalized Pain Descriptors / Indicators: Aching Pain Onset: More than a month ago Pain Frequency: Intermittent Pain Relieving Factors: tylenol w/arthritis  Pain Relieving Factors: tylenol w/arthritis  BMI - recorded: 23.49 Nutritional Status: BMI of 19-24  Normal Nutritional Risks: None Diabetes: No  How often do you need to have someone help you when you read instructions, pamphlets, or other written materials from your doctor or pharmacy?: 1 - Never  Interpreter Needed?: No  Comments: lives alone Information entered by :: B.Kelle Ruppert,LPN   Activities of Daily Living    05/26/2023   11:27 PM  In your present state of health, do you have any difficulty performing the following activities:  Hearing? 1  Vision? 0  Difficulty concentrating or making decisions? 0  Walking or climbing stairs? 1  Dressing or bathing? 0  Doing errands, shopping?  0  Preparing Food and eating ? N  Using the Toilet? N  In the past six months, have you accidently leaked urine? N  Do you have problems with loss of bowel control? N  Managing your Medications? N  Managing your Finances? N  Housekeeping or managing your Housekeeping? N    Patient Care Team: Eustaquio Boyden, MD as PCP - General (Family Medicine) [facility], [facility]  Indicate any recent Medical Services you may have received from other than [facility] providers in the past year (date may be approximate).     Assessment:   This is a routine wellness examination for [facility].  Hearing/Vision screen Hearing Screening - Comments:: Pt says hearing is ok with aids;does not help in crowds Vision Screening - Comments:: Pt says his vision is 20/20 after cataract surgery Dr Lonell Face Vision [place]-cataract   Goals Addressed             This Visit's Progress    Eat more fruits and vegetables   On track    Starting 03/13/2018, I will continue to eat at least 4-5 servings of vegetables daily.      Patient Stated   On track    03/20/2019, Patient will try to exercise more daily        Depression Screen    05/30/2023   11:02 AM 07/31/2022   11:40 AM 05/28/2022    3:32 PM 05/24/2021    3:40 PM 05/03/2020    3:52 PM 03/20/2019   10:34 AM 03/13/2018    8:14 AM  PHQ 2/9 Scores  PHQ - 2 Score 0 0 0 0 0 0 0  PHQ- 9 Score    0 0 0 0    Fall Risk    05/26/2023   11:27 PM 07/31/2022   11:40 AM 05/28/2022    3:34 PM 05/03/2020    3:25 PM 03/20/2019   10:34 AM  Fall Risk   Falls in  the past year? 0 0 0 0 0  Number falls in past yr: 0  0  0  Injury with Fall? 0  0    Risk for fall due to : No Fall Risks  No Fall Risks  Medication side effect  Follow up Education provided;Falls prevention discussed  Falls prevention discussed  Falls evaluation completed;Falls prevention discussed    MEDICARE RISK AT HOME: Medicare Risk at Home Any stairs in or around the home?: Yes If so, are  there any without handrails?: No Home free of loose throw rugs in walkways, pet beds, electrical cords, etc?: Yes Adequate lighting in your home to reduce risk of falls?: Yes Life alert?: Yes Use of a cane, walker or w/c?: No Grab bars in the bathroom?: No Shower chair or bench in shower?: No Elevated toilet seat or a handicapped toilet?: No  TIMED UP AND GO:  Was the test performed?  No    Cognitive Function:    03/20/2019   10:38 AM 03/13/2018    8:15 AM 03/12/2017    1:30 PM  MMSE - Mini Mental State Exam  Orientation to time 5 5 5   Orientation to Place 5 5 5   Registration 3 3 3   Attention/ Calculation 5 0 0  Recall 3 3 3   Language- name 2 objects  0 0  Language- repeat 1 1 1   Language- follow 3 step command  3 3  Language- read & follow direction  0 0  Write a sentence  0 0  Copy design  0 0  Total score  20 20        05/30/2023   11:15 AM 05/28/2022    3:35 PM  6CIT Screen  What Year? 0 points 0 points  What month? 0 points 0 points  What time? 0 points 0 points  Count back from 20 0 points 0 points  Months in reverse 0 points 0 points  Repeat phrase 0 points 0 points  Total Score 0 points 0 points    Immunizations Immunization History  Administered Date(s) Administered   Fluad Quad(high Dose 65+) 03/11/2019, 03/08/2020, 03/20/2022   H1N1 07/01/2008   Influenza Split 03/29/2011, 04/25/2012   Influenza Whole 03/26/2006, 03/28/2007, 03/10/2008, 05/26/2009, 03/23/2010   Influenza, High Dose Seasonal PF 04/20/2021   Influenza, Seasonal, Injecte, Preservative Fre 04/26/2014   Influenza,inj,Quad PF,6+ Mos 04/27/2015, 04/24/2016, 03/12/2017, 03/13/2018   Influenza-Unspecified 03/25/2014   PFIZER(Purple Top)SARS-COV-2 Vaccination 07/15/2019, 08/05/2019, 03/22/2020, 10/14/2020   Pneumococcal Conjugate-13 10/04/2015   Pneumococcal Polysaccharide-23 03/12/2017   Td 11/07/2005, 08/30/2010   Tdap 08/19/2020   Zoster Recombinant(Shingrix) 09/09/2019, 11/30/2019    Zoster, Live 08/30/2010    TDAP status: Up to date  Flu Vaccine status: Up to date  Pneumococcal vaccine status: Up to date  Covid-19 vaccine status: Completed vaccines  Qualifies for Shingles Vaccine? Yes   Zostavax completed Yes   Shingrix Completed?: Yes  Screening Tests Health Maintenance  Topic Date Due   COVID-19 Vaccine (5 - 2023-24 season) 06/15/2023 (Originally 02/24/2023)   INFLUENZA VACCINE  09/23/2023 (Originally 01/24/2023)   Medicare Annual Wellness (AWV)  05/29/2024   Colonoscopy  01/27/2026   DTaP/Tdap/Td (4 - Td or Tdap) 08/19/2030   Pneumonia Vaccine 46+ Years old  Completed   Hepatitis C Screening  Completed   Zoster Vaccines- Shingrix  Completed   HPV VACCINES  Aged Out    Health Maintenance  There are no preventive care reminders to display for this patient.   Colorectal cancer screening: Type  of screening: Colonoscopy. Completed 01/27/21. Repeat every 5 years  Lung Cancer Screening: (Low Dose CT Chest recommended if Age 89-80 years, 20 pack-year currently smoking OR have quit w/in 15years.) does not qualify.   Lung Cancer Screening Referral: no  Additional Screening:  Hepatitis C Screening: does not qualify; Completed 09/27/15  Vision Screening: Recommended annual ophthalmology exams for early detection of glaucoma and other disorders of the eye. Is the patient up to date with their annual eye exam?  Yes  Who is the provider or what is the name of the office in which the patient attends annual eye exams? Dr Marcellus Scott If pt is not established with a provider, would they like to be referred to a provider to establish care? No .   Dental Screening: Recommended annual dental exams for proper oral hygiene  Diabetic Foot Exam: n/a  Community Resource Referral / Chronic Care Management: CRR required this visit?  No   CCM required this visit?  No   Plan:     I have personally reviewed and noted the following in the patient's chart:   Medical and  social history Use of alcohol, tobacco or illicit drugs  Current medications and supplements including opioid prescriptions. Patient is not currently taking opioid prescriptions. Functional ability and status Nutritional status Physical activity Advanced directives List of other physicians Hospitalizations, surgeries, and ER visits in previous 12 months Vitals Screenings to include cognitive, depression, and falls Referrals and appointments  In addition, I have reviewed and discussed with patient certain preventive protocols, quality metrics, and best practice recommendations. A written personalized care plan for preventive services as well as general preventive health recommendations were provided to patient.    Sue Lush, LPN   16/06/958   After Visit Summary: (MyChart) Due to this being a telephonic visit, the after visit summary with patients personalized plan was offered to patient via MyChart   Nurse Notes: The patient states he is doing well and has no concerns or questions at this time. Pt relays he is closing on his house 06/21/23 then moving to [place] near his children. He will be seeking another provider in network there.

## 2023-07-02 ENCOUNTER — Ambulatory Visit: Payer: Medicare PPO

## 2023-07-02 DIAGNOSIS — E538 Deficiency of other specified B group vitamins: Secondary | ICD-10-CM

## 2023-07-02 MED ORDER — CYANOCOBALAMIN 1000 MCG/ML IJ SOLN
1000.0000 ug | Freq: Once | INTRAMUSCULAR | Status: AC
  Administered 2023-07-02: 1000 ug via INTRAMUSCULAR

## 2023-07-02 NOTE — Progress Notes (Signed)
 Per orders of Dr. Rilla, injection of monthly B12 1000 mcg/ml given by Clotilda SHAUNNA Pander, CMA in [facility]. Patient tolerated injection well.  I discussed with pt that he is past due for an OV with Dr Rilla and this would be the last shot he could get without and OV and labs. He said he is in the process of moving to [facility] and will coordinate his appointment with a time that he is coming back to this area.

## 2023-07-05 ENCOUNTER — Other Ambulatory Visit: Payer: Self-pay | Admitting: Family Medicine

## 2023-07-05 NOTE — Telephone Encounter (Signed)
 Needs appt scheduled for CPE with Dr. Reece Agar. Please call patient to set up appt.

## 2023-07-05 NOTE — Telephone Encounter (Signed)
 I called pt to reschedule cpe and pt stated that he has moved out of town and has requested for his medical records to be sent to new provider

## 2023-07-10 ENCOUNTER — Other Ambulatory Visit (INDEPENDENT_AMBULATORY_CARE_PROVIDER_SITE_OTHER): Payer: Medicare PPO

## 2023-07-10 ENCOUNTER — Ambulatory Visit: Payer: Medicare PPO | Admitting: Internal Medicine

## 2023-07-10 ENCOUNTER — Encounter: Payer: Self-pay | Admitting: Internal Medicine

## 2023-07-10 VITALS — BP 130/74 | HR 84 | Ht 71.0 in | Wt 179.4 lb

## 2023-07-10 DIAGNOSIS — K5080 Crohn's disease of both small and large intestine without complications: Secondary | ICD-10-CM | POA: Diagnosis not present

## 2023-07-10 DIAGNOSIS — K50018 Crohn's disease of small intestine with other complication: Secondary | ICD-10-CM

## 2023-07-10 DIAGNOSIS — Z800 Family history of malignant neoplasm of digestive organs: Secondary | ICD-10-CM | POA: Diagnosis not present

## 2023-07-10 DIAGNOSIS — K9089 Other intestinal malabsorption: Secondary | ICD-10-CM

## 2023-07-10 DIAGNOSIS — E538 Deficiency of other specified B group vitamins: Secondary | ICD-10-CM | POA: Diagnosis not present

## 2023-07-10 LAB — BASIC METABOLIC PANEL
BUN: 16 mg/dL (ref 6–23)
CO2: 25 meq/L (ref 19–32)
Calcium: 9.3 mg/dL (ref 8.4–10.5)
Chloride: 103 meq/L (ref 96–112)
Creatinine, Ser: 0.99 mg/dL (ref 0.40–1.50)
GFR: 75.55 mL/min (ref >60.00)
Glucose, Bld: 107 mg/dL — ABNORMAL HIGH (ref 70–99)
Potassium: 4 meq/L (ref 3.5–5.1)
Sodium: 137 meq/L (ref 135–145)

## 2023-07-10 LAB — CBC WITH DIFFERENTIAL/PLATELET
Basophils Absolute: 0 10*3/uL (ref 0.0–0.1)
Basophils Relative: 0.7 % (ref 0.0–3.0)
Eosinophils Absolute: 0.3 10*3/uL (ref 0.0–0.7)
Eosinophils Relative: 5.3 % — ABNORMAL HIGH (ref 0.0–5.0)
HCT: 42.2 % (ref 39.0–52.0)
Hemoglobin: 14.4 g/dL (ref 13.0–17.0)
Lymphocytes Relative: 14.3 % (ref 12.0–46.0)
Lymphs Abs: 0.8 10*3/uL (ref 0.7–4.0)
MCHC: 34.2 g/dL (ref 30.0–36.0)
MCV: 94 fL (ref 78.0–100.0)
Monocytes Absolute: 0.6 10*3/uL (ref 0.1–1.0)
Monocytes Relative: 10 % (ref 3.0–12.0)
Neutro Abs: 4.1 10*3/uL (ref 1.4–7.7)
Neutrophils Relative %: 69.7 % (ref 43.0–77.0)
Platelets: 257 10*3/uL (ref 150.0–400.0)
RBC: 4.49 Mil/uL (ref 4.22–5.81)
RDW: 12.9 % (ref 11.5–15.5)
WBC: 5.9 10*3/uL (ref 4.0–10.5)

## 2023-07-10 LAB — HEPATIC FUNCTION PANEL
ALT: 17 U/L (ref 0–53)
AST: 17 U/L (ref 0–37)
Albumin: 4.7 g/dL (ref 3.5–5.2)
Alkaline Phosphatase: 53 U/L (ref 39–117)
Bilirubin, Direct: 0.2 mg/dL (ref 0.0–0.3)
Total Bilirubin: 1 mg/dL (ref 0.2–1.2)
Total Protein: 7.1 g/dL (ref 6.0–8.3)

## 2023-07-10 LAB — TSH: TSH: 2.44 u[IU]/mL (ref 0.35–5.50)

## 2023-07-10 MED ORDER — CYANOCOBALAMIN 1000 MCG/ML IJ SOLN: 1000.0000 ug | INTRAMUSCULAR | 11 refills | Status: AC

## 2023-07-10 MED ORDER — COLESTIPOL HCL 1 G PO TABS: ORAL_TABLET | ORAL | 1 refills | Status: DC

## 2023-07-10 NOTE — Progress Notes (Signed)
 HISTORY OF PRESENT ILLNESS:  Paul Atkinson is a 73 y.o. male with a history of Crohn's ileocolitis for which he is status post remote ileocecectomy at [facility] greater than 20 years ago. He also has a history of postoperative bile salt induced diarrhea for which he is on Colestid  and B12 deficiency for which he is on replacement.  On January 27, 2021 when he underwent colonoscopy after having had recent problems with abdominal pain and obstructive symptoms. Colonoscopy revealed superficial ulceration at the ileocolonic anastomosis and noncritical narrowing of the anastomosis. The visualized Neo ileum was unremarkable as was the colon. His symptoms were felt secondary to adhesions.  He was last seen in the office March 30, 2022.  At that time he was feeling well with no recurrent issues with abdominal pain.  His medications were refilled and follow-up at this time recommended.  He is pleased to report that he continues to feel well.  No abdominal pain.  Continues to use Colestid  to manage his bile salt related diarrhea.  Ongoing B12 replacement.  He is moving to [place].  Getting a new PCP.  Has not had his annual blood work which we rely on when following his condition.  He does request refill of his medications and wonders if he went on his blood work while he is here today.Aaron Aas   REVIEW OF SYSTEMS:  All non-GI ROS negative except for arthritis, skin rash, hearing problems  Past Medical History:  Diagnosis Date   Allergy    mild   Anemia    iron def   Basal cell carcinoma    back, chest    Bile salt-induced diarrhea    Crohn disease (HCC)    status post ileocecectomy 1989   Disorder of inner ear    Hearing loss    Hx of duodenal ulcer    Hx SBO    Hypertension    Kidney stones    Malabsorption    Psoriasis    Ulcer    Vitamin B 12 deficiency    secondary to ileal resection    Past Surgical History:  Procedure Laterality Date   CATARACT EXTRACTION, BILATERAL     6/23, 7/23    COLONOSCOPY  07/2016   patent anastomosis, no polyps, rpt 5 yrs Elvin Hammer)   COLONOSCOPY  01/27/2021   superficial ulceration at ileocolonic anastomosis- benign biopsy, rpt 5 yrs Elvin Hammer)   ILEOCECETOMY  1989   peritonitis with SBO from crohn's   KIDNEY STONE SURGERY     removal   MOLE REMOVAL     back   MOUTH SURGERY     SEPTOPLASTY  05/2004   due to deviated septum (Dr. Franky Ivanoff)    Social History Annamae Kick  reports that he has never smoked. He has never used smokeless tobacco. He reports that he does not drink alcohol and does not use drugs.  family history includes CAD (age of onset: 24) in his father; Diabetes (age of onset: 89) in his father; Heart disease in his mother; Hyperlipidemia in his father; Hypertension in his mother; Kidney disease in his father and mother; Rectal cancer in his sister.  Allergies  Allergen Reactions   Penicillins     REACTION: RASH in throat       PHYSICAL EXAMINATION: Vital signs: BP 130/74 (BP Location: Left Arm, Patient Position: Sitting, Cuff Size: Large)   Pulse 84   Ht 5\' 11"  (1.803 m)   Wt 179 lb 6 oz (81.4 kg)   BMI  25.02 kg/m   Constitutional: generally well-appearing, no acute distress Psychiatric: alert and oriented x3, cooperative Eyes: extraocular movements intact, anicteric, conjunctiva pink Mouth: oral pharynx moist, no lesions Neck: supple no lymphadenopathy Cardiovascular: heart regular rate and rhythm, no murmur Lungs: clear to auscultation bilaterally Abdomen: soft, nontender, nondistended, no obvious ascites, no peritoneal signs, normal bowel sounds, no organomegaly.  Prior surgical incisions well-healed Rectal: Omitted Extremities: no clubbing, cyanosis, or lower extremity edema bilaterally Skin: no lesions on visible extremities Neuro: No focal deficits.  Renal nerves intact  ASSESSMENT:   1.  History of Crohn's ileocolitis status post remote ileocecectomy.  Asymptomatic. 2.  Bile salt induced diarrhea  controlled with Colestid  3.  B12 deficiency.  On replacement. 4.  Sister with rectal cancer 5.  Surveillance colonoscopy August 2022 as described 6.  Infrequent problems with transient obstructive symptoms consistent with adhesive disease.  No issues over the past year     PLAN:   1.  Continue Colestid .  Prescription refilled 2.  Continue B12 replacement.  Refilled 3.  Surveillance colonoscopy around August 2027 4.  Blood work today including CBC, comprehensive metabolic panel, lipid panel, TSH, and B12 level 5.  Routine office follow-up 1 year.  Contact the office in the interim for any questions or problems

## 2023-07-10 NOTE — Patient Instructions (Signed)
 Your provider has requested that you go to the basement level for lab work before leaving today. Press "B" on the elevator. The lab is located at the first door on the left as you exit the elevator.  We have sent the following medications to your pharmacy for you to pick up at your convenience:  Colestid , B12

## 2023-07-11 ENCOUNTER — Other Ambulatory Visit: Payer: Self-pay | Admitting: Family Medicine

## 2023-07-11 NOTE — Telephone Encounter (Signed)
Duplicate request.   Pt no longer under Dr Timoteo Expose care. Pt has moved (see 07/05/23 phn note) and signed medical release form on 07/02/23 to have records faxed to new PCP.   Request denied.

## 2023-08-19 ENCOUNTER — Encounter: Payer: Medicare PPO | Admitting: Dermatology

## 2023-08-21 DIAGNOSIS — Z125 Encounter for screening for malignant neoplasm of prostate: Secondary | ICD-10-CM | POA: Diagnosis not present

## 2023-08-21 DIAGNOSIS — K5000 Crohn's disease of small intestine without complications: Secondary | ICD-10-CM | POA: Diagnosis not present

## 2023-08-21 DIAGNOSIS — Z131 Encounter for screening for diabetes mellitus: Secondary | ICD-10-CM | POA: Diagnosis not present

## 2023-08-21 DIAGNOSIS — L409 Psoriasis, unspecified: Secondary | ICD-10-CM | POA: Diagnosis not present

## 2023-08-21 DIAGNOSIS — Z1159 Encounter for screening for other viral diseases: Secondary | ICD-10-CM | POA: Diagnosis not present

## 2023-08-21 DIAGNOSIS — M79672 Pain in left foot: Secondary | ICD-10-CM | POA: Diagnosis not present

## 2023-08-21 DIAGNOSIS — Z0000 Encounter for general adult medical examination without abnormal findings: Secondary | ICD-10-CM | POA: Diagnosis not present

## 2023-08-21 DIAGNOSIS — I10 Essential (primary) hypertension: Secondary | ICD-10-CM | POA: Diagnosis not present

## 2023-08-21 DIAGNOSIS — Z136 Encounter for screening for cardiovascular disorders: Secondary | ICD-10-CM | POA: Diagnosis not present

## 2023-08-27 DIAGNOSIS — E538 Deficiency of other specified B group vitamins: Secondary | ICD-10-CM | POA: Diagnosis not present

## 2023-09-27 DIAGNOSIS — E538 Deficiency of other specified B group vitamins: Secondary | ICD-10-CM | POA: Diagnosis not present

## 2023-10-29 DIAGNOSIS — E538 Deficiency of other specified B group vitamins: Secondary | ICD-10-CM | POA: Diagnosis not present

## 2023-11-28 DIAGNOSIS — E538 Deficiency of other specified B group vitamins: Secondary | ICD-10-CM | POA: Diagnosis not present

## 2023-12-30 DIAGNOSIS — E538 Deficiency of other specified B group vitamins: Secondary | ICD-10-CM | POA: Diagnosis not present

## 2024-01-02 ENCOUNTER — Other Ambulatory Visit: Payer: Self-pay | Admitting: Internal Medicine

## 2024-01-02 DIAGNOSIS — N200 Calculus of kidney: Secondary | ICD-10-CM | POA: Diagnosis not present

## 2024-01-02 DIAGNOSIS — N401 Enlarged prostate with lower urinary tract symptoms: Secondary | ICD-10-CM | POA: Diagnosis not present

## 2024-01-02 DIAGNOSIS — R351 Nocturia: Secondary | ICD-10-CM | POA: Diagnosis not present

## 2024-01-29 DIAGNOSIS — E538 Deficiency of other specified B group vitamins: Secondary | ICD-10-CM | POA: Diagnosis not present

## 2024-02-12 DIAGNOSIS — E538 Deficiency of other specified B group vitamins: Secondary | ICD-10-CM | POA: Diagnosis not present

## 2024-03-04 DIAGNOSIS — E538 Deficiency of other specified B group vitamins: Secondary | ICD-10-CM | POA: Diagnosis not present

## 2024-04-01 DIAGNOSIS — E538 Deficiency of other specified B group vitamins: Secondary | ICD-10-CM | POA: Diagnosis not present

## 2024-04-01 DIAGNOSIS — Z23 Encounter for immunization: Secondary | ICD-10-CM | POA: Diagnosis not present

## 2024-04-03 DIAGNOSIS — L400 Psoriasis vulgaris: Secondary | ICD-10-CM | POA: Diagnosis not present

## 2024-04-03 DIAGNOSIS — D225 Melanocytic nevi of trunk: Secondary | ICD-10-CM | POA: Diagnosis not present

## 2024-04-03 DIAGNOSIS — L821 Other seborrheic keratosis: Secondary | ICD-10-CM | POA: Diagnosis not present

## 2024-04-29 DIAGNOSIS — E538 Deficiency of other specified B group vitamins: Secondary | ICD-10-CM | POA: Diagnosis not present

## 2024-05-19 DIAGNOSIS — M25562 Pain in left knee: Secondary | ICD-10-CM | POA: Diagnosis not present

## 2024-05-19 DIAGNOSIS — M1712 Unilateral primary osteoarthritis, left knee: Secondary | ICD-10-CM | POA: Diagnosis not present

## 2024-05-29 DIAGNOSIS — M23232 Derangement of other medial meniscus due to old tear or injury, left knee: Secondary | ICD-10-CM | POA: Diagnosis not present

## 2024-05-29 DIAGNOSIS — E538 Deficiency of other specified B group vitamins: Secondary | ICD-10-CM | POA: Diagnosis not present

## 2024-05-29 DIAGNOSIS — M1712 Unilateral primary osteoarthritis, left knee: Secondary | ICD-10-CM | POA: Diagnosis not present

## 2024-06-03 ENCOUNTER — Telehealth: Payer: Self-pay | Admitting: Internal Medicine

## 2024-06-03 NOTE — Telephone Encounter (Signed)
 Pt would like to have labs done about a week prior to his annual visit. Please advise what labs you would like pt to have drawn

## 2024-06-03 NOTE — Telephone Encounter (Signed)
 Left message for pt to call back

## 2024-06-03 NOTE — Telephone Encounter (Signed)
 CBC, c-Met, B12 level, CRP  Thanks

## 2024-06-03 NOTE — Telephone Encounter (Signed)
 Patient called wishing to schedule yearly follow up visit. Patient states he now lives in [place]. Requesting to know if he is able to have labs completed a few weeks prior to appointment on 2/3. Please advise, thank you

## 2024-06-10 NOTE — Telephone Encounter (Signed)
 Spoke with pt and he will come for labs prior to appt, orders in epic, he knows he does not need an appt for labs.

## 2024-06-30 ENCOUNTER — Other Ambulatory Visit: Payer: Self-pay | Admitting: Internal Medicine

## 2024-07-02 ENCOUNTER — Other Ambulatory Visit (INDEPENDENT_AMBULATORY_CARE_PROVIDER_SITE_OTHER)

## 2024-07-02 ENCOUNTER — Ambulatory Visit: Payer: Self-pay | Admitting: Internal Medicine

## 2024-07-02 DIAGNOSIS — E538 Deficiency of other specified B group vitamins: Secondary | ICD-10-CM

## 2024-07-02 DIAGNOSIS — K50018 Crohn's disease of small intestine with other complication: Secondary | ICD-10-CM

## 2024-07-02 LAB — CBC WITH DIFFERENTIAL/PLATELET
Basophils Absolute: 0 K/uL (ref 0.0–0.1)
Basophils Relative: 0.6 % (ref 0.0–3.0)
Eosinophils Absolute: 0.2 K/uL (ref 0.0–0.7)
Eosinophils Relative: 4.4 % (ref 0.0–5.0)
HCT: 40.6 % (ref 39.0–52.0)
Hemoglobin: 14.1 g/dL (ref 13.0–17.0)
Lymphocytes Relative: 18.3 % (ref 12.0–46.0)
Lymphs Abs: 0.8 K/uL (ref 0.7–4.0)
MCHC: 34.6 g/dL (ref 30.0–36.0)
MCV: 93.7 fl (ref 78.0–100.0)
Monocytes Absolute: 0.6 K/uL (ref 0.1–1.0)
Monocytes Relative: 13.4 % — ABNORMAL HIGH (ref 3.0–12.0)
Neutro Abs: 2.6 K/uL (ref 1.4–7.7)
Neutrophils Relative %: 63.3 % (ref 43.0–77.0)
Platelets: 223 K/uL (ref 150.0–400.0)
RBC: 4.33 Mil/uL (ref 4.22–5.81)
RDW: 13.1 % (ref 11.5–15.5)
WBC: 4.2 K/uL (ref 4.0–10.5)

## 2024-07-02 LAB — COMPREHENSIVE METABOLIC PANEL WITH GFR
ALT: 16 U/L (ref 3–53)
AST: 18 U/L (ref 5–37)
Albumin: 4.6 g/dL (ref 3.5–5.2)
Alkaline Phosphatase: 44 U/L (ref 39–117)
BUN: 14 mg/dL (ref 6–23)
CO2: 28 meq/L (ref 19–32)
Calcium: 8.8 mg/dL (ref 8.4–10.5)
Chloride: 103 meq/L (ref 96–112)
Creatinine, Ser: 0.87 mg/dL (ref 0.40–1.50)
GFR: 84.99 mL/min
Glucose, Bld: 117 mg/dL — ABNORMAL HIGH (ref 70–99)
Potassium: 4.1 meq/L (ref 3.5–5.1)
Sodium: 138 meq/L (ref 135–145)
Total Bilirubin: 1 mg/dL (ref 0.2–1.2)
Total Protein: 7.2 g/dL (ref 6.0–8.3)

## 2024-07-02 LAB — VITAMIN B12: Vitamin B-12: 632 pg/mL (ref 211–911)

## 2024-07-02 LAB — C-REACTIVE PROTEIN: CRP: <0.5 mg/dL — ABNORMAL LOW (ref 1.0–20.0)

## 2024-07-28 ENCOUNTER — Ambulatory Visit: Admitting: Internal Medicine

## 2024-09-15 ENCOUNTER — Ambulatory Visit: Admitting: Internal Medicine
